# Patient Record
Sex: Female | Born: 1955 | Race: White | Hispanic: No | Marital: Married | Smoking: Never smoker
Health system: Southern US, Community
[De-identification: ages and names within clinical notes are randomized; demographics above are authoritative.]

## PROBLEM LIST (undated history)

## (undated) DIAGNOSIS — N390 Urinary tract infection, site not specified: Secondary | ICD-10-CM

## (undated) DIAGNOSIS — B2799 Infectious mononucleosis, unspecified with other complication: Secondary | ICD-10-CM

## (undated) DIAGNOSIS — M8580 Other specified disorders of bone density and structure, unspecified site: Secondary | ICD-10-CM

## (undated) DIAGNOSIS — B178 Other specified acute viral hepatitis: Secondary | ICD-10-CM

## (undated) DIAGNOSIS — Z780 Asymptomatic menopausal state: Secondary | ICD-10-CM

## (undated) DIAGNOSIS — Z86010 Personal history of colonic polyps: Secondary | ICD-10-CM

## (undated) DIAGNOSIS — K219 Gastro-esophageal reflux disease without esophagitis: Secondary | ICD-10-CM

## (undated) DIAGNOSIS — M816 Localized osteoporosis [Lequesne]: Secondary | ICD-10-CM

## (undated) DIAGNOSIS — E7800 Pure hypercholesterolemia, unspecified: Secondary | ICD-10-CM

## (undated) DIAGNOSIS — K5900 Constipation, unspecified: Secondary | ICD-10-CM

## (undated) DIAGNOSIS — I10 Essential (primary) hypertension: Secondary | ICD-10-CM

## (undated) DIAGNOSIS — E042 Nontoxic multinodular goiter: Secondary | ICD-10-CM

## (undated) DIAGNOSIS — J4520 Mild intermittent asthma, uncomplicated: Secondary | ICD-10-CM

## (undated) DIAGNOSIS — R911 Solitary pulmonary nodule: Secondary | ICD-10-CM

## (undated) DIAGNOSIS — M1990 Unspecified osteoarthritis, unspecified site: Secondary | ICD-10-CM

## (undated) DIAGNOSIS — K648 Other hemorrhoids: Secondary | ICD-10-CM

## (undated) DIAGNOSIS — C3490 Malignant neoplasm of unspecified part of unspecified bronchus or lung: Secondary | ICD-10-CM

## (undated) HISTORY — DX: Urinary tract infection, site not specified: N39.0

## (undated) HISTORY — DX: Pure hypercholesterolemia, unspecified: E78.00

## (undated) HISTORY — DX: Mild intermittent asthma, uncomplicated: J45.20

## (undated) HISTORY — PX: OTHER SURGICAL HISTORY: SHX169

## (undated) HISTORY — DX: Gastro-esophageal reflux disease without esophagitis: K21.9

## (undated) HISTORY — DX: Personal history of colonic polyps: Z86.010

## (undated) HISTORY — DX: Malignant neoplasm of unspecified part of unspecified bronchus or lung: C34.90

## (undated) HISTORY — DX: Essential (primary) hypertension: I10

## (undated) HISTORY — DX: Other specified acute viral hepatitis: B17.8

## (undated) HISTORY — PX: JOINT REPLACEMENT: SHX530

## (undated) HISTORY — DX: Infectious mononucleosis, unspecified with other complication: B27.99

## (undated) HISTORY — DX: Unspecified osteoarthritis, unspecified site: M19.90

## (undated) HISTORY — DX: Other hemorrhoids: K64.8

## (undated) HISTORY — PX: COLONOSCOPY W/ POLYPECTOMY: SHX1380

## (undated) HISTORY — DX: Solitary pulmonary nodule: R91.1

## (undated) HISTORY — DX: Other specified disorders of bone density and structure, unspecified site: M85.80

## (undated) HISTORY — PX: EYE SURGERY: SHX253

## (undated) HISTORY — DX: Asymptomatic menopausal state: Z78.0

## (undated) HISTORY — DX: Nontoxic multinodular goiter: E04.2

## (undated) HISTORY — DX: Constipation, unspecified: K59.00

## (undated) HISTORY — PX: HEMORRHOID BANDING: SHX5850

---

## 2000-02-14 HISTORY — PX: LASIK: SHX215

## 2004-02-14 HISTORY — PX: ENDOMETRIAL BIOPSY: SHX622

## 2010-04-14 DIAGNOSIS — Z780 Asymptomatic menopausal state: Secondary | ICD-10-CM

## 2010-04-14 HISTORY — DX: Asymptomatic menopausal state: Z78.0

## 2011-01-17 ENCOUNTER — Telehealth: Payer: Self-pay | Admitting: Family Medicine

## 2011-01-17 ENCOUNTER — Ambulatory Visit (INDEPENDENT_AMBULATORY_CARE_PROVIDER_SITE_OTHER): Payer: BC Managed Care – PPO | Admitting: Family Medicine

## 2011-01-17 ENCOUNTER — Encounter: Payer: Self-pay | Admitting: Family Medicine

## 2011-01-17 VITALS — BP 128/80 | HR 79 | Temp 98.0°F | Ht 65.75 in | Wt 170.0 lb

## 2011-01-17 DIAGNOSIS — J069 Acute upper respiratory infection, unspecified: Secondary | ICD-10-CM

## 2011-01-17 DIAGNOSIS — H6120 Impacted cerumen, unspecified ear: Secondary | ICD-10-CM

## 2011-01-17 MED ORDER — HYDROCODONE-HOMATROPINE 5-1.5 MG/5ML PO SYRP: ORAL_SOLUTION | ORAL | Status: DC

## 2011-01-17 NOTE — Assessment & Plan Note (Signed)
No sign of bacterial infection at this time. Trial of nonsedating antihistamine + decongestant like allegra D. May use OTC nasal saline spray or irrigation solution bid. May try off brand afrin prn for severe nasal congestion.  Therapeutic expectations and side effect profile of medication discussed today.  Patient's questions answered. Hycodan rx given for severe sx's, stressed importance of saving this for night time mostly, warned about possible sedation/impairment on this type of med. Call or return if not improving in 5-6 days.

## 2011-01-17 NOTE — Assessment & Plan Note (Signed)
Successfully removed with curette today.

## 2011-01-17 NOTE — Progress Notes (Signed)
Office Note 01/17/2011  CC:  Chief Complaint  Patient presents with  . Sinusitis    fever last night    HPI:  Andrea Newton is a 55 y.o. White female who is here to establish care and discuss respiratory symptoms. Patient's most recent primary MD: Dr. Christell Constant at [facility].  OB/GYN care at [facility] [place] Old records were not reviewed prior to or during today's visit.  Pt presents complaining of respiratory symptoms for 2  days.  Mostly nasal congestion/runny nose, sneezing, and "mild" PND cough.  Subjective fever last night.  Worst symptoms seems to be the headaches and excessive nasal congestion/mucous.  Had a bad day yesterday, felt like face/ears/head hurt a lot and had lots of pressure.   Lately the symptoms seem to be improving some (feels better this morning). No fevers, no wheezing, and no SOB.  No pain in face or teeth.  No significant HA.  ST mild at most.  Symptoms made worse by nothing.  Symptoms improved by sitting up, taking ibuprofen and tylenol. Smoker? no Recent sick contact? None known but she works in an Engineer, [employer]. Muscle or joint aches? no She did get flu vaccine this season (> 2 wks ago).  ROS: no n/v/d or abdominal pain.  No rash.  No neck stiffness.   +Mild fatigue.  +Mild appetite loss.   Past Medical History  Diagnosis Date  . Multiple thyroid nodules 2010 approx    Euthyroid.  Saw ENDO (Dr. Jimmye Norman).  . Infectious mononucleosis hepatitis college  . Osteoarthritis     knees>hips.  Takes no meds.    Past Surgical History  Procedure Date  . Lasik 2002    Family History  Problem Relation Age of Onset  . Arthritis Mother     Osteo (bilat hip replacements)  . Cancer Maternal Grandmother     ovarian  . Heart disease Maternal Grandmother   . Stroke Maternal Grandmother     History   Social History  . Marital Status: Married    Spouse Name: N/A    Number of Children: N/A  . Years of Education: N/A    Occupational History  . Not on file.   Social History Main Topics  . Smoking status: Never Smoker   . Smokeless tobacco: Never Used  . Alcohol Use: Yes  . Drug Use: No  . Sexually Active: Not on file   Other Topics Concern  . 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  No siblings.No exercise but "active".    Outpatient Encounter Prescriptions as of 01/17/2011  Medication Sig Dispense Refill  . cholecalciferol (VITAMIN D) 1000 UNITS tablet Take 1,000 Units by mouth daily.        Marland Kitchen HYDROcodone-homatropine (HYCODAN) 5-1.5 MG/5ML syrup 1-2 tsp po q6h prn for cough/cold/achiness  120 mL  0  . Multiple Vitamin (MULTIVITAMIN) tablet Take 1 tablet by mouth daily.        . phentermine 37.5 MG capsule Take 37.5 mg by mouth every morning.        . vitamin B-12 (CYANOCOBALAMIN) 1000 MCG tablet Take 1,000 mcg by mouth daily.          Allergies  Allergen Reactions  . Penicillins Rash    ROS Review of  Systems  Constitutional: Positive for fever, appetite change and fatigue.  HENT: Negative for sore throat.        See HPI  Eyes: Negative for discharge, itching and visual disturbance.  Respiratory: Positive for cough. Negative for chest tightness, shortness of breath and wheezing.   Cardiovascular: Negative for chest pain.  Gastrointestinal: Negative for nausea, vomiting, abdominal pain and diarrhea.  Genitourinary: Negative for dysuria.  Musculoskeletal: Negative for back pain and joint swelling.  Skin: Negative for rash.  Neurological: Positive for headaches. Negative for dizziness and weakness.  Hematological: Negative for adenopathy.  Psychiatric/Behavioral: Negative for confusion.    PE; Blood pressure 128/80, pulse 79, temperature 98 F  (36.7 C), temperature source Oral, height 5' 5.75" (1.67 m), weight 170 lb (77.111 kg), last menstrual period 02/13/2010, SpO2 97.00%. VS: noted--normal. Gen: alert, NAD, NONTOXIC APPEARING. HEENT: eyes without injection, drainage, or swelling.  Ears: EACs: right side with 95% cerumen impaction--cleared this successfully today with curette.  TM and canal normal.  Left EAC normal.   TMs with normal light reflex and landmarks.  Nose: No visible rhinorrhea or crusty exudate.  Mucosa is pink and not injected.  No focal lesion.  No purulent d/c.  Mild discomfort to palpation in maxillary sinus areas bilat.  No frontal sinus TTP.Marland Kitchen  No facial swelling.  Throat and mouth without focal lesion.  No pharyngial swelling, erythema, or exudate.   Neck: supple, no LAD.   LUNGS: CTA bilat, nonlabored resps.   CV: RRR, no m/r/g. EXT: no c/c/e SKIN: no rash  Pertinent labs:  none  ASSESSMENT AND PLAN:   New patient: obtain old records.  Viral URI No sign of bacterial infection at this time. Trial of nonsedating antihistamine + decongestant like allegra D. May use OTC nasal saline spray or irrigation solution bid. May try off brand afrin prn for severe nasal congestion.  Therapeutic expectations and side effect profile of medication discussed today.  Patient's questions answered. Hycodan rx given for severe sx's, stressed importance of saving this for night time mostly, warned about possible sedation/impairment on this type of med. Call or return if not improving in 5-6 days.   Cerumen impaction Successfully removed with curette today.      She gets annual screening labs, mammograms, and pap smears through her GYN (last pap 01/2010, last mammo 01/2010.   She is going through menopause: LMP 02/2010.   She'll get bone densitometry through her GYN (hasn't had one yet).  She is also planning on getting her first screening colonoscopy in 2013.  Return if symptoms worsen or fail to improve.

## 2011-01-17 NOTE — Patient Instructions (Signed)
Buy store brand/generic allegra D OR Zyrtec D OR claritin D and take as directed. Use OTC nasal saline spray several times per day to clear nose out. Use generic afrin (oxymetazalone) at night for nasal congestion. Call or return if not improving in 5-6 days.

## 2011-01-17 NOTE — Telephone Encounter (Signed)
Pls request records from [facility] (Dr. Christell Constant) and from [facility] in [place]  Thx.

## 2011-01-25 ENCOUNTER — Encounter: Payer: Self-pay | Admitting: Family Medicine

## 2011-01-26 ENCOUNTER — Telehealth: Payer: Self-pay | Admitting: Family Medicine

## 2011-01-26 ENCOUNTER — Encounter: Payer: Self-pay | Admitting: Family Medicine

## 2011-01-26 ENCOUNTER — Ambulatory Visit (INDEPENDENT_AMBULATORY_CARE_PROVIDER_SITE_OTHER): Payer: BC Managed Care – PPO | Admitting: Family Medicine

## 2011-01-26 VITALS — BP 135/83 | HR 82 | Temp 98.1°F | Resp 16

## 2011-01-26 DIAGNOSIS — J209 Acute bronchitis, unspecified: Secondary | ICD-10-CM

## 2011-01-26 MED ORDER — HYDROCODONE-HOMATROPINE 5-1.5 MG/5ML PO SYRP: ORAL_SOLUTION | ORAL | Status: AC

## 2011-01-26 MED ORDER — ALBUTEROL SULFATE (2.5 MG/3ML) 0.083% IN NEBU
2.5000 mg | INHALATION_SOLUTION | RESPIRATORY_TRACT | Status: AC
  Administered 2011-01-26: 2.5 mg via RESPIRATORY_TRACT

## 2011-01-26 MED ORDER — AZITHROMYCIN 250 MG PO TABS: ORAL_TABLET | ORAL | Status: DC

## 2011-01-26 MED ORDER — ALBUTEROL SULFATE (2.5 MG/3ML) 0.083% IN NEBU: 2.5000 mg | INHALATION_SOLUTION | Freq: Four times a day (QID) | RESPIRATORY_TRACT | Status: DC | PRN

## 2011-01-26 MED ORDER — PREDNISONE 20 MG PO TABS: ORAL_TABLET | ORAL | Status: DC

## 2011-01-26 NOTE — Telephone Encounter (Signed)
RC to pt.  She states she has been doing all the things recommended to her last week.  She began to feel better, but since Sunday she is feeling worse.  She has been hoarse or no voice since Sunday.  She has productive cough with yellow phlegm.  Chest tightness and hurts occasionally.  ? Fever on Sunday.  OK to call in antibiotics or does patient need office visit.  Please advise.

## 2011-01-26 NOTE — Telephone Encounter (Signed)
Patient is coughing a lot and hasn't had much of a voice for 3 days, she feels like this has moved into her chest, is there anything else she can take?

## 2011-01-26 NOTE — Progress Notes (Signed)
OFFICE NOTE  01/26/2011  CC:  Chief Complaint  Patient presents with  . URI    Productive Cough x 3-4 days. Sudafed and mucinex not helping.     HPI: Patient is a 55 y.o. Caucasian female who is here for ongoing respiratory illness. About 2 wks of symptoms now.  Initially was more upper resp, now this is much improved and she has felt worsening chest tightness, cough, lost her voice, some question of fevers vs postmenopausal hot flashes.  Mild upset stomach-?from meds?.  No vomiting, no diarrhea.   No rash.  No ST.  Mild HA from excessive coughing.   She doesn't smoke.     Pertinent PMH:  No hx of asthma or COPD She did get her flu vaccine this season.  Pertinent Meds: Hycodan susp prn, nonsedating antihist OTC +decong, nasal sprays  PE: Blood pressure 135/83, pulse 82, temperature 98.1 F (36.7 C), temperature source Oral, resp. rate 16, last menstrual period 02/13/2010, SpO2 97.00%. VS: noted--normal. Gen: alert, NAD, NONTOXIC APPEARING. HEENT: eyes without injection, drainage, or swelling.  Ears: EACs clear, TMs with normal light reflex and landmarks.  Nose: Clear rhinorrhea, with some dried, crusty exudate adherent to mildly injected mucosa.  No purulent d/c.  No paranasal sinus TTP.  No facial swelling.  Throat and mouth without focal lesion.  No pharyngial swelling, erythema, or exudate.   Neck: supple, no LAD.   LUNGS: CTA bilat, nonlabored resps.  Diminished aeration diffusely, with mild post-exhalation coughing.  No crackles and no wheezing heard. CV: RRR, no m/r/g. EXT: no c/c/e SKIN: no rash    IMPRESSION AND PLAN: Laryngotracheobronchitis. Albut neb 2.5mg  in office today: mildly helpful after 10-15 min. Plan is for albut 2.5mg  neb q6h prn (has neb machine at home for son), prednisone 40mg  qd x 5d, and z-pack. We discussed the uncertainty of viral vs atypical bacterial infection in illnesses like these, and ultimately decided to give the z-pack rx to hold and  fill if she wasn't feeling signif improvement in a few days on the steroids/nebs alone. Hycodan syrup RFx1. Therapeutic expectations and side effect profile of medication discussed today.  Patient's questions answered.   FOLLOW UP: prn

## 2011-01-26 NOTE — Telephone Encounter (Signed)
Please call pt and encourage office visit for recheck.  Thx

## 2011-01-26 NOTE — Telephone Encounter (Signed)
Pt notified and will come in today at 3:15.

## 2012-10-23 ENCOUNTER — Ambulatory Visit: Payer: BC Managed Care – PPO | Admitting: Family Medicine

## 2012-10-24 ENCOUNTER — Ambulatory Visit: Payer: BC Managed Care – PPO | Admitting: Family Medicine

## 2012-10-24 ENCOUNTER — Encounter: Payer: Self-pay | Admitting: Nurse Practitioner

## 2012-10-24 ENCOUNTER — Ambulatory Visit (INDEPENDENT_AMBULATORY_CARE_PROVIDER_SITE_OTHER): Payer: BC Managed Care – PPO | Admitting: Nurse Practitioner

## 2012-10-24 VITALS — BP 144/81 | HR 84 | Temp 99.5°F | Resp 16 | Ht 67.5 in | Wt 175.0 lb

## 2012-10-24 DIAGNOSIS — R5381 Other malaise: Secondary | ICD-10-CM

## 2012-10-24 DIAGNOSIS — E049 Nontoxic goiter, unspecified: Secondary | ICD-10-CM

## 2012-10-24 LAB — CBC
MCH: 30.1 pg (ref 26.0–34.0)
MCHC: 33.2 g/dL (ref 30.0–36.0)
Platelets: 275 10*3/uL (ref 150–400)

## 2012-10-24 NOTE — Progress Notes (Signed)
Subjective:     Andrea Newton is a 57 y.o. female who presents for evaluation of fatigue. Symptoms began several days ago. The patient feels the fatigue may be associated to not sleeping well, going back to work, mother's death-1 year anniversary coming up. Symptoms of her fatigue have been general malaise and difficulty getting through the day. Patient describes the following psychological symptoms: stress at work, stress in the family and stress related to recent death of loved one. Patient denies change in hair texture, cold intolerance, constipation, fever and GI blood loss. Possibly associated: loose stools this week. 15 pound weight gain in last year. Symptoms have been persistent this week.. Symptom severity: struggles to carry out day to day responsibilities.. Previous visits for this problem: none.   The following portions of the patient's history were reviewed and updated as appropriate: allergies, current medications, past family history, past medical history, past social history and problem list.  Review of Systems Constitutional: positive for malaise, negative for anorexia, fevers, night sweats and weight loss Eyes: negative for visual disturbance Ears, nose, mouth, throat, and face: negative for nasal congestion and sore throat Respiratory: negative for cough, dyspnea on exertion, pleurisy/chest pain, sputum and wheezing Cardiovascular: negative for chest pain, chest pressure/discomfort, fatigue, irregular heart beat, lower extremity edema and near-syncope Gastrointestinal: positive for loose stools this week, negative for abdominal pain and reflux symptoms Behavioral/Psych: positive for anxiety, depression, increased appetite and sleep disturbance, negative for bad mood, excessive alcohol consumption, irritability and tobacco use Endocrine: positive for history of goiter, negative for temperature intolerance    Objective:    BP 144/81  Pulse 84  Temp(Src) 99.5 F (37.5 C)  (Temporal)  Resp 16  Ht 5' 7.5" (1.715 m)  Wt 175 lb (79.379 kg)  BMI 26.99 kg/m2  SpO2 98%  LMP 02/13/2010 General appearance: alert, cooperative, appears stated age and no distress Head: Normocephalic, without obvious abnormality, atraumatic Eyes: negative findings: lids and lashes normal, conjunctivae and sclerae normal and corneas clear Throat: lips, mucosa, and tongue normal; teeth and gums normal Neck: no adenopathy, no carotid bruit, supple, symmetrical, trachea midline and thyroid: enlarged Lungs: clear to auscultation bilaterally Heart: regular rate and rhythm, S1, S2 normal, no murmur, click, rub or gallop Lymph nodes: Cervical, supraclavicular, and axillary nodes normal.    Assessment:   Fatigue, possible organic cause-has goiter. May be r/t stress of going back to work, not sleeping well, anxious about family events coming up including 1 year anniversary of mother's death. Insomnia, melatonin not effective   Plan:   Labs pending (check thyroid, blood count, renal & hepatic, screen for vit D deficiency, DM, lipids) Sleep hygiene, Tylenol PM PRN

## 2012-10-24 NOTE — Patient Instructions (Addendum)
Your labs will help [place] determine what is causing fatigue. If it is not physical, certainly consider stress and grief reactions. I will call you regarding your results. Results will determine follow up. Make sure you are hydrated throughout the day-sip every hour. Liquid vitamin B complex can help with energy. You can take 1 or 2 droppers full daily. Pleasure to meet you!

## 2012-10-25 LAB — HEPATIC FUNCTION PANEL
ALT: 26 U/L (ref 0–35)
Albumin: 4.4 g/dL (ref 3.5–5.2)
Indirect Bilirubin: 0.2 mg/dL (ref 0.0–0.9)
Total Protein: 7.1 g/dL (ref 6.0–8.3)

## 2012-10-25 LAB — LIPID PANEL
Cholesterol: 188 mg/dL (ref 0–200)
HDL: 53 mg/dL (ref >39)
Total CHOL/HDL Ratio: 3.5 Ratio
Triglycerides: 102 mg/dL (ref <150)
VLDL: 20 mg/dL (ref 0–40)

## 2012-10-25 LAB — TSH: TSH: 3.644 u[IU]/mL (ref 0.350–4.500)

## 2012-10-25 LAB — RENAL FUNCTION PANEL
BUN: 14 mg/dL (ref 6–23)
Calcium: 9.5 mg/dL (ref 8.4–10.5)
Creat: 0.6 mg/dL (ref 0.50–1.10)
Glucose, Bld: 92 mg/dL (ref 70–99)
Phosphorus: 3.4 mg/dL (ref 2.3–4.6)
Potassium: 4.4 mEq/L (ref 3.5–5.3)

## 2012-10-25 LAB — HEMOGLOBIN A1C: Mean Plasma Glucose: 111 mg/dL (ref <117)

## 2012-10-28 ENCOUNTER — Telehealth: Payer: Self-pay | Admitting: Nurse Practitioner

## 2012-10-28 DIAGNOSIS — G4700 Insomnia, unspecified: Secondary | ICD-10-CM

## 2012-10-28 MED ORDER — DOXEPIN HCL 3 MG PO TABS: 1.0000 | ORAL_TABLET | Freq: Every evening | ORAL | Status: DC | PRN

## 2012-10-28 NOTE — Telephone Encounter (Signed)
Will try doxepin for sleep maintenance. All labs normal. Vit D slightly low-will start D3 5000 iu daily. F/u in 4-6 weeks. Part of fatigue may be r/t no taking phentermine any longer.

## 2012-10-28 NOTE — Telephone Encounter (Signed)
All labs normal w/exception of Vit D. Will start D3 5000 iu daily. Will discuss sleep quality to improve fatigue.

## 2012-10-28 NOTE — Telephone Encounter (Signed)
patinet .stated that it would be fine to call her back around 4 or after. Call cell phone [phone].

## 2012-11-04 ENCOUNTER — Other Ambulatory Visit: Payer: Self-pay | Admitting: Nurse Practitioner

## 2012-11-04 DIAGNOSIS — G4700 Insomnia, unspecified: Secondary | ICD-10-CM

## 2012-11-04 MED ORDER — ZOLPIDEM TARTRATE 5 MG PO TABS: 5.0000 mg | ORAL_TABLET | Freq: Every evening | ORAL | Status: DC | PRN

## 2012-11-05 NOTE — Telephone Encounter (Signed)
LMOVM for patient to return call.

## 2012-11-11 NOTE — Telephone Encounter (Signed)
Called patient to schedule follow-up appointment. Patient stated that she will call and schedule follow-up later. Patient stated that she is feeling better since she started taking vitamin D. Patient has not started that the doxepin.

## 2013-03-11 HISTORY — PX: OTHER SURGICAL HISTORY: SHX169

## 2013-05-21 ENCOUNTER — Encounter: Payer: Self-pay | Admitting: Family Medicine

## 2013-05-21 ENCOUNTER — Ambulatory Visit (INDEPENDENT_AMBULATORY_CARE_PROVIDER_SITE_OTHER): Payer: BC Managed Care – PPO | Admitting: Family Medicine

## 2013-05-21 VITALS — BP 125/83 | HR 79 | Temp 99.2°F | Resp 18 | Ht 67.5 in | Wt 174.0 lb

## 2013-05-21 DIAGNOSIS — R300 Dysuria: Secondary | ICD-10-CM

## 2013-05-21 DIAGNOSIS — N390 Urinary tract infection, site not specified: Secondary | ICD-10-CM

## 2013-05-21 DIAGNOSIS — G4700 Insomnia, unspecified: Secondary | ICD-10-CM

## 2013-05-21 LAB — POCT URINALYSIS DIPSTICK
BILIRUBIN UA: NEGATIVE
GLUCOSE UA: NEGATIVE
Ketones, UA: NEGATIVE
NITRITE UA: POSITIVE
Spec Grav, UA: 1.02
Urobilinogen, UA: 0.2
pH, UA: 6.5

## 2013-05-21 MED ORDER — ZOLPIDEM TARTRATE 5 MG PO TABS: 5.0000 mg | ORAL_TABLET | Freq: Every evening | ORAL | Status: DC | PRN

## 2013-05-21 MED ORDER — CIPROFLOXACIN HCL 500 MG PO TABS: 500.0000 mg | ORAL_TABLET | Freq: Two times a day (BID) | ORAL | Status: DC

## 2013-05-21 NOTE — Progress Notes (Signed)
Pre visit review using our clinic review tool, if applicable. No additional management support is needed unless otherwise documented below in the visit note. 

## 2013-05-21 NOTE — Progress Notes (Signed)
OFFICE NOTE  05/21/2013  CC:  Chief Complaint  Patient presents with  . Dysuria    [facility]  . Medication Refill   HPI: Patient is a 58 y.o. Caucasian female who is here for f/u recent UTI dx'd at [facility]---she thinks may not be fully gone.. Onset about 9-10d urinary urgency, bladder pressure.   Was rx'd nitrofurant and sx's improved but never resolved, and now sx's slowly getting worse.  No nausea, no fevers. Took AZO last night.   Pertinent PMH:  Past medical, surgical, social, and family history reviewed and no changes are noted since last office visit.  MEDS:  Outpatient Prescriptions Prior to Visit  Medication Sig Dispense Refill  . cholecalciferol (VITAMIN D) 1000 UNITS tablet Take 1,000 Units by mouth daily.        . phentermine 37.5 MG capsule Take 37.5 mg by mouth every morning.        . vitamin B-12 (CYANOCOBALAMIN) 1000 MCG tablet Take 1,000 mcg by mouth daily.        Marland Kitchen. zolpidem (AMBIEN) 5 MG tablet Take 1 tablet (5 mg total) by mouth at bedtime as needed for sleep.  15 tablet  1  . Multiple Vitamin (MULTIVITAMIN) tablet Take 1 tablet by mouth daily.         No facility-administered medications prior to visit.    PE: Blood pressure 125/83, pulse 79, temperature 99.2 F (37.3 C), temperature source Temporal, resp. rate 18, height 5' 7.5" (1.715 m), weight 174 lb (78.926 kg), last menstrual period 02/13/2010, SpO2 100.00%. Gen: Alert, well appearing.  Patient is oriented to person, place, time, and situation. No further exam today.  UA: mod blood, nit pos, LEU large  IMPRESSION AND PLAN:  UTI, lingering sx's s/p nitrofurantoin course. UA today abnl but pt took AZO last night. Sent urine for c/s. Cipro 500mg  bid x 5d.  RF'd pt's ambien today, 5mg , #30, RF x 5.  FOLLOW UP: prn

## 2013-05-23 LAB — URINE CULTURE

## 2013-09-15 LAB — HM MAMMOGRAPHY

## 2013-12-29 ENCOUNTER — Telehealth: Payer: Self-pay | Admitting: Family Medicine

## 2013-12-29 MED ORDER — CIPROFLOXACIN HCL 500 MG PO TABS: 500.0000 mg | ORAL_TABLET | Freq: Two times a day (BID) | ORAL | Status: DC

## 2013-12-29 NOTE — Telephone Encounter (Signed)
OK to send in generic cipro 500 mg, 1 tab po bid x 3d, #6, no RF.  If not well with this treatment then needs to make o/v to be seen.-thx

## 2013-12-29 NOTE — Telephone Encounter (Signed)
Patient Information:  Caller Name: Johnny BridgeMartha  Phone: [phone]  Patient: Elveria RoyalsChristopher, Sheridan E  Gender: Female  DOB: 13-Jul-1955  Age: 58 Years  PCP: Earley FavorMcGowen, [facility](Family Practice)  Office Follow Up:  Does the office need to follow up with this patient?: No  Instructions For The Office: N/A  RN Note:  Called office and spoke to WestbrookKamiya who advised that she will get the message to Dr. Milinda CaveMcGowen to follow-up with pt. Pt aware that office will call her back soon.  Symptoms  Reason For Call & Symptoms: Calling about urinary urgency and frequency, sli achiness in side. Had a UTI back in April and sxs are the same. Pt called the office earlier and sent a MyChart message but no one has gotten back to her.  Reviewed Health History In EMR: Yes  Reviewed Medications In EMR: Yes  Reviewed Allergies In EMR: Yes  Reviewed Surgeries / Procedures: Yes  Date of Onset of Symptoms: 12/29/2013  Treatments Tried: Uristat  Treatments Tried Worked: No  Guideline(s) Used:  Urination Pain - Female  Disposition Per Guideline:   Go to Office Now  Reason For Disposition Reached:   Side (flank) or lower back pain present  Advice Given:  Fluids:   Drink extra fluids. Drink 8-10 glasses of liquids a day (Reason: to produce a dilute, non-irritating urine).  Call Back If:  You become worse.  Patient Will Follow Care Advice:  YES

## 2013-12-29 NOTE — Telephone Encounter (Signed)
Patient Information:  Caller Name: Johnny BridgeMartha  Phone: [phone]  Patient: Andrea Newton, Andrea Newton  Gender: Female  DOB: 1955/08/15  Age: 58 Years  PCP: Earley FavorMcGowen, [facility](Family Practice)  Office Follow Up:  Does the office need to follow up with this patient?: Yes  Instructions For The Office: Please f/u with pt concerning Rx for UTI symptoms, thank you.   Symptoms  Reason For Call & Symptoms: Pt reports UTI symptoms, urination frequency, urgency.  Reviewed Health History In EMR: Yes  Reviewed Medications In EMR: Yes  Reviewed Allergies In EMR: Yes  Reviewed Surgeries / Procedures: Yes  Date of Onset of Symptoms: 12/29/2013  Treatments Tried: Urostat  Treatments Tried Worked: Yes  Guideline(s) Used:  Urination Pain - Female  Disposition Per Guideline:   Go to Office Now  Reason For Disposition Reached:   Side (flank) or lower back pain present  Advice Given:  Call Back If:  You become worse.  Patient Refused Recommendation:  Patient Requests Prescription  Pt requesting Rc called in for UTI symptoms.

## 2013-12-29 NOTE — Telephone Encounter (Signed)
Please advise 

## 2013-12-29 NOTE — Telephone Encounter (Signed)
Patient aware Rx sent into pharmacy  

## 2013-12-31 ENCOUNTER — Other Ambulatory Visit (INDEPENDENT_AMBULATORY_CARE_PROVIDER_SITE_OTHER): Payer: BC Managed Care – PPO

## 2013-12-31 DIAGNOSIS — Z0000 Encounter for general adult medical examination without abnormal findings: Secondary | ICD-10-CM

## 2013-12-31 LAB — COMPREHENSIVE METABOLIC PANEL
ALBUMIN: 4.1 g/dL (ref 3.5–5.2)
ALT: 19 U/L (ref 0–35)
AST: 18 U/L (ref 0–37)
Alkaline Phosphatase: 74 U/L (ref 39–117)
BUN: 21 mg/dL (ref 6–23)
CHLORIDE: 104 meq/L (ref 96–112)
CO2: 31 meq/L (ref 19–32)
CREATININE: 0.6 mg/dL (ref 0.4–1.2)
Calcium: 9.1 mg/dL (ref 8.4–10.5)
GFR: 101.94 mL/min (ref >60.00)
Glucose, Bld: 109 mg/dL — ABNORMAL HIGH (ref 70–99)
POTASSIUM: 4.2 meq/L (ref 3.5–5.1)
SODIUM: 142 meq/L (ref 135–145)
TOTAL PROTEIN: 6.9 g/dL (ref 6.0–8.3)
Total Bilirubin: 0.7 mg/dL (ref 0.2–1.2)

## 2013-12-31 LAB — CBC WITH DIFFERENTIAL/PLATELET
BASOS ABS: 0.1 10*3/uL (ref 0.0–0.1)
Basophils Relative: 1.7 % (ref 0.0–3.0)
EOS ABS: 0.1 10*3/uL (ref 0.0–0.7)
Eosinophils Relative: 2.4 % (ref 0.0–5.0)
HCT: 39.4 % (ref 36.0–46.0)
Hemoglobin: 12.9 g/dL (ref 12.0–15.0)
LYMPHS PCT: 31.3 % (ref 12.0–46.0)
Lymphs Abs: 1.7 10*3/uL (ref 0.7–4.0)
MCHC: 32.8 g/dL (ref 30.0–36.0)
MCV: 92.4 fl (ref 78.0–100.0)
MONOS PCT: 4.9 % (ref 3.0–12.0)
Monocytes Absolute: 0.3 10*3/uL (ref 0.1–1.0)
NEUTROS ABS: 3.2 10*3/uL (ref 1.4–7.7)
NEUTROS PCT: 59.7 % (ref 43.0–77.0)
Platelets: 208 10*3/uL (ref 150.0–400.0)
RBC: 4.26 Mil/uL (ref 3.87–5.11)
RDW: 13.8 % (ref 11.5–15.5)
WBC: 5.4 10*3/uL (ref 4.0–10.5)

## 2013-12-31 LAB — LIPID PANEL
Cholesterol: 210 mg/dL — ABNORMAL HIGH (ref 0–200)
HDL: 53.4 mg/dL (ref >39.00)
LDL Cholesterol: 142 mg/dL — ABNORMAL HIGH (ref 0–99)
NonHDL: 156.6
Total CHOL/HDL Ratio: 4
Triglycerides: 73 mg/dL (ref 0.0–149.0)
VLDL: 14.6 mg/dL (ref 0.0–40.0)

## 2013-12-31 LAB — TSH: TSH: 3.41 u[IU]/mL (ref 0.35–4.50)

## 2014-01-02 ENCOUNTER — Encounter: Payer: Self-pay | Admitting: Family Medicine

## 2014-01-02 ENCOUNTER — Ambulatory Visit (INDEPENDENT_AMBULATORY_CARE_PROVIDER_SITE_OTHER): Payer: BC Managed Care – PPO | Admitting: Family Medicine

## 2014-01-02 VITALS — BP 131/85 | HR 73 | Temp 99.0°F | Resp 16 | Ht 67.5 in | Wt 168.0 lb

## 2014-01-02 DIAGNOSIS — G4700 Insomnia, unspecified: Secondary | ICD-10-CM

## 2014-01-02 DIAGNOSIS — R7301 Impaired fasting glucose: Secondary | ICD-10-CM

## 2014-01-02 DIAGNOSIS — N3000 Acute cystitis without hematuria: Secondary | ICD-10-CM

## 2014-01-02 DIAGNOSIS — Z23 Encounter for immunization: Secondary | ICD-10-CM

## 2014-01-02 DIAGNOSIS — Z0000 Encounter for general adult medical examination without abnormal findings: Secondary | ICD-10-CM | POA: Insufficient documentation

## 2014-01-02 DIAGNOSIS — E559 Vitamin D deficiency, unspecified: Secondary | ICD-10-CM

## 2014-01-02 DIAGNOSIS — E785 Hyperlipidemia, unspecified: Secondary | ICD-10-CM

## 2014-01-02 LAB — HEMOGLOBIN A1C: HEMOGLOBIN A1C: 5.6 % (ref 4.6–6.5)

## 2014-01-02 LAB — VITAMIN D 25 HYDROXY (VIT D DEFICIENCY, FRACTURES): VITD: 69.63 ng/mL (ref 30.00–100.00)

## 2014-01-02 MED ORDER — ZOLPIDEM TARTRATE 5 MG PO TABS: 5.0000 mg | ORAL_TABLET | Freq: Every evening | ORAL | Status: DC | PRN

## 2014-01-02 MED ORDER — CIPROFLOXACIN HCL 500 MG PO TABS: 500.0000 mg | ORAL_TABLET | Freq: Two times a day (BID) | ORAL | Status: DC

## 2014-01-02 NOTE — Assessment & Plan Note (Signed)
Reviewed age and gender appropriate health maintenance issues (prudent diet, regular exercise, health risks of tobacco and excessive alcohol, use of seatbelts, fire alarms in home, use of sunscreen).  Also reviewed age and gender appropriate health screening as well as vaccine recommendations. Tdap and flu IM today. Pap and mammogram UTD via GYN. Colon cancer screening UTd, next colonoscopy due in 2 yrs: digestive health specialists (hx of adenomatous polyps on TCS 3 yrs ago).

## 2014-01-02 NOTE — Progress Notes (Signed)
Pre visit review using our clinic review tool, if applicable. No additional management support is needed unless otherwise documented below in the visit note. 

## 2014-01-02 NOTE — Progress Notes (Addendum)
Office Note 01/02/2014  CC:  Chief Complaint  Patient presents with  . Annual Exam   HPI:  Andrea Newton is a 58 y.o. White female who is here for CPE. Reviewed recent fasting lab work in detail today. She's feeling pretty well.   Past Medical History  Diagnosis Date  . Multiple thyroid nodules 2010 approx    Euthyroid.  Saw ENDO (Dr. Jimmye NormanFarmer).  . Infectious mononucleosis hepatitis college  . Osteoarthritis     knees>hips.  Takes no meds.  . Postmenopausal status 04/2010    FSH and LH elevated, estrogen low.    Past Surgical History  Procedure Laterality Date  . Lasik  2002  . Endometrial biopsy  2006    Performed b/c of abnormal uterine bleeding: normal (fragmented INACTIVE endometrium).  [facility] [place].    Family History  Problem Relation Age of Onset  . Arthritis Mother     Osteo (bilat hip replacements)  . Cancer Maternal Grandmother     ovarian  . Heart disease Maternal Grandmother   . Stroke Maternal Grandmother     History   Social History  . Marital Status: Married    Spouse Name: N/A    Number of Children: N/A  . Years of Education: N/A   Occupational History  . Not on file.   Social History Main Topics  . Smoking status: Never Smoker   . Smokeless tobacco: Never Used  . Alcohol Use: Yes  . Drug Use: No  . Sexual Activity: Not on file   Other Topics Concern  . Not on file   Social History Narrative   Married, 2 children (son Cristal DeerChristopher in his 3920s, daughter age 56 at [employer]).   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   [place] [address], [place] farm [place], [place].   No tobacco, rare alcohol, no drugs.  No siblings.   No exercise but "active".    Outpatient Prescriptions Prior to Visit  Medication Sig Dispense Refill  . cholecalciferol (VITAMIN D) 1000 UNITS tablet Take 1,000 Units by mouth daily.      . phentermine 37.5 MG capsule Take 37.5 mg by mouth every  morning.      . vitamin B-12 (CYANOCOBALAMIN) 1000 MCG tablet Take 1,000 mcg by mouth daily.      . Multiple Vitamin (MULTIVITAMIN) tablet Take 1 tablet by mouth daily.      . ciprofloxacin (CIPRO) 500 MG tablet Take 1 tablet (500 mg total) by mouth 2 (two) times daily. 6 tablet 0  . zolpidem (AMBIEN) 5 MG tablet Take 1 tablet (5 mg total) by mouth at bedtime as needed for sleep. 30 tablet 5   No facility-administered medications prior to visit.    Allergies  Allergen Reactions  . Penicillins Rash    ROS Review of Systems  Constitutional: Negative for fever, chills, appetite change and fatigue.  HENT: Negative for congestion, dental problem, ear pain and sore throat.   Eyes: Negative for discharge, redness and visual disturbance.  Respiratory: Negative for cough, chest tightness, shortness of breath and wheezing.   Cardiovascular: Negative for chest pain, palpitations and leg swelling.  Gastrointestinal: Negative for nausea, vomiting, abdominal pain, diarrhea (has 1-4 stools per day, mostly formed) and blood in stool.  Genitourinary: Negative for dysuria, urgency, frequency, hematuria, flank pain and difficulty urinating.  Musculoskeletal: Negative for myalgias, back pain, joint swelling, arthralgias and neck stiffness.  Skin: Negative for pallor and rash.  Neurological:  Negative for dizziness, speech difficulty, weakness and headaches.  Hematological: Negative for adenopathy. Does not bruise/bleed easily.  Psychiatric/Behavioral: Negative for confusion and sleep disturbance. The patient is not nervous/anxious.     PE; Blood pressure 131/85, pulse 73, temperature 99 F (37.2 C), temperature source Temporal, resp. rate 16, height 5' 7.5" (1.715 m), weight 168 lb (76.204 kg), last menstrual period 02/13/2010, SpO2 100 %. Gen: Alert, well appearing.  Patient is oriented to person, place, time, and situation. AFFECT: pleasant, lucid thought and speech. ENT: Ears: EACs clear, normal  epithelium.  TMs with good light reflex and landmarks bilaterally.  Eyes: no injection, icteris, swelling, or exudate.  EOMI, PERRLA. Nose: no drainage or turbinate edema/swelling.  No injection or focal lesion.  Mouth: lips without lesion/swelling.  Oral mucosa pink and moist.  Dentition intact and without obvious caries or gingival swelling.  Oropharynx without erythema, exudate, or swelling.  Neck: supple/nontender.  No LAD or mass.  Thyroid feels diffusely enlarged without palpable nodularity or tenderness.  Carotid pulses 2+ bilaterally, without bruits. CV: RRR, no m/r/g.   LUNGS: CTA bilat, nonlabored resps, good aeration in all lung fields. ABD: soft, NT, ND, BS normal.  No hepatospenomegaly or mass.  No bruits. EXT: no clubbing, cyanosis, or edema.  Musculoskeletal: no joint swelling, erythema, warmth, or tenderness.  ROM of all joints intact. Skin - no sores or suspicious lesions or rashes or color changes  Pertinent labs:  Lab Results  Component Value Date   WBC 5.4 12/31/2013   HGB 12.9 12/31/2013   HCT 39.4 12/31/2013   MCV 92.4 12/31/2013   PLT 208.0 12/31/2013   Lab Results  Component Value Date   TSH 3.41 12/31/2013     Chemistry      Component Value Date/Time   NA 142 12/31/2013 0838   K 4.2 12/31/2013 0838   CL 104 12/31/2013 0838   CO2 31 12/31/2013 0838   BUN 21 12/31/2013 0838   CREATININE 0.6 12/31/2013 0838   CREATININE 0.60 10/24/2012 1613      Component Value Date/Time   CALCIUM 9.1 12/31/2013 0838   ALKPHOS 74 12/31/2013 0838   AST 18 12/31/2013 0838   ALT 19 12/31/2013 0838   BILITOT 0.7 12/31/2013 0838     Lab Results  Component Value Date   CHOL 210* 12/31/2013   HDL 53.40 12/31/2013   LDLCALC 142* 12/31/2013   TRIG 73.0 12/31/2013   CHOLHDL 4 12/31/2013   Glucose (fasting) 109 on 12/31/13   ASSESSMENT AND PLAN:   Impaired fasting glucose Check HbA1c today. Discussed diet/exercise/wt loss.  Borderline hyperlipidemia Discussed  diet/wt loss/exercise. Recheck FLP 6 mo.  Vitamin D deficiency I believe she was given a "boost" dosing of vit D based on symptom of low energy, but she seems to recall a low Vit D level at some point in the past. Will check vit D level today to see if she needs to keep taking 5000 U qd of vit D or see if she can back down off this dose at this time.  UTI (urinary tract infection) Sx's resolved. Will give cipro rx to hold and fill if sx's develop at a time when we are closed. Emphasized the need to come in, though, if she develops symptoms during out regular office hours.  Health maintenance examination Reviewed age and gender appropriate health maintenance issues (prudent diet, regular exercise, health risks of tobacco and excessive alcohol, use of seatbelts, fire alarms in home, use of sunscreen).  Also  reviewed age and gender appropriate health screening as well as vaccine recommendations. Tdap and flu IM today. Pap and mammogram UTD via GYN. Colon cancer screening UTd, next colonoscopy due in 2 yrs: digestive health specialists (hx of adenomatous polyps on TCS 3 yrs ago).   An After Visit Summary was printed and given to the patient.  FOLLOW UP:  Return in about 6 months (around 07/03/2014) for f/u impaired fasting glucose and borderline hyperlipidemia.

## 2014-01-02 NOTE — Patient Instructions (Signed)
Calcium 500mg , 1 tab three times a day. Vit D minimum of 800 U/day.

## 2014-01-02 NOTE — Assessment & Plan Note (Signed)
Check HbA1c today. Discussed diet/exercise/wt loss.

## 2014-01-02 NOTE — Assessment & Plan Note (Signed)
I believe she was given a "boost" dosing of vit D based on symptom of low energy, but she seems to recall a low Vit D level at some point in the past. Will check vit D level today to see if she needs to keep taking 5000 U qd of vit D or see if she can back down off this dose at this time.

## 2014-01-02 NOTE — Assessment & Plan Note (Signed)
Sx's resolved. Will give cipro rx to hold and fill if sx's develop at a time when we are closed. Emphasized the need to come in, though, if she develops symptoms during out regular office hours.

## 2014-01-02 NOTE — Assessment & Plan Note (Signed)
Discussed diet/wt loss/exercise. Recheck FLP 6 mo.

## 2014-01-04 ENCOUNTER — Telehealth: Payer: Self-pay | Admitting: Family Medicine

## 2014-01-04 NOTE — Telephone Encounter (Signed)
A user error has taken place: encounter opened in error, closed for administrative reasons.

## 2014-01-06 ENCOUNTER — Other Ambulatory Visit: Payer: Self-pay | Admitting: Family Medicine

## 2014-01-06 DIAGNOSIS — E042 Nontoxic multinodular goiter: Secondary | ICD-10-CM

## 2014-01-13 ENCOUNTER — Encounter: Payer: Self-pay | Admitting: Family Medicine

## 2014-01-13 ENCOUNTER — Ambulatory Visit (INDEPENDENT_AMBULATORY_CARE_PROVIDER_SITE_OTHER): Payer: BC Managed Care – PPO

## 2014-01-13 DIAGNOSIS — E010 Iodine-deficiency related diffuse (endemic) goiter: Secondary | ICD-10-CM

## 2014-01-13 DIAGNOSIS — E042 Nontoxic multinodular goiter: Secondary | ICD-10-CM

## 2014-02-13 DIAGNOSIS — K648 Other hemorrhoids: Secondary | ICD-10-CM

## 2014-02-13 HISTORY — DX: Other hemorrhoids: K64.8

## 2014-02-27 ENCOUNTER — Telehealth: Payer: Self-pay

## 2014-02-27 NOTE — Telephone Encounter (Signed)
Pt is requesting to [place] [facility] because her appt with [facility] is not until March 8 and she is wanting to get in somewhere sooner. Can I put in another referral for her. I tried to edit the previous referral but was unable. Please advise.

## 2014-02-28 ENCOUNTER — Other Ambulatory Visit: Payer: Self-pay | Admitting: Family Medicine

## 2014-02-28 DIAGNOSIS — E042 Nontoxic multinodular goiter: Secondary | ICD-10-CM

## 2014-02-28 NOTE — Telephone Encounter (Signed)
I assume she means endocrinology, not ENT. I have never referred her to ENT.  I'll enter new referral to [facility] (Dr. Elvera Newton) for her thyroid nodules and we'll see if this can be arranged sooner than the [facility] appt.

## 2014-03-11 ENCOUNTER — Telehealth: Payer: Self-pay | Admitting: Family Medicine

## 2014-03-11 ENCOUNTER — Other Ambulatory Visit: Payer: Self-pay | Admitting: Family Medicine

## 2014-03-11 DIAGNOSIS — N390 Urinary tract infection, site not specified: Secondary | ICD-10-CM

## 2014-03-11 NOTE — Telephone Encounter (Signed)
Patient notified

## 2014-03-11 NOTE — Telephone Encounter (Signed)
[facility] - Client TELEPHONE ADVICE RECORD [facility] Patient Name: Andrea Newton DOB: 16-Nov-1955 Initial Comment Caller has had UTIs and used a script for the antibiotic, but may need to come in to doc. The caller is not sure UTI has cleared up. Nurse Assessment Nurse: Ladona RidgelGaddy, RN, Felicia Date/Time (Eastern Time): 03/11/2014 4:00:57 PM Confirm and document reason for call. If symptomatic, describe symptoms. ---PT finished antx for UTI yesterday. She has had 3 UTI's in the last year. She went to this office in Nov and he gave her an Rx and she filled it this week and started it was on the 20th and MD said she may need to see him if she uses the Rx. She had urgency - was really uncomfortable but not in pain. Then when she tried to go she couldn't. So she filled the Rx and used it. Has the patient traveled out of the country within the last 30 days? ---No Does the patient require triage? ---Yes Related visit to physician within the last 2 weeks? ---No Does the PT have any chronic conditions? (i.e. diabetes, asthma, etc.) ---No Guidelines Guideline Title Affirmed Question Affirmed Notes Final Disposition User Clinical Call Gaddy, RN, Felicia Comments she took the last pill yesterday am Pt is saying that she has a little urgency. No fever. Pt is not having any symptoms. Triaged for urinary symptoms an all answers were no. Then antx cleared it up and she is afraid of this happening again and she was so glad she had antx on hand. Caller is insistent that she wants to leave a urine sample to make sure her UTI is gone Called the office to let them know caller wanted to bring UA to make sure infeciton is gone Programme researcher, broadcasting/film/videoDarcy in the office said conference pt thru

## 2014-03-11 NOTE — Telephone Encounter (Signed)
Pt called and scheduled a Lab appt to have her urine checked because she wants to be confident her UTI is gone. She stated she has been on 3 different treatments and wants to make sure it is gone. She is currently symptom free. She has a 2:30 appt with Dr. Elvera LennoxGherghe.  Please put in orders if this is appropriate./ [facility]

## 2014-03-11 NOTE — Telephone Encounter (Signed)
OK to come in and leave urine sample and I'll send it for culture: no UA needed since she is not having sx's.

## 2014-03-12 ENCOUNTER — Telehealth: Payer: Self-pay | Admitting: Family Medicine

## 2014-03-12 ENCOUNTER — Ambulatory Visit (INDEPENDENT_AMBULATORY_CARE_PROVIDER_SITE_OTHER): Payer: BC Managed Care – PPO | Admitting: Internal Medicine

## 2014-03-12 ENCOUNTER — Other Ambulatory Visit (INDEPENDENT_AMBULATORY_CARE_PROVIDER_SITE_OTHER): Payer: BC Managed Care – PPO

## 2014-03-12 ENCOUNTER — Encounter: Payer: Self-pay | Admitting: Internal Medicine

## 2014-03-12 VITALS — BP 146/84 | HR 79 | Temp 98.1°F | Resp 12 | Ht 66.0 in | Wt 169.8 lb

## 2014-03-12 DIAGNOSIS — N390 Urinary tract infection, site not specified: Secondary | ICD-10-CM

## 2014-03-12 DIAGNOSIS — E042 Nontoxic multinodular goiter: Secondary | ICD-10-CM

## 2014-03-12 NOTE — Progress Notes (Signed)
Patient ID: Andrea Newton, female   DOB: 1956-01-29, 59 y.o.   MRN: [id number]   HPI  Andrea Lamblaine Kirtz is a 59 y.o.-year-old female, referred by her PCP, Dr. Milinda CaveMcGowen, for evaluation for MNG.  She was dx with MNG in 2010 >> saw Dr Jimmye [facility]Farmer at [facility] >> followed for them but no FNA. Last U/S at [facility] was 5 years ago.  Thyroid U/S (01/13/2014) - 4 nodules: Right thyroid lobe  Measurements: 59 x 20 x 23 mm. Nodule inhomogeneous echotexture. Largest lesion 18 x 10 x 15 mm, superior pole solid. There is an 18 x 12 x 13 mm solid lesion towards the lower pole. Background parenchyma is type hyperemic.  Left thyroid lobe  Measurements: 63 x 21 x 19 mm. Inhomogeneous nodular background parenchyma. There is a dominant 18 x 11 x 9 mm nodule, inferior pole. There is a discrete 12 x 15 x 11 mm nodule in the superior pole.  Isthmus  Thickness: 4 mm. No nodules visualized.  Lymphadenopathy  None visualized. I reviewed pt's thyroid tests: Lab Results  Component Value Date   TSH 3.41 12/31/2013   TSH 3.644 10/24/2012   FREET4 1.08 10/24/2012    Pt denies feeling nodules in neck, hoarseness, + occasional dysphagia with pills and food in last month/no odynophagia, SOB with lying down.  Pt c/o: - heat intolerance/cold intolerance - tremors - palpitations - anxiety/depression - hyperdefecation/constipation - weight loss - weight gain - dry skin - hair falling - problems with concentration - fatigue  Pt does have a FH of thyroid ds.: hypothyroidism in mother. No FH of thyroid cancer. No h/o radiation tx to head or neck.  No seaweed or kelp, no recent contrast studies.  I reviewed her chart and she also has a history of vit D def.  ROS: Constitutional: + weight gain, + fatigue, no subjective hyperthermia/hypothermia, + poor sleep Eyes: no blurry vision, no xerophthalmia ENT: no sore throat, no nodules palpated in throat, + dysphagia/no odynophagia, no  hoarseness Cardiovascular: no CP/SOB/palpitations/leg swelling Respiratory: no cough/SOB Gastrointestinal: no N/V/D/C, + mild acid reflux Musculoskeletal: no muscle/+ joint aches (knees) Skin: no rashes Neurological: no tremors/numbness/tingling/dizziness Psychiatric: no depression/anxiety  Past Medical History  Diagnosis Date  . Multiple thyroid nodules 2010 approx; 2015    Euthyroid.  Saw ENDO (Dr. Jimmye NormanFarmer).  Repeat thyroid u/s 12/2013 showed thyromegaly with multiple nodules meeting biopsy criteria--sent pt to endo.  . Infectious mononucleosis hepatitis college  . Osteoarthritis     knees>hips.  Takes no meds.  . Postmenopausal status 04/2010    FSH and LH elevated, estrogen low.  . Adenomatous colon polyp 2012   Past Surgical History  Procedure Laterality Date  . Lasik  2002  . Endometrial biopsy  2006    Performed b/c of abnormal uterine bleeding: normal (fragmented INACTIVE endometrium).  [facility] [place].  [facility]. Colonoscopy w/ polypectomy  2012    Adenomatous polyps (digestive health)-recall 2017   History   Social History  . Marital Status: Married    Spouse Name: N/A    Number of Children: 2   Occupational History  . Retired Runner, broadcasting/film/videoteacher   Social History Main Topics  . Smoking status: Never Smoker   . Smokeless tobacco: Never Used  . Alcohol Use: Yes, wine, 2x weekly  . 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   [place] [address] [place], still lives [place] farm in  [place], [place].   No tobacco, rare alcohol, no drugs.  No siblings.   No exercise but "active".   Current Outpatient Prescriptions on File Prior to Visit  Medication Sig Dispense Refill  . cholecalciferol (VITAMIN D) 1000 UNITS tablet Take 1,000 Units by mouth daily.      . phentermine 37.5 MG capsule Take 37.5 mg by mouth every morning.      . vitamin B-12 (CYANOCOBALAMIN) 1000 MCG tablet Take  1,000 mcg by mouth daily.      . ciprofloxacin (CIPRO) 500 MG tablet Take 1 tablet (500 mg total) by mouth 2 (two) times daily. (Patient not taking: Reported on 03/12/2014) 10 tablet 0  . Multiple Vitamin (MULTIVITAMIN) tablet Take 1 tablet by mouth daily.      [facility] Kitchen zolpidem (AMBIEN) 5 MG tablet Take 1 tablet (5 mg total) by mouth at bedtime as needed for sleep. (Patient not taking: Reported on 03/12/2014) 30 tablet 5   No current facility-administered medications on file prior to visit.   Allergies  Allergen Reactions  . Penicillins Rash   Family History  Problem Relation Age of Onset  . Arthritis Mother     Osteo (bilat hip replacements)  . Cancer Maternal Grandmother     ovarian  . Heart disease Maternal Grandmother   . Stroke Maternal Grandmother   Father just dx with pancreatic cancer.  PE: BP 146/84 mmHg  Pulse 79  Temp(Src) 98.1 F (36.7 C) (Oral)  Resp 12  Ht  (1.676 m)  Wt 169 lb 12.8 oz (77.021 kg)  BMI 27.42 kg/m2  SpO2 97%  LMP 02/13/2010 Wt Readings from Last 3 Encounters:  03/12/14 169 lb 12.8 oz (77.021 kg)  01/02/14 168 lb (76.204 kg)  05/21/13 174 lb (78.926 kg)   Constitutional: overweight, in NAD Eyes: PERRLA, EOMI, no exophthalmos ENT: moist mucous membranes, + thyromegaly - lumpy-bumpy, no cervical lymphadenopathy Cardiovascular: RRR, No MRG Respiratory: CTA B Gastrointestinal: abdomen soft, NT, ND, BS+ Musculoskeletal: no deformities, strength intact in all 4;  Skin: moist, warm, no rashes Neurological: no tremor with outstretched hands, DTR normal in all 4  ASSESSMENT: 1. MNG - 4 nodules: R - 2x 1.8 cm; L - 1x 1.8 cm; 1x 1.5 cm  PLAN: 1. MNG  - I reviewed the images of her thyroid ultrasound along with the patient. I pointed out that the dominant nodules are not very large, they are isoechoic or slightly hypoechoic, without calcifications and well delimited from surrounding tissue. Pt does not have a thyroid cancer family history or a  personal history of RxTx to head/neck. The only concerning factor in the increased vascularity of the entire gland, including the nodules. - the only way that we can tell exactly if it is cancer or not is by doing a thyroid biopsy (FNA). I explained what the test entails. - We discussed about other options, to wait for another 6 months to a year and see if the nodule grows, and only intervene at that time >> she would like to do this. I suggested that we Bx all of them (4 nodules) at that time. - if is not cancer, we can continue to follow her on a yearly basis, and check another ultrasound in another year or 2. - we will also check TFTs and thyroid antibody levels as Hashimoto's thyroiditis or mild Graves ds can have the sam appearance on U/S. - I'll see her back in 7 months, but she will call me to order the 4 Bx's  in 6 mo.  Component     Latest Ref Rng 03/12/2014  TSH     0.35 - 4.50 uIU/mL 2.47  Free T4     0.60 - 1.60 ng/dL 1.61  T3, Free     2.3 - 4.2 pg/mL 3.0  Thyroid Peroxidase Antibody     <9 IU/mL 61 (H)  TSI     <140 % baseline 25  Thyroid function tests normal. TPO elevated >> the increased blood flow in thyroid most likely from Hashimoto thyroiditis. This is more reassuring that if she did not have elevated TPO antibodies.

## 2014-03-12 NOTE — Patient Instructions (Signed)
Please return in August. In July, please give me a call to order the 4 thyroid biopsies. Please stop at the lab.  Thyroid Biopsy The thyroid gland is a butterfly-shaped gland situated in the front of the neck. It produces hormones which affect metabolism, growth and development, and body temperature. A thyroid biopsy is a procedure in which small samples of tissue or fluid are removed from the thyroid gland or mass and examined under a microscope. This test is done to determine the cause of thyroid problems, such as infection, cancer, or other thyroid problems. There are 2 ways to obtain samples: 1. Fine needle biopsy. Samples are removed using a thin needle inserted through the skin and into the thyroid gland or mass. 2. Open biopsy. Samples are removed after a cut (incision) is made through the skin. LET YOUR CAREGIVER KNOW ABOUT:   Allergies.  Medications taken including herbs, eye drops, over-the-counter medications, and creams.  Use of steroids (by mouth or creams).  Previous problems with anesthetics or numbing medicine.  Possibility of pregnancy, if this applies.  History of blood clots (thrombophlebitis).  History of bleeding or blood problems.  Previous surgery.  Other health problems. RISKS AND COMPLICATIONS  Bleeding from the site. The risk of bleeding is higher if you have a bleeding disorder or are taking any blood thinning medications (anticoagulants).  Infection.  Injury to structures near the thyroid gland. BEFORE THE PROCEDURE  This is a procedure that can be done as an outpatient. Confirm the time that you need to arrive for your procedure. Confirm whether there is a need to fast or withhold any medications. A blood sample may be done to determine your blood clotting time. Medicine may be given to help you relax (sedative). PROCEDURE Fine needle biopsy. You will be awake during the procedure. You may be asked to lie on your back with your head tipped backward  to extend your neck. Let your caregiver know if you cannot tolerate the positioning. An area on your neck will be cleansed. A needle is inserted through the skin of your neck. You may feel a mild discomfort during this procedure. You may be asked to avoid coughing, talking, swallowing, or making sounds during some portions of the procedure. The needle is withdrawn once tissue or fluid samples have been removed. Pressure may be applied to the neck to reduce swelling and ensure that bleeding has stopped. The samples will be sent for examination.  Open biopsy. You will be given general anesthesia. You will be asleep during the procedure. An incision is made in your neck. A sample of thyroid tissue or the mass is removed. The tissue sample or mass will be sent for examination. The sample or mass may be examined during the biopsy. If the sample or mass contains cancer cells, some or all of the thyroid gland may be removed. The incision is closed with stitches. AFTER THE PROCEDURE  Your recovery will be assessed and monitored. If there are no problems, as an outpatient, you should be able to go home shortly after the procedure. If you had a fine needle biopsy:  You may have soreness at the biopsy site for 1 to 2 days. If you had an open biopsy:   You may have soreness at the biopsy site for 3 to 4 days.  You may have a hoarse voice or sore throat for 1 to 2 days. Obtaining the Test Results It is your responsibility to obtain your test results. Do not assume  everything is normal if you have not heard from your caregiver or the medical facility. It is important for you to follow up on all of your test results. HOME CARE INSTRUCTIONS   Keeping your head raised on a pillow when you are lying down may ease biopsy site discomfort.  Supporting the back of your head and neck with both hands as you sit up from a lying position may ease biopsy site discomfort.  Only take over-the-counter or prescription  medicines for pain, discomfort, or fever as directed by your caregiver.  Throat lozenges or gargling with warm salt water may help to soothe a sore throat. SEEK IMMEDIATE MEDICAL CARE IF:   You have severe bleeding from the biopsy site.  You have difficulty swallowing.  You have a fever.  You have increased pain, swelling, redness, or warmth at the biopsy site.  You notice pus coming from the biopsy site.  You have swollen glands (lymph nodes) in your neck. Document Released: 11/27/2006 Document Revised: 05/27/2012 Document Reviewed: 04/24/2013 [facility] Patient Information 2015 [employer] [place]. This information is not intended to replace advice given to you by your health care provider. Make sure you discuss any questions you have with your health care provider.

## 2014-03-12 NOTE — Telephone Encounter (Signed)
Pt came in today to give Urine for culture.  She would like to know if she could have an RX on hand for UTI since she says they seem to be recurrent.  Please advise.

## 2014-03-13 ENCOUNTER — Encounter: Payer: Self-pay | Admitting: Family Medicine

## 2014-03-13 ENCOUNTER — Other Ambulatory Visit: Payer: Self-pay | Admitting: Family Medicine

## 2014-03-13 LAB — URINE CULTURE
Colony Count: NO GROWTH
ORGANISM ID, BACTERIA: NO GROWTH

## 2014-03-13 LAB — THYROID PEROXIDASE ANTIBODY: Thyroperoxidase Ab SerPl-aCnc: 61 IU/mL — ABNORMAL HIGH (ref <9)

## 2014-03-13 MED ORDER — CIPROFLOXACIN HCL 500 MG PO TABS: 500.0000 mg | ORAL_TABLET | Freq: Two times a day (BID) | ORAL | Status: DC

## 2014-03-13 NOTE — Telephone Encounter (Signed)
LMOM for pt to CB.  

## 2014-03-13 NOTE — Telephone Encounter (Signed)
I eRx'd cipro to her pharmacy (with 1 additional RF).  Pls have her call and notify us when she has to take these rx's. Also tell her that she is also welcome to make appt for o/v for these illnesses if she feel like it is necessary (severe sx's, fever, n/v, vague symptoms).  If she goes through these next antibiotic rx's pretty quick due to recurrent UTI's then I encourage her to come in to discuss options for using antibiotic for PREVENTION of UTIs OR getting urologist referral.--thx

## 2014-03-16 LAB — TSH: TSH: 2.47 u[IU]/mL (ref 0.35–4.50)

## 2014-03-16 LAB — THYROID STIMULATING IMMUNOGLOBULIN: TSI: 25 %{baseline} (ref <140)

## 2014-03-16 LAB — T3, FREE: T3, Free: 3 pg/mL (ref 2.3–4.2)

## 2014-03-16 LAB — T4, FREE: FREE T4: 0.86 ng/dL (ref 0.60–1.60)

## 2014-03-16 NOTE — Telephone Encounter (Signed)
Left detailed message on pt's cell.  Okay per DPR. 

## 2014-03-18 ENCOUNTER — Encounter: Payer: Self-pay | Admitting: Family Medicine

## 2014-03-31 ENCOUNTER — Encounter: Payer: Self-pay | Admitting: Family Medicine

## 2014-03-31 ENCOUNTER — Ambulatory Visit (INDEPENDENT_AMBULATORY_CARE_PROVIDER_SITE_OTHER): Payer: BC Managed Care – PPO | Admitting: Family Medicine

## 2014-03-31 VITALS — BP 148/83 | HR 68 | Temp 98.7°F | Ht 67.5 in | Wt 171.0 lb

## 2014-03-31 DIAGNOSIS — R3915 Urgency of urination: Secondary | ICD-10-CM

## 2014-03-31 DIAGNOSIS — K625 Hemorrhage of anus and rectum: Secondary | ICD-10-CM

## 2014-03-31 LAB — CBC WITH DIFFERENTIAL/PLATELET
BASOS PCT: 1.5 % (ref 0.0–3.0)
Basophils Absolute: 0.1 10*3/uL (ref 0.0–0.1)
Eosinophils Absolute: 0 10*3/uL (ref 0.0–0.7)
Eosinophils Relative: 0 % (ref 0.0–5.0)
HCT: 39.2 % (ref 36.0–46.0)
Hemoglobin: 13.2 g/dL (ref 12.0–15.0)
LYMPHS PCT: 34.3 % (ref 12.0–46.0)
Lymphs Abs: 1.7 10*3/uL (ref 0.7–4.0)
MCHC: 33.6 g/dL (ref 30.0–36.0)
MCV: 90.3 fl (ref 78.0–100.0)
MONO ABS: 0.3 10*3/uL (ref 0.1–1.0)
Monocytes Relative: 5.1 % (ref 3.0–12.0)
NEUTROS ABS: 3 10*3/uL (ref 1.4–7.7)
NEUTROS PCT: 59.1 % (ref 43.0–77.0)
Platelets: 251 10*3/uL (ref 150.0–400.0)
RBC: 4.35 Mil/uL (ref 3.87–5.11)
RDW: 13.3 % (ref 11.5–15.5)
WBC: 5 10*3/uL (ref 4.0–10.5)

## 2014-03-31 LAB — POCT URINALYSIS DIPSTICK
Bilirubin, UA: NEGATIVE
Glucose, UA: NEGATIVE
Ketones, UA: NEGATIVE
LEUKOCYTES UA: NEGATIVE
Nitrite, UA: NEGATIVE
PH UA: 7
PROTEIN UA: NEGATIVE
Spec Grav, UA: 1.02
UROBILINOGEN UA: 0.2

## 2014-03-31 NOTE — Progress Notes (Signed)
OFFICE NOTE  03/31/2014  CC:  Chief Complaint  Patient presents with  . Blood In Stools   HPI: Patient is a 59 y.o. Caucasian female who is here for seeing blood in stool on two days in the last 4 days.  Describes formed bm with BRB that came in stool and was noted in the toilet water.  No pain in rectal area.  Some blood on toilet tissue but this was not persistent as may be seen with external hemorrhoids.  No lower abd pain.  A bit of urinary urgency lately, without dysyria.  "Like I need to go but sometimes I get there and I don't.  Some mild pain in low back yesterday. She is anxious lately, esp since her dad was recently dx'd with pancreatic cancer.  Pertinent PMH:  Past Medical History  Diagnosis Date  . Multiple thyroid nodules 2010 approx; 2015    Euthyroid.  Saw ENDO (Dr. Jimmye NormanFarmer).  Repeat thyroid u/s 12/2013 showed thyromegaly with multiple nodules meeting biopsy criteria--sent pt to Dr. Elvera LennoxGherghe and plan for repeat u/s in 6 mo was made, possible bx at that time.  Labs 03/2014 showed elevted TPO ab, supportive of Hashimoto's thyroiditis--a reassuring finding.  Pt euthyroid.  . Infectious mononucleosis hepatitis college  . Osteoarthritis     knees>hips.  Takes no meds.  . Postmenopausal status 04/2010    FSH and LH elevated, estrogen low.  . Adenomatous colon polyp 2012  . Recurrent UTI    Past Surgical History  Procedure Laterality Date  . Lasik  2002  . Endometrial biopsy  2006    Performed b/c of abnormal uterine bleeding: normal (fragmented INACTIVE endometrium).  [facility], [place].  Marland Kitchen. Colonoscopy w/ polypectomy  2012    Adenomatous polyps ([facility])-recall 2017    MEDS: Not taking cipro or phentermine listed below Outpatient Prescriptions Prior to Visit  Medication Sig Dispense Refill  . calcium & magnesium carbonates (MYLANTA) 311-232 MG per tablet Take 1 tablet by mouth daily.    . cholecalciferol (VITAMIN D) 1000 UNITS tablet Take 1,000  Units by mouth daily.      . Multiple Vitamin (MULTIVITAMIN) tablet Take 1 tablet by mouth daily.      . Omeprazole 20 MG TBEC Take 1 tablet by mouth every other day.    . Probiotic Product (PROBIOTIC DAILY PO) Take 1 capsule by mouth daily.    Marland Kitchen. selenium sulfide (SELSUN) 2.5 % shampoo   5  . vitamin B-12 (CYANOCOBALAMIN) 1000 MCG tablet Take 1,000 mcg by mouth daily.      Marland Kitchen. zolpidem (AMBIEN) 5 MG tablet Take 1 tablet (5 mg total) by mouth at bedtime as needed for sleep. 30 tablet 5  . ciprofloxacin (CIPRO) 500 MG tablet Take 1 tablet (500 mg total) by mouth 2 (two) times daily. (Patient not taking: Reported on 03/31/2014) 10 tablet 1  . phentermine 37.5 MG capsule Take 37.5 mg by mouth every morning.       No facility-administered medications prior to visit.    PE: Blood pressure 148/83, pulse 68, temperature 98.7 F (37.1 C), temperature source Temporal, height 5' 7.5" (1.715 m), weight 171 lb (77.565 kg), last menstrual period 02/13/2010, SpO2 100 %.  Pt examined with Caren MacadamMichelle Miller, CMA, as chaperone. Gen: Alert, well appearing.  Patient is oriented to person, place, time, and situation. Skin - no sores or suspicious lesions or rashes or color changes ZOX:WRUEENT:Eyes: no injection, icteris, swelling, or exudate.  EOMI, PERRLA. Mouth: lips without  lesion/swelling.  Oral mucosa pink and moist. Oropharynx without erythema, exudate, or swelling.  CV: RRR, no m/r/g.   LUNGS: CTA bilat, nonlabored resps, good aeration in all lung fields. ABD: soft, NT, ND, BS normal.  No hepatospenomegaly or mass.  No bruits. EXT: no clubbing, cyanosis, or edema.  Rectal: many old/noninflamed ext hemorrhoid tags, no tenderness, no fissure. Normal rectal tone, no mass or stool palpable in rectal vault. Stool wipings are hemoccult negative today.    Chemistry      Component Value Date/Time   NA 142 12/31/2013 0838   K 4.2 12/31/2013 0838   CL 104 12/31/2013 0838   CO2 31 12/31/2013 0838   BUN 21 12/31/2013  0838   CREATININE 0.6 12/31/2013 0838   CREATININE 0.60 10/24/2012 1613      Component Value Date/Time   CALCIUM 9.1 12/31/2013 0838   ALKPHOS 74 12/31/2013 0838   AST 18 12/31/2013 0838   ALT 19 12/31/2013 0838   BILITOT 0.7 12/31/2013 0838      IMPRESSION AND PLAN:  1) Painless rectal bleeding: first time this has happened to pt and she is very anxious. CBC today. Advised pt to f/[facility] for [facility] of [facility]oscopy.  2) Recurrent UTI's: question of symptom currently but also question of "nerves". Trace intact blood on UA today, will send for c/s and give no abx at this time.  An After Visit Summary was printed and given to the patient.   FOLLOW UP: prn

## 2014-03-31 NOTE — Patient Instructions (Signed)
Call you GI office ([facility]) and make appt to be seen for painless rectal bleeding.

## 2014-03-31 NOTE — Progress Notes (Signed)
Pre visit review using our clinic review tool, if applicable. No additional management support is needed unless otherwise documented below in the visit note. 

## 2014-04-02 LAB — URINE CULTURE
Colony Count: NO GROWTH
Organism ID, Bacteria: NO GROWTH

## 2014-04-14 DIAGNOSIS — Z86010 Personal history of colonic polyps: Secondary | ICD-10-CM

## 2014-04-14 DIAGNOSIS — Z860101 Personal history of adenomatous and serrated colon polyps: Secondary | ICD-10-CM

## 2014-04-14 HISTORY — DX: Personal history of colonic polyps: Z86.010

## 2014-04-14 HISTORY — DX: Personal history of adenomatous and serrated colon polyps: Z86.0101

## 2014-05-09 ENCOUNTER — Encounter: Payer: Self-pay | Admitting: Family Medicine

## 2014-05-19 ENCOUNTER — Encounter: Payer: Self-pay | Admitting: Family Medicine

## 2014-05-21 ENCOUNTER — Encounter: Payer: Self-pay | Admitting: Family Medicine

## 2014-06-01 ENCOUNTER — Encounter: Payer: Self-pay | Admitting: Family Medicine

## 2014-06-08 ENCOUNTER — Encounter: Payer: Self-pay | Admitting: Family Medicine

## 2014-06-09 ENCOUNTER — Encounter: Payer: Self-pay | Admitting: Family Medicine

## 2014-06-29 ENCOUNTER — Telehealth: Payer: Self-pay | Admitting: Family Medicine

## 2014-07-02 NOTE — Telephone Encounter (Signed)
Opened in error

## 2014-07-08 ENCOUNTER — Encounter: Payer: Self-pay | Admitting: Family Medicine

## 2014-09-17 ENCOUNTER — Encounter: Payer: Self-pay | Admitting: *Deleted

## 2014-09-21 ENCOUNTER — Other Ambulatory Visit: Payer: Self-pay | Admitting: *Deleted

## 2014-09-21 DIAGNOSIS — G4700 Insomnia, unspecified: Secondary | ICD-10-CM

## 2014-09-21 MED ORDER — ZOLPIDEM TARTRATE 5 MG PO TABS: 5.0000 mg | ORAL_TABLET | Freq: Every evening | ORAL | Status: DC | PRN

## 2014-09-21 NOTE — Telephone Encounter (Signed)
Rx faxed

## 2014-09-21 NOTE — Telephone Encounter (Signed)
RF request for zolpidem LOV: 03/31/14 acute visit, 01/02/14 CPE, over due for f/u Next ov: None Last written: 01/02/14 #30 w/ 5RF Please advise. Thanks.

## 2014-09-29 ENCOUNTER — Other Ambulatory Visit: Payer: Self-pay | Admitting: Internal Medicine

## 2014-09-29 ENCOUNTER — Encounter: Payer: Self-pay | Admitting: Internal Medicine

## 2014-09-29 DIAGNOSIS — E042 Nontoxic multinodular goiter: Secondary | ICD-10-CM

## 2014-10-06 LAB — HM MAMMOGRAPHY

## 2014-10-09 ENCOUNTER — Other Ambulatory Visit: Payer: Self-pay | Admitting: Internal Medicine

## 2014-10-09 DIAGNOSIS — E042 Nontoxic multinodular goiter: Secondary | ICD-10-CM

## 2014-10-12 ENCOUNTER — Encounter: Payer: Self-pay | Admitting: Family Medicine

## 2014-10-13 ENCOUNTER — Ambulatory Visit
Admission: RE | Admit: 2014-10-13 | Discharge: 2014-10-13 | Disposition: A | Discharge status: Home / Self Care | Payer: BC Managed Care – PPO | Source: Ambulatory Visit | Attending: Internal Medicine | Admitting: Internal Medicine

## 2014-10-22 ENCOUNTER — Encounter: Payer: Self-pay | Admitting: Family Medicine

## 2014-10-22 ENCOUNTER — Ambulatory Visit: Payer: BC Managed Care – PPO | Admitting: Family Medicine

## 2014-10-22 ENCOUNTER — Other Ambulatory Visit: Payer: BC Managed Care – PPO

## 2014-11-05 ENCOUNTER — Ambulatory Visit
Admission: RE | Admit: 2014-11-05 | Discharge: 2014-11-05 | Disposition: A | Discharge status: Home / Self Care | Payer: BC Managed Care – PPO | Source: Ambulatory Visit | Attending: Internal Medicine | Admitting: Internal Medicine

## 2014-11-05 ENCOUNTER — Other Ambulatory Visit: Payer: BC Managed Care – PPO

## 2014-11-05 ENCOUNTER — Other Ambulatory Visit (HOSPITAL_COMMUNITY)
Admission: RE | Admit: 2014-11-05 | Discharge: 2014-11-05 | Disposition: A | Discharge status: Home / Self Care | Payer: BC Managed Care – PPO | Source: Ambulatory Visit | Attending: Physician Assistant | Admitting: Physician Assistant

## 2014-11-05 DIAGNOSIS — E041 Nontoxic single thyroid nodule: Secondary | ICD-10-CM | POA: Insufficient documentation

## 2014-11-05 HISTORY — PX: BIOPSY THYROID: PRO38

## 2014-11-05 NOTE — Procedures (Signed)
Ultrasound guided needle aspirate biopsy performed of the dominant left thyroid nodule.  Local anesthesia was provided with 1% lidocaine.  Using direct ultrasound guidance, 4 passes were made using needles into the nodule within the right lobe of the thyroid.   Ultrasound was used to confirm needle placements on all occasions.   Specimens were sent to Pathology for analysis.   Gwynneth Macleod PA-C 11/05/2014 4:34 PM

## 2014-11-30 ENCOUNTER — Telehealth: Payer: Self-pay | Admitting: Family Medicine

## 2014-11-30 ENCOUNTER — Other Ambulatory Visit: Payer: Self-pay | Admitting: Family Medicine

## 2014-11-30 DIAGNOSIS — L309 Dermatitis, unspecified: Secondary | ICD-10-CM | POA: Insufficient documentation

## 2014-11-30 MED ORDER — SELENIUM SULFIDE 2.5 % EX LOTN: 1.0000 "application " | TOPICAL_LOTION | CUTANEOUS | Status: DC

## 2014-11-30 NOTE — Telephone Encounter (Signed)
Please call patient, received refill request for her selenium sulfide shampoo. I do not see indication as to what she is using this for, or how she is using the shampoo. Please call patient get more information on usage and indication, and I can refill appropriate.

## 2014-11-30 NOTE — Telephone Encounter (Signed)
Pt states that she uses it for eczema on her scalp.  She uses it every other day.  Please advise.

## 2014-11-30 NOTE — Telephone Encounter (Signed)
Refilled medicated shampoo x1, future refills from PCP.

## 2014-11-30 NOTE — Telephone Encounter (Signed)
Selenium Sulfide [facility] [place]

## 2014-11-30 NOTE — Telephone Encounter (Signed)
Patient aware that Dr. Milinda CaveMcGowen is out of the office this week.

## 2014-11-30 NOTE — Telephone Encounter (Signed)
Okay to refill?  Last RX printed 01/21/15 x 5 rfs.  Please advise.

## 2015-03-13 ENCOUNTER — Encounter: Payer: Self-pay | Admitting: Family Medicine

## 2015-03-24 ENCOUNTER — Other Ambulatory Visit: Payer: Self-pay | Admitting: *Deleted

## 2015-03-24 DIAGNOSIS — G4700 Insomnia, unspecified: Secondary | ICD-10-CM

## 2015-03-24 MED ORDER — ZOLPIDEM TARTRATE 5 MG PO TABS: 5.0000 mg | ORAL_TABLET | Freq: Every evening | ORAL | Status: DC | PRN

## 2015-03-24 NOTE — Telephone Encounter (Signed)
RF request for Zolpidem LOV: 01/02/14 Next ov: None Last written: 09/21/14 #30 w/ 3RF  Please advise. Thanks.

## 2015-03-24 NOTE — Telephone Encounter (Signed)
Rx faxed. Per Dr. Milinda Cave have pt schedule CPR in the next few months. Left detailed message on home vm, okay per DPR.

## 2015-03-29 ENCOUNTER — Encounter: Payer: Self-pay | Admitting: Family Medicine

## 2015-03-29 ENCOUNTER — Ambulatory Visit (INDEPENDENT_AMBULATORY_CARE_PROVIDER_SITE_OTHER): Payer: BC Managed Care – PPO | Admitting: Family Medicine

## 2015-03-29 VITALS — BP 137/86 | HR 83 | Temp 97.9°F | Resp 20 | Wt 182.0 lb

## 2015-03-29 DIAGNOSIS — J0100 Acute maxillary sinusitis, unspecified: Secondary | ICD-10-CM | POA: Diagnosis not present

## 2015-03-29 MED ORDER — DOXYCYCLINE HYCLATE 100 MG PO TABS: 100.0000 mg | ORAL_TABLET | Freq: Two times a day (BID) | ORAL | Status: DC

## 2015-03-29 NOTE — Patient Instructions (Signed)

## 2015-03-29 NOTE — Progress Notes (Signed)
Patient ID: Talecia Sherlin, female   DOB: [birth year], 60 y.o.   MRN: [id number]    Nell Gales , [birth year], 60 y.o., female MRN: [id number]  CC: sinus pressure Subjective: Pt presents for an acute OV with complaints of rhinorrhea, nasal congestion, facial pressure, sinus headache, ear pressure of 12 days duration. Thought she was getting better and then started to worsen again.  No fever, chills, myalgias, nausea, vomit or diarrhea. Husband is also sick.,  Mucinex DM,  Mucinex sinus, aleve sinus, tylenol.  Allergies  Allergen Reactions  . Penicillins Rash   Social History  Substance Use Topics  . Smoking status: Never Smoker   . Smokeless tobacco: Never Used  . Alcohol Use: Yes   Past Medical History  Diagnosis Date  . Multiple thyroid nodules 2010 approx; 2015    Euthyroid.  Saw ENDO (Dr. Jimmye Norman).  Repeat thyroid u/s 12/2013 showed thyromegaly with multiple nodules meeting biopsy criteria--sent pt to Dr. Elvera Lennox and plan for repeat u/s in 6 mo was made, possible bx at that time.  Labs 03/2014 showed elevted TPO ab, supportive of Hashimoto's thyroiditis--a reassuring finding.  Pt euthyroid.  . Infectious mononucleosis hepatitis college  . Osteoarthritis     knees>hips.  Takes no meds.  . Postmenopausal status 04/2010    FSH and LH elevated, estrogen low.  . Recurrent UTI   . Bleeding internal hemorrhoids 2016  . History of adenomatous polyp of colon 04/2014    x 1: recall 5 yrs ([facility])  . GERD (gastroesophageal reflux disease)   . Constipation    Past Surgical History  Procedure Laterality Date  . Lasik  2002  . Endometrial biopsy  2006    Performed b/c of abnormal uterine bleeding: normal (fragmented INACTIVE endometrium).  [facility], [place].  Marland Kitchen Colonoscopy w/ polypectomy  05/2011; 04/2014    IH and 'tics (Dr. Rhetta Mura w/ [facility]).  Polyp 04/2014= tubular adenoma (recall 5 yrs)    . Hemorrhoid banding  April and May 2016      [facility]   Family History  Problem Relation Age of Onset  . Arthritis Mother     Osteo (bilat hip replacements)  . Cancer Maternal Grandmother     ovarian  . Heart disease Maternal Grandmother   . Stroke Maternal Grandmother      Medication List       This list is accurate as of: 03/29/15  1:45 PM.  Always use your most recent med list.               calcium & magnesium carbonates 311-232 MG tablet  Commonly known as:  MYLANTA  Take 1 tablet by mouth daily.     cetirizine 10 MG tablet  Commonly known as:  ZYRTEC  Take 10 mg by mouth daily.     cholecalciferol 1000 units tablet  Commonly known as:  VITAMIN D  Take 1,000 Units by mouth daily.     fluticasone 50 MCG/ACT nasal spray  Commonly known as:  FLONASE     multivitamin tablet  Take 1 tablet by mouth daily.     Omeprazole 20 MG Tbec  Take 1 tablet by mouth every other day.     PROBIOTIC DAILY PO  Take 1 capsule by mouth daily.     selenium sulfide 2.5 % shampoo  Commonly known as:  SELSUN  Apply 1 application topically every other day.     vitamin B-12 1000 MCG tablet  Commonly known  as:  CYANOCOBALAMIN  Take 1,000 mcg by mouth daily.     zolpidem 5 MG tablet  Commonly known as:  AMBIEN  Take 1 tablet (5 mg total) by mouth at bedtime as needed for sleep.       ROS: Negative, with the exception of above mentioned in HPI  Objective:  BP 137/86 mmHg  Pulse 83  Temp(Src) 97.9 F (36.6 C)  Resp 20  Wt 182 lb (82.555 kg)  SpO2 96%  LMP 02/13/2010 Body mass index is 28.07 kg/(m^2). Gen: Afebrile. No acute distress. Nontoxic in appearance. Well developed, well nourished.  HENT: AT. [place]. Bilateral TM visualized and normal in appearance. MMM, no oral lesions. Bilateral nares with erythema, no swelling. Throat without erythema or exudates. No cough. TTP Max sinus.  Eyes:Pupils Equal Round Reactive to light, Extraocular movements intact,  Conjunctiva without redness, discharge or  icterus. Neck/lymp/endocrine: Supple, shotty cervical lymphadenopathy,  CV: RRR  Chest: CTAB, no wheeze or crackles. Good air movement, normal resp effort.  Abd: Soft.  NTND. B present  Skin: No rashes, purpura or petechiae.  Neuro: Normal gait. PERLA. EOMi. Alert. Oriented x3  Assessment/Plan: Adryanna Friedt is a 60 y.o. female present for acute OV for  1. Acute non-recurrent maxillary sinusitis - Continue flonase, plain Mucinex, rest and hydrate - doxycycline (VIBRA-TABS) 100 MG tablet; Take 1 tablet (100 mg total) by mouth 2 (two) times daily.  Dispense: 20 tablet; Refill: 0 - f/u as needed.  Aleyza Salmi Claiborne Billings, DO  [place]- OR

## 2015-05-14 ENCOUNTER — Encounter: Payer: Self-pay | Admitting: Family Medicine

## 2015-05-14 ENCOUNTER — Ambulatory Visit (INDEPENDENT_AMBULATORY_CARE_PROVIDER_SITE_OTHER): Payer: BC Managed Care – PPO | Admitting: Family Medicine

## 2015-05-14 VITALS — BP 126/71 | HR 73 | Temp 98.1°F | Resp 16 | Ht 65.5 in | Wt 180.0 lb

## 2015-05-14 DIAGNOSIS — F329 Major depressive disorder, single episode, unspecified: Secondary | ICD-10-CM

## 2015-05-14 DIAGNOSIS — Z0000 Encounter for general adult medical examination without abnormal findings: Secondary | ICD-10-CM

## 2015-05-14 DIAGNOSIS — Z780 Asymptomatic menopausal state: Secondary | ICD-10-CM

## 2015-05-14 DIAGNOSIS — F32A Depression, unspecified: Secondary | ICD-10-CM

## 2015-05-14 DIAGNOSIS — E559 Vitamin D deficiency, unspecified: Secondary | ICD-10-CM

## 2015-05-14 DIAGNOSIS — Z1159 Encounter for screening for other viral diseases: Secondary | ICD-10-CM

## 2015-05-14 LAB — COMPREHENSIVE METABOLIC PANEL
ALBUMIN: 4.2 g/dL (ref 3.5–5.2)
ALK PHOS: 81 U/L (ref 39–117)
ALT: 16 U/L (ref 0–35)
AST: 14 U/L (ref 0–37)
BUN: 18 mg/dL (ref 6–23)
CALCIUM: 9.3 mg/dL (ref 8.4–10.5)
CHLORIDE: 105 meq/L (ref 96–112)
CO2: 30 mEq/L (ref 19–32)
CREATININE: 0.66 mg/dL (ref 0.40–1.20)
GFR: 97.9 mL/min (ref >60.00)
Glucose, Bld: 96 mg/dL (ref 70–99)
POTASSIUM: 4.1 meq/L (ref 3.5–5.1)
Sodium: 141 mEq/L (ref 135–145)
TOTAL PROTEIN: 6.9 g/dL (ref 6.0–8.3)
Total Bilirubin: 0.4 mg/dL (ref 0.2–1.2)

## 2015-05-14 LAB — VITAMIN D 25 HYDROXY (VIT D DEFICIENCY, FRACTURES): VITD: 42.31 ng/mL (ref 30.00–100.00)

## 2015-05-14 LAB — LIPID PANEL
CHOLESTEROL: 214 mg/dL — AB (ref 0–200)
HDL: 52 mg/dL (ref >39.00)
LDL CALC: 148 mg/dL — AB (ref 0–99)
NonHDL: 161.85
TRIGLYCERIDES: 68 mg/dL (ref 0.0–149.0)
Total CHOL/HDL Ratio: 4
VLDL: 13.6 mg/dL (ref 0.0–40.0)

## 2015-05-14 LAB — CBC WITH DIFFERENTIAL/PLATELET
BASOS PCT: 0.9 % (ref 0.0–3.0)
Basophils Absolute: 0 10*3/uL (ref 0.0–0.1)
EOS PCT: 0 % (ref 0.0–5.0)
Eosinophils Absolute: 0 10*3/uL (ref 0.0–0.7)
HEMATOCRIT: 37.8 % (ref 36.0–46.0)
HEMOGLOBIN: 12.6 g/dL (ref 12.0–15.0)
LYMPHS PCT: 30.4 % (ref 12.0–46.0)
Lymphs Abs: 1.3 10*3/uL (ref 0.7–4.0)
MCHC: 33.3 g/dL (ref 30.0–36.0)
MCV: 89.7 fl (ref 78.0–100.0)
MONOS PCT: 5.4 % (ref 3.0–12.0)
Monocytes Absolute: 0.2 10*3/uL (ref 0.1–1.0)
NEUTROS ABS: 2.8 10*3/uL (ref 1.4–7.7)
Neutrophils Relative %: 63.3 % (ref 43.0–77.0)
PLATELETS: 253 10*3/uL (ref 150.0–400.0)
RBC: 4.21 Mil/uL (ref 3.87–5.11)
RDW: 13.9 % (ref 11.5–15.5)
WBC: 4.4 10*3/uL (ref 4.0–10.5)

## 2015-05-14 LAB — TSH: TSH: 3.16 u[IU]/mL (ref 0.35–4.50)

## 2015-05-14 MED ORDER — DULOXETINE HCL 30 MG PO CPEP: 30.0000 mg | ORAL_CAPSULE | Freq: Every day | ORAL | Status: DC

## 2015-05-14 NOTE — Progress Notes (Signed)
Office Note 05/14/2015  CC:  Chief Complaint  Patient presents with  . Annual Exam    Pt is not fasting.     HPI:  Andrea Newton is a 60 y.o. White female who is here for CPE. Last Pap was 09/2013: she has a GYN and we need to get the records.  Having painless rectal bleeding since having some hemorrhoid banding recently.  Has made a f/u appt with her GI MD.  Both parents died in the last year and she has done grieving/going through some depression.  She is fighting through it pretty well but has considered antidepressant.  Also lost an aunt and a best friend in last 6 mo. Describes some persistent depressed mood, anhedonia, low energy level, poor concentration.  NO SI or HI.   Past Medical History  Diagnosis Date  . Multiple thyroid nodules 2010 approx; 2015    Euthyroid.  Saw ENDO (Dr. Jimmye Norman).  Repeat thyroid u/s 12/2013 showed thyromegaly with multiple nodules meeting biopsy criteria--sent pt to Dr. Elvera Lennox and plan for repeat u/s in 6 mo was made, possible bx at that time.  Labs 03/2014 showed elevted TPO ab, supportive of Hashimoto's thyroiditis--a reassuring finding.  Pt euthyroid.  . Infectious mononucleosis hepatitis college  . Osteoarthritis     knees>hips.  Takes no meds.  . Postmenopausal status 04/2010    FSH and LH elevated, estrogen low.  . Recurrent UTI   . Bleeding internal hemorrhoids 2016  . History of adenomatous polyp of colon 04/2014    x 1: recall 5 yrs ([facility])  . GERD (gastroesophageal reflux disease)   . Constipation     Past Surgical History  Procedure Laterality Date  . Lasik  2002  . Endometrial biopsy  2006    Performed b/c of abnormal uterine bleeding: normal (fragmented INACTIVE endometrium).  [facility], [place].  Marland Kitchen Colonoscopy w/ polypectomy  05/2011; 04/2014    IH and 'tics (Dr[facility] Rhetta Mura w/ [facility]).  Polyp 04/2014= tubular adenoma (recall 5 yrs)    . Hemorrhoid banding  April and May 2016   [facility]  . Biopsy thyroid  11/05/14    Scant follicular epithelium: benign    Family History  Problem Relation Age of Onset  . Arthritis Mother     Osteo (bilat hip replacements)  . Cancer Maternal Grandmother     ovarian  . Heart disease Maternal Grandmother   . Stroke Maternal Grandmother     Social History   Social History  . Marital Status: Married    Spouse Name: N/A  . Number of Children: N/A  . Years of Education: N/A   Occupational History  . Not on file.   Social History Main Topics  . Smoking status: Never Smoker   . Smokeless tobacco: Never Used  . Alcohol Use: Yes  . Drug Use: No  . Sexual Activity: Not on file   Other Topics Concern  . Not on file   Social History Narrative   Married, 2 children (son Hertenstein in his 41s, daughter age 23 at [employer]).   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   Native [address], still lives on her family's farm in [place], [name].   No tobacco, rare alcohol, no drugs.  No siblings.   No exercise but "active".    Outpatient Prescriptions Prior to Visit  Medication Sig Dispense Refill  . calcium & magnesium carbonates (MYLANTA) 161-096 MG per tablet Take 1 tablet  by mouth daily.    . cetirizine (ZYRTEC) 10 MG tablet Take 10 mg by mouth daily.    . cholecalciferol (VITAMIN D) 1000 UNITS tablet Take 1,000 Units by mouth daily.      . fluticasone (FLONASE) 50 MCG/ACT nasal spray     . Omeprazole 20 MG TBEC Take 1 tablet by mouth every other day.    . Probiotic Product (PROBIOTIC DAILY PO) Take 1 capsule by mouth daily.    Marland Kitchen. selenium sulfide (SELSUN) 2.5 % shampoo Apply 1 application topically every other day. 118 mL 0  . zolpidem (AMBIEN) 5 MG tablet Take 1 tablet (5 mg total) by mouth at bedtime as needed for sleep. 30 tablet 5  . doxycycline (VIBRA-TABS) 100 MG tablet Take 1 tablet (100 mg total) by mouth 2 (two) times daily. (Patient not taking: Reported on 05/14/2015) 20 tablet 0  .  Multiple Vitamin (MULTIVITAMIN) tablet Take 1 tablet by mouth daily. Reported on 05/14/2015    . vitamin B-12 (CYANOCOBALAMIN) 1000 MCG tablet Take 1,000 mcg by mouth daily. Reported on 05/14/2015     No facility-administered medications prior to visit.    Allergies  Allergen Reactions  . Penicillins Rash    ROS Review of Systems  Constitutional: Positive for fatigue. Negative for fever, chills and appetite change.  HENT: Negative for congestion, dental problem, ear pain and sore throat.   Eyes: Negative for discharge, redness and visual disturbance.  Respiratory: Negative for cough, chest tightness, shortness of breath and wheezing.   Cardiovascular: Negative for chest pain, palpitations and leg swelling.  Gastrointestinal: Positive for anal bleeding (see HPI). Negative for nausea, vomiting, abdominal pain, diarrhea and blood in stool.       Nondescript "upset stomach" feeling much of the time the last few weeks.  Genitourinary: Negative for dysuria, urgency, frequency, hematuria, flank pain and difficulty urinating.  Musculoskeletal: Negative for myalgias, back pain, joint swelling, arthralgias and neck stiffness.  Skin: Negative for pallor and rash.  Neurological: Negative for dizziness, speech difficulty, weakness and headaches.  Hematological: Negative for adenopathy. Does not bruise/bleed easily.  Psychiatric/Behavioral: Positive for sleep disturbance and dysphoric mood. Negative for confusion. The patient is not nervous/anxious.     PE; Blood pressure 126/71, pulse 73, temperature 98.1 F (36.7 C), temperature source Oral, resp. rate 16, height 5' 5.5" (1.664 m), weight 180 lb (81.647 kg), last menstrual period 02/13/2010, SpO2 95 %. Gen: Alert, well appearing.  Patient is oriented to person, place, time, and situation. AFFECT: pleasant, lucid thought and speech. ENT: Ears: EACs clear, normal epithelium.  TMs with good light reflex and landmarks bilaterally.  Eyes: no injection,  icteris, swelling, or exudate.  EOMI, PERRLA. Nose: no drainage or turbinate edema/swelling.  No injection or focal lesion.  Mouth: lips without lesion/swelling.  Oral mucosa pink and moist.  Dentition intact and without obvious caries or gingival swelling.  Oropharynx without erythema, exudate, or swelling.  Neck: supple/nontender.  No LAD, mass, or TM.  Carotid pulses 2+ bilaterally, without bruits. CV: RRR, no m/r/g.   LUNGS: CTA bilat, nonlabored resps, good aeration in all lung fields. ABD: soft, NT, ND, BS normal.  No hepatospenomegaly or mass.  No bruits. EXT: no clubbing, cyanosis, or edema.  Musculoskeletal: no joint swelling, erythema, warmth, or tenderness.  ROM of all joints intact. Skin - no sores or suspicious lesions or rashes or color changes  Pertinent labs:  Lab Results  Component Value Date   TSH 2.47 03/12/2014   Lab Results  Component Value Date   WBC 5.0 03/31/2014   HGB 13.2 03/31/2014   HCT 39.2 03/31/2014   MCV 90.3 03/31/2014   PLT 251.0 03/31/2014   Lab Results  Component Value Date   CREATININE 0.6 12/31/2013   BUN 21 12/31/2013   NA 142 12/31/2013   K 4.2 12/31/2013   CL 104 12/31/2013   CO2 31 12/31/2013   Lab Results  Component Value Date   ALT 19 12/31/2013   AST 18 12/31/2013   ALKPHOS 74 12/31/2013   BILITOT 0.7 12/31/2013   Lab Results  Component Value Date   CHOL 210* 12/31/2013   Lab Results  Component Value Date   HDL 53.40 12/31/2013   Lab Results  Component Value Date   LDLCALC 142* 12/31/2013   Lab Results  Component Value Date   TRIG 73.0 12/31/2013   Lab Results  Component Value Date   CHOLHDL 4 12/31/2013   Lab Results  Component Value Date   HGBA1C 5.6 01/02/2014     ASSESSMENT AND PLAN:   1) Depression: start duloxetine  qd.  Therapeutic expectations and side effect profile of medication discussed today.  Patient's questions answered.  2) Health maintenance exam: Reviewed age and gender appropriate  health maintenance issues (prudent diet, regular exercise, health risks of tobacco and excessive alcohol, use of seatbelts, fire alarms in home, use of sunscreen).  Also reviewed age and gender appropriate health screening as well as vaccine recommendations. Fasting HP labs drawn today (she had 1/2 tsp of sugar in her coffee). DEXA ordered. Mammogram is UTD. Last Pap was 09/2013, done by her GYN MD.  Will ask for GYN records so we can try to determine when to recommend her next PAP.   Next colonoscopy due after 04/2019.  An After Visit Summary was printed and given to the patient.  FOLLOW UP:  Return in about 1 month (around 06/13/2015) for f/u depression.  Signed:  Santiago Bumpers, MD           05/14/2015

## 2015-05-14 NOTE — Progress Notes (Signed)
Pre visit review using our clinic review tool, if applicable. No additional management support is needed unless otherwise documented below in the visit note. 

## 2015-05-15 LAB — HEPATITIS C ANTIBODY: HCV AB: NEGATIVE

## 2015-05-18 ENCOUNTER — Telehealth: Payer: Self-pay | Admitting: Family Medicine

## 2015-05-18 ENCOUNTER — Encounter: Payer: Self-pay | Admitting: Family Medicine

## 2015-05-18 NOTE — Telephone Encounter (Signed)
Reviewed GYN records. Pls notify pt that her next Pap smear should be done after January 2020.-thx

## 2015-05-18 NOTE — Telephone Encounter (Signed)
Pt advised and voiced understanding.   

## 2015-05-20 ENCOUNTER — Other Ambulatory Visit: Payer: Self-pay | Admitting: *Deleted

## 2015-05-26 ENCOUNTER — Encounter: Payer: BC Managed Care – PPO | Admitting: Family Medicine

## 2015-06-02 ENCOUNTER — Ambulatory Visit
Admission: RE | Admit: 2015-06-02 | Discharge: 2015-06-02 | Disposition: A | Discharge status: Home / Self Care | Payer: BC Managed Care – PPO | Source: Ambulatory Visit | Attending: Family Medicine | Admitting: Family Medicine

## 2015-06-02 DIAGNOSIS — Z780 Asymptomatic menopausal state: Secondary | ICD-10-CM

## 2015-08-06 IMAGING — US US SOFT TISSUE HEAD/NECK
1 series · 13 of 25 positions shown · non-contrast
Comparison: None available

CLINICAL DATA: Hx of thyroid nodules, denies pain, swelling or
difficulty swallowing. Pt denies being on thyroid medication

EXAM:
THYROID ULTRASOUND
TECHNIQUE: Ultrasound examination of the thyroid gland and adjacent soft
tissues was performed.

[Series 1: us soft tissue head/neck · 0.07mm/px · 13 of 55 slices shown]
[im 1/55]
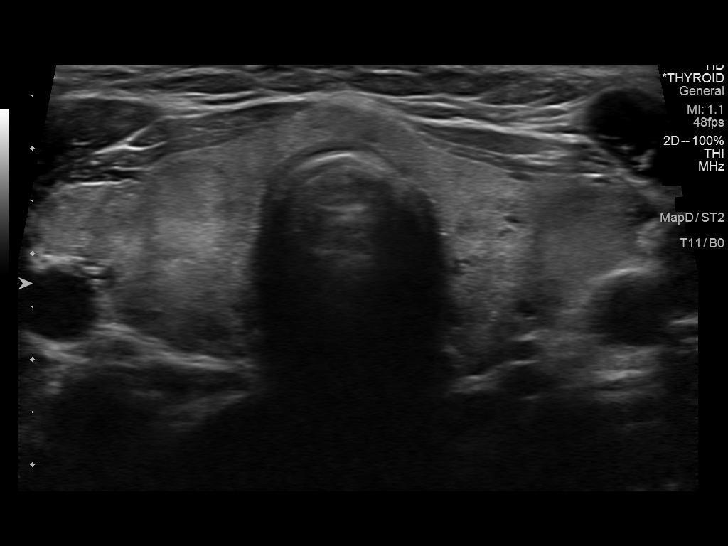
[im 5/55]
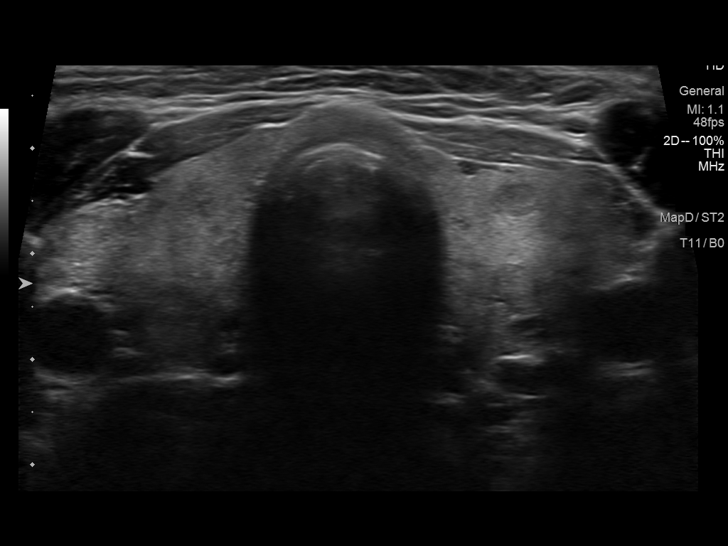
[im 10/55]
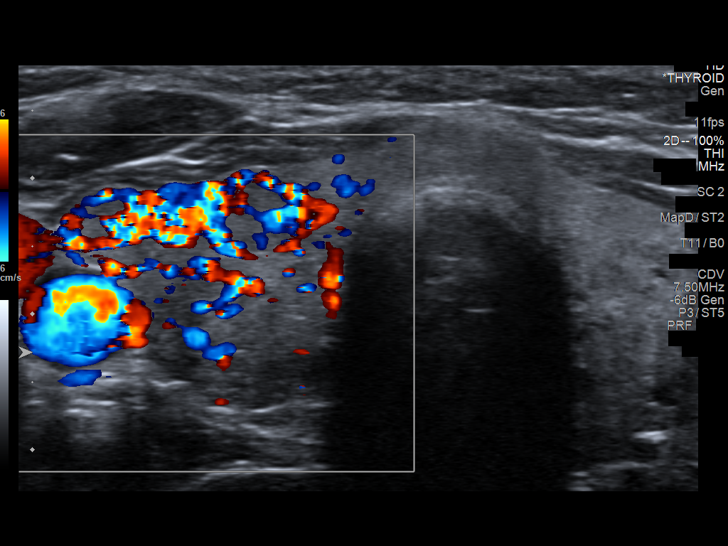
[im 14/55]
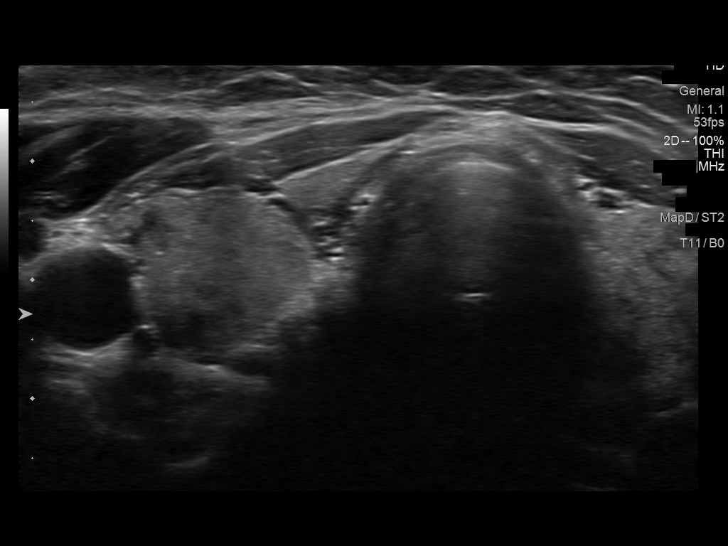
[im 19/55]
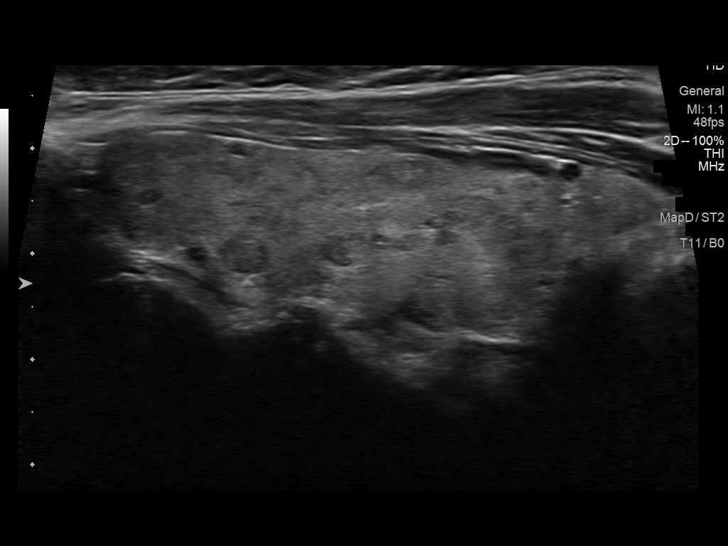
[im 23/55]
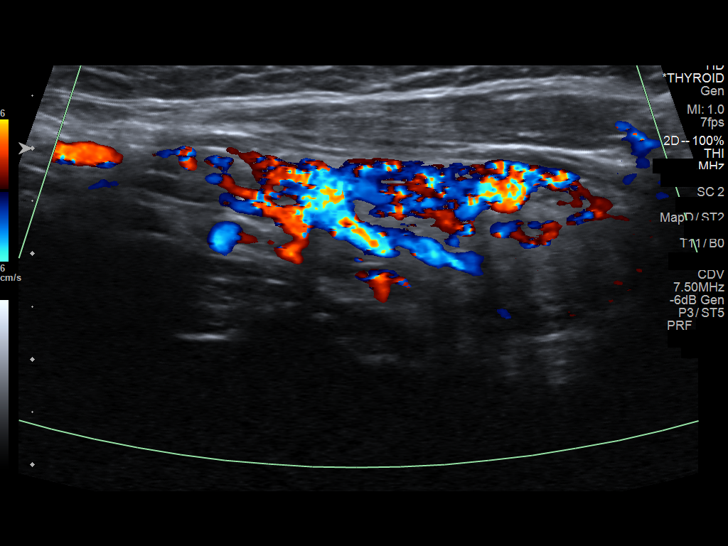
[im 28/55]
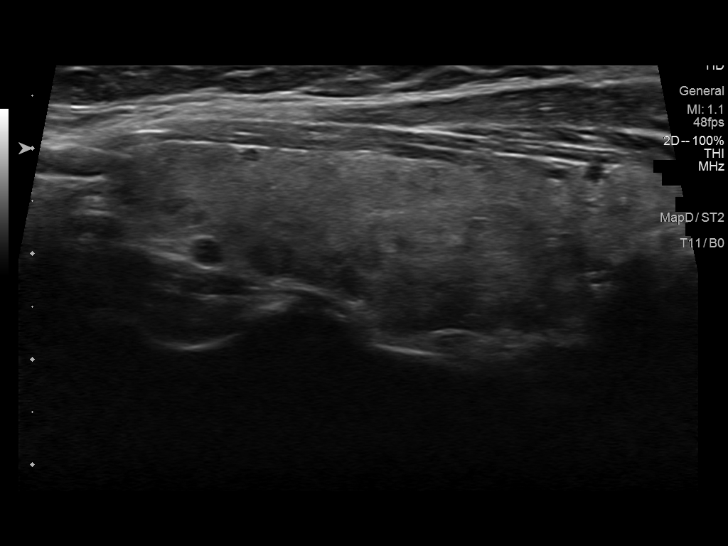
[im 32/55]
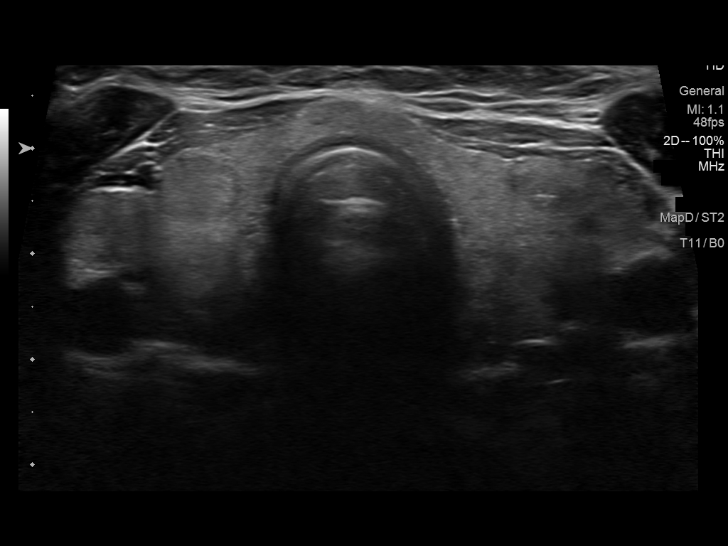
[im 37/55]
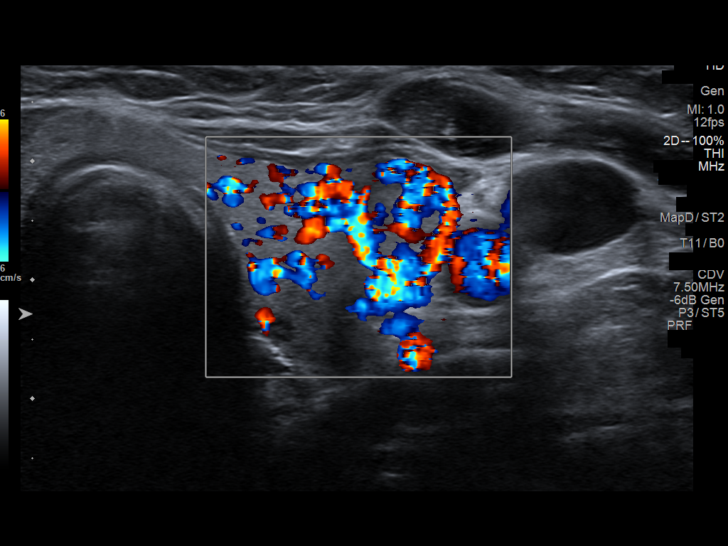
[im 41/55]
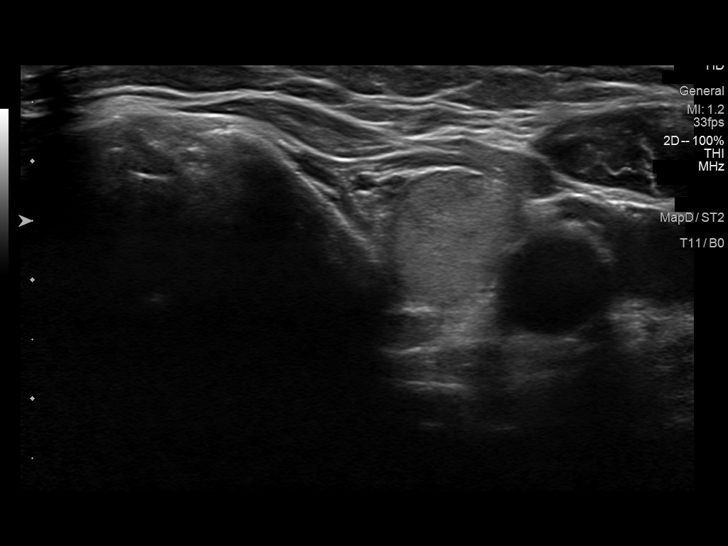
[im 46/55]
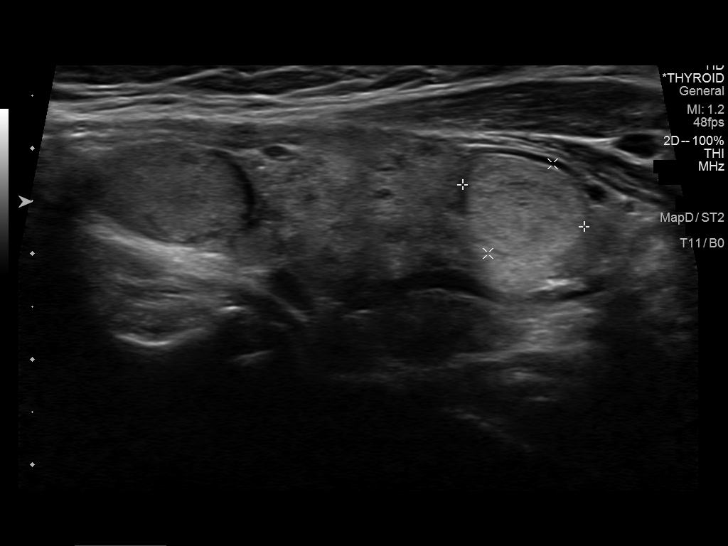
[im 50/55]
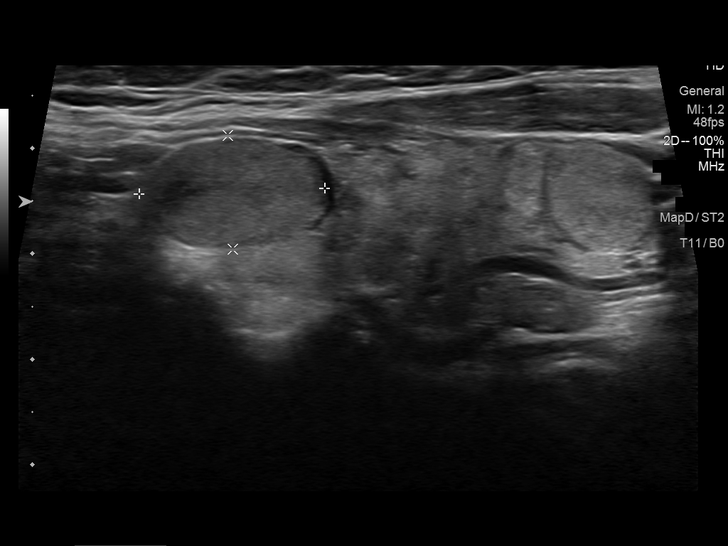
[im 55/55]
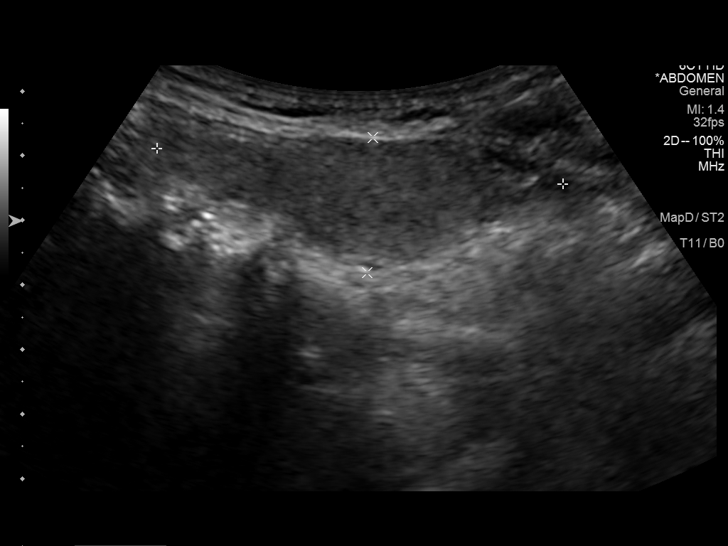

[13 of 25 positions shown; findings below may reference images not displayed]

FINDINGS: Right thyroid lobe

Measurements: 59 x 20 x 23 mm. Nodule inhomogeneous echotexture.
Largest lesion 18 x 10 x 15 mm, superior pole solid. There is an 18
x 12 x 13 mm solid lesion towards the lower pole. Background
parenchyma is type hyperemic.

Left thyroid lobe

Measurements: 63 x 21 x 19 mm. Inhomogeneous nodular background
parenchyma. There is a dominant 18 x 11 x 9 mm nodule, inferior
pole. There is a discrete 12 x 15 x 11 mm nodule in the superior
pole.

Isthmus

Thickness: 4 mm.  No nodules visualized.

Lymphadenopathy

None visualized.
IMPRESSION: 1. Thyromegaly with bilateral nodules. Dominant bilateral lesions
meet consensus criteria for biopsy. Ultrasound-guided fine needle
aspiration should be considered, as per the consensus statement:
Management of Thyroid Nodules Detected at US: Society of
Radiologists in Ultrasound Consensus Conference Statement. Radiology

## 2015-09-16 ENCOUNTER — Encounter: Payer: Self-pay | Admitting: Family Medicine

## 2015-09-16 LAB — HM MAMMOGRAPHY

## 2015-12-02 ENCOUNTER — Other Ambulatory Visit: Payer: Self-pay | Admitting: Family Medicine

## 2015-12-02 DIAGNOSIS — G4700 Insomnia, unspecified: Secondary | ICD-10-CM

## 2015-12-02 NOTE — Telephone Encounter (Signed)
RF request for ambien.  Last RX printed 03/24/15 X 5 rfs.  Last OV was 05/14/15 for CPE.   Please advise RF.

## 2015-12-02 NOTE — Telephone Encounter (Signed)
Faxed Rx to pharmacy listed.

## 2016-05-10 ENCOUNTER — Ambulatory Visit (INDEPENDENT_AMBULATORY_CARE_PROVIDER_SITE_OTHER): Payer: BC Managed Care – PPO | Admitting: Family Medicine

## 2016-05-10 ENCOUNTER — Encounter: Payer: Self-pay | Admitting: Family Medicine

## 2016-05-10 VITALS — BP 128/82 | HR 70 | Temp 98.8°F | Resp 18 | Ht 65.5 in | Wt 177.0 lb

## 2016-05-10 DIAGNOSIS — R319 Hematuria, unspecified: Secondary | ICD-10-CM | POA: Diagnosis not present

## 2016-05-10 DIAGNOSIS — R3915 Urgency of urination: Secondary | ICD-10-CM | POA: Diagnosis not present

## 2016-05-10 DIAGNOSIS — N390 Urinary tract infection, site not specified: Secondary | ICD-10-CM

## 2016-05-10 LAB — POC URINALSYSI DIPSTICK (AUTOMATED)
BILIRUBIN UA: NEGATIVE
Glucose, UA: NEGATIVE
Ketones, UA: NEGATIVE
Nitrite, UA: NEGATIVE
PH UA: 6.5 (ref 5.0–8.0)
Protein, UA: NEGATIVE
SPEC GRAV UA: 1.015 (ref 1.030–1.035)
Urobilinogen, UA: NEGATIVE (ref ?–2.0)

## 2016-05-10 MED ORDER — CIPROFLOXACIN HCL 500 MG PO TABS: 500.0000 mg | ORAL_TABLET | Freq: Two times a day (BID) | ORAL | 0 refills | Status: DC

## 2016-05-10 NOTE — Assessment & Plan Note (Signed)
Pt's sxs and UA consistent w/ infxn.  Start Cipro- pt has taken this in the past.  Send urine for culture and adjust abx prn.  Pt expressed understanding and is in agreement w/ plan.

## 2016-05-10 NOTE — Progress Notes (Signed)
   Subjective:    Patient ID: Andrea Newton, female    DOB: [birth year], 61 y.o.   MRN: [id number]  HPI UTI- sxs started Sunday night 'with that familiar feeling of a urinary tract infxn'.  Increased urgency, frequency.  No dysuria.  No fevers.  No fevers.  No suprapubic pressure.  No back pain.   Review of Systems For ROS see HPI     Objective:   Physical Exam  Constitutional: She is oriented to person, place, and time. She appears well-developed and well-nourished. No distress.  Abdominal: Soft. She exhibits no distension. There is no tenderness (no suprapubic or CVA tenderness).  Neurological: She is alert and oriented to person, place, and time.  Skin: Skin is warm and dry.  Psychiatric: She has a normal mood and affect. Her behavior is normal. Thought content normal.  Vitals reviewed.         Assessment & Plan:

## 2016-05-10 NOTE — Patient Instructions (Signed)
Follow up as needed Start the Cipro twice daily- take w/ food Drink plenty of fluids Call with any questions or concerns Happy Odis [facility]usteraster!!!

## 2016-05-13 LAB — URINE CULTURE

## 2016-05-15 ENCOUNTER — Other Ambulatory Visit: Payer: Self-pay | Admitting: *Deleted

## 2016-05-15 DIAGNOSIS — N390 Urinary tract infection, site not specified: Secondary | ICD-10-CM

## 2016-05-15 MED ORDER — CIPROFLOXACIN HCL 500 MG PO TABS: 500.0000 mg | ORAL_TABLET | Freq: Two times a day (BID) | ORAL | 0 refills | Status: DC

## 2016-06-07 ENCOUNTER — Other Ambulatory Visit: Payer: Self-pay | Admitting: Family Medicine

## 2016-06-07 DIAGNOSIS — G4700 Insomnia, unspecified: Secondary | ICD-10-CM

## 2016-06-07 NOTE — Telephone Encounter (Signed)
[facility] [place].  RF request for zolpidem LOV: 05/14/15 Next ov: None Last written: 12/02/15 #30 w/ 1BJ  Please advise. Thanks.

## 2016-06-08 NOTE — Telephone Encounter (Signed)
Rx faxed

## 2016-12-17 ENCOUNTER — Other Ambulatory Visit: Payer: Self-pay | Admitting: Family Medicine

## 2016-12-17 DIAGNOSIS — G4700 Insomnia, unspecified: Secondary | ICD-10-CM

## 2016-12-18 NOTE — Telephone Encounter (Signed)
Tried calling NA, unable to leave a message due to mail box being full.

## 2016-12-18 NOTE — Telephone Encounter (Signed)
[facility] [place]  RF request for zolpidem LOV: 05/14/15 Next ov: None Last written: 06/07/16 #30 w/ 5RF  Please advise. Thanks.

## 2016-12-18 NOTE — Telephone Encounter (Signed)
Rx faxed

## 2016-12-18 NOTE — Telephone Encounter (Signed)
Pt overdue for routine f/u insomnia. I'll do 1 mo supply of ambien, but needs o/v either for f/u insomnia OR for annual CPE within the next 1 month.-thx

## 2017-02-13 DIAGNOSIS — E7800 Pure hypercholesterolemia, unspecified: Secondary | ICD-10-CM

## 2017-02-13 HISTORY — DX: Pure hypercholesterolemia, unspecified: E78.00

## 2017-03-20 LAB — HM MAMMOGRAPHY

## 2017-07-23 ENCOUNTER — Encounter: Payer: Self-pay | Admitting: Family Medicine

## 2017-07-23 ENCOUNTER — Ambulatory Visit (INDEPENDENT_AMBULATORY_CARE_PROVIDER_SITE_OTHER): Payer: BC Managed Care – PPO | Admitting: Family Medicine

## 2017-07-23 ENCOUNTER — Telehealth: Payer: Self-pay | Admitting: *Deleted

## 2017-07-23 VITALS — BP 147/77 | HR 65 | Temp 98.3°F | Resp 16 | Ht 66.0 in | Wt 183.4 lb

## 2017-07-23 DIAGNOSIS — E663 Overweight: Secondary | ICD-10-CM | POA: Diagnosis not present

## 2017-07-23 DIAGNOSIS — E2839 Other primary ovarian failure: Secondary | ICD-10-CM | POA: Diagnosis not present

## 2017-07-23 DIAGNOSIS — G4700 Insomnia, unspecified: Secondary | ICD-10-CM | POA: Diagnosis not present

## 2017-07-23 DIAGNOSIS — Z23 Encounter for immunization: Secondary | ICD-10-CM | POA: Diagnosis not present

## 2017-07-23 DIAGNOSIS — Z0000 Encounter for general adult medical examination without abnormal findings: Secondary | ICD-10-CM | POA: Diagnosis not present

## 2017-07-23 DIAGNOSIS — Z780 Asymptomatic menopausal state: Secondary | ICD-10-CM

## 2017-07-23 DIAGNOSIS — Z114 Encounter for screening for human immunodeficiency virus [HIV]: Secondary | ICD-10-CM

## 2017-07-23 DIAGNOSIS — M810 Age-related osteoporosis without current pathological fracture: Secondary | ICD-10-CM

## 2017-07-23 LAB — COMPREHENSIVE METABOLIC PANEL
ALBUMIN: 4.3 g/dL (ref 3.5–5.2)
ALT: 19 U/L (ref 0–35)
AST: 15 U/L (ref 0–37)
Alkaline Phosphatase: 78 U/L (ref 39–117)
BUN: 14 mg/dL (ref 6–23)
CHLORIDE: 104 meq/L (ref 96–112)
CO2: 30 mEq/L (ref 19–32)
Calcium: 9.3 mg/dL (ref 8.4–10.5)
Creatinine, Ser: 0.59 mg/dL (ref 0.40–1.20)
GFR: 110.58 mL/min (ref >60.00)
GLUCOSE: 101 mg/dL — AB (ref 70–99)
POTASSIUM: 4 meq/L (ref 3.5–5.1)
SODIUM: 141 meq/L (ref 135–145)
Total Bilirubin: 0.4 mg/dL (ref 0.2–1.2)
Total Protein: 6.8 g/dL (ref 6.0–8.3)

## 2017-07-23 LAB — LIPID PANEL
Cholesterol: 222 mg/dL — ABNORMAL HIGH (ref 0–200)
HDL: 51 mg/dL (ref >39.00)
LDL CALC: 155 mg/dL — AB (ref 0–99)
NONHDL: 170.93
Total CHOL/HDL Ratio: 4
Triglycerides: 80 mg/dL (ref 0.0–149.0)
VLDL: 16 mg/dL (ref 0.0–40.0)

## 2017-07-23 LAB — CBC WITH DIFFERENTIAL/PLATELET
BASOS PCT: 1.3 % (ref 0.0–3.0)
Basophils Absolute: 0.1 10*3/uL (ref 0.0–0.1)
EOS PCT: 1.6 % (ref 0.0–5.0)
Eosinophils Absolute: 0.1 10*3/uL (ref 0.0–0.7)
HEMATOCRIT: 38.1 % (ref 36.0–46.0)
HEMOGLOBIN: 12.8 g/dL (ref 12.0–15.0)
LYMPHS PCT: 30.1 % (ref 12.0–46.0)
Lymphs Abs: 1.6 10*3/uL (ref 0.7–4.0)
MCHC: 33.7 g/dL (ref 30.0–36.0)
MCV: 91.5 fl (ref 78.0–100.0)
Monocytes Absolute: 0.3 10*3/uL (ref 0.1–1.0)
Monocytes Relative: 6.1 % (ref 3.0–12.0)
Neutro Abs: 3.2 10*3/uL (ref 1.4–7.7)
Neutrophils Relative %: 60.9 % (ref 43.0–77.0)
Platelets: 230 10*3/uL (ref 150.0–400.0)
RBC: 4.16 Mil/uL (ref 3.87–5.11)
RDW: 14 % (ref 11.5–15.5)
WBC: 5.2 10*3/uL (ref 4.0–10.5)

## 2017-07-23 LAB — TSH: TSH: 4.34 u[IU]/mL (ref 0.35–4.50)

## 2017-07-23 MED ORDER — PANTOPRAZOLE SODIUM 40 MG PO TBEC: 40.0000 mg | DELAYED_RELEASE_TABLET | Freq: Every day | ORAL | 3 refills | Status: DC

## 2017-07-23 MED ORDER — ZOLPIDEM TARTRATE 5 MG PO TABS: 5.0000 mg | ORAL_TABLET | Freq: Every evening | ORAL | 5 refills | Status: DC | PRN

## 2017-07-23 NOTE — Patient Instructions (Signed)
Buy a blood pressure cuff for your upper arm at any pharmacy. Check blood pressure a couple of times per week. Goal blood pressure is AVERAGE of 130/80 or less.    Health Maintenance, Female Adopting a healthy lifestyle and getting preventive care can go a long way to promote health and wellness. Talk with your health care provider about what schedule of regular examinations is right for you. This is a good chance for you to check in with your provider about disease prevention and staying healthy. In between checkups, there are plenty of things you can do on your own. Experts have done a lot of research about which lifestyle changes and preventive measures are most likely to keep you healthy. Ask your health care provider for more information. Weight and diet Eat a healthy diet  Be sure to include plenty of vegetables, fruits, low-fat dairy products, and lean protein.  Do not eat a lot of foods high in solid fats, added sugars, or salt.  Get regular exercise. This is one of the most important things you can do for your health. ? Most adults should exercise for at least 150 minutes each week. The exercise should increase your heart rate and make you sweat (moderate-intensity exercise). ? Most adults should also do strengthening exercises at least twice a week. This is in addition to the moderate-intensity exercise.  Maintain a healthy weight  Body mass index (BMI) is a measurement that can be used to identify possible weight problems. It estimates body fat based on height and weight. Your health care provider can help determine your BMI and help you achieve or maintain a healthy weight.  For females 82 years of age and older: ? A BMI below 18.5 is considered underweight. ? A BMI of 18.5 to 24.9 is normal. ? A BMI of 25 to 29.9 is considered overweight. ? A BMI of 30 and above is considered obese.  Watch levels of cholesterol and blood lipids  You should start having your blood tested  for lipids and cholesterol at 60 years of age, then have this test every 5 years.  You may need to have your cholesterol levels checked more often if: ? Your lipid or cholesterol levels are high. ? You are older than 60 years of age. ? You are at high risk for heart disease.  Cancer screening Lung Cancer  Lung cancer screening is recommended for adults 17-63 years old who are at high risk for lung cancer because of a history of smoking.  A yearly low-dose CT scan of the lungs is recommended for people who: ? Currently smoke. ? Have quit within the past 15 years. ? Have at least a 30-pack-year history of smoking. A pack year is smoking an average of one pack of cigarettes a day for 1 year.  Yearly screening should continue until it has been 15 years since you quit.  Yearly screening should stop if you develop a health problem that would prevent you from having lung cancer treatment.  Breast Cancer  Practice breast self-awareness. This means understanding how your breasts normally appear and feel.  It also means doing regular breast self-exams. Let your health care provider know about any changes, no matter how small.  If you are in your 20s or 30s, you should have a clinical breast exam (CBE) by a health care provider every 1-3 years as part of a regular health exam.  If you are 80 or older, have a CBE every year. Also consider  having a breast X-ray (mammogram) every year.  If you have a family history of breast cancer, talk to your health care provider about genetic screening.  If you are at high risk for breast cancer, talk to your health care provider about having an MRI and a mammogram every year.  Breast cancer gene (BRCA) assessment is recommended for women who have family members with BRCA-related cancers. BRCA-related cancers include: ? Breast. ? Ovarian. ? Tubal. ? Peritoneal cancers.  Results of the assessment will determine the need for genetic counseling and BRCA1  and BRCA2 testing.  Cervical Cancer Your health care provider may recommend that you be screened regularly for cancer of the pelvic organs (ovaries, uterus, and vagina). This screening involves a pelvic examination, including checking for microscopic changes to the surface of your cervix (Pap test). You may be encouraged to have this screening done every 3 years, beginning at age 62.  For women ages 39-65, health care providers may recommend pelvic exams and Pap testing every 3 years, or they may recommend the Pap and pelvic exam, combined with testing for human papilloma virus (HPV), every 5 years. Some types of HPV increase your risk of cervical cancer. Testing for HPV may also be done on women of any age with unclear Pap test results.  Other health care providers may not recommend any screening for nonpregnant women who are considered low risk for pelvic cancer and who do not have symptoms. Ask your health care provider if a screening pelvic exam is right for you.  If you have had past treatment for cervical cancer or a condition that could lead to cancer, you need Pap tests and screening for cancer for at least 20 years after your treatment. If Pap tests have been discontinued, your risk factors (such as having a new sexual partner) need to be reassessed to determine if screening should resume. Some women have medical problems that increase the chance of getting cervical cancer. In these cases, your health care provider may recommend more frequent screening and Pap tests.  Colorectal Cancer  This type of cancer can be detected and often prevented.  Routine colorectal cancer screening usually begins at 60 years of age and continues through 60 years of age.  Your health care provider may recommend screening at an earlier age if you have risk factors for colon cancer.  Your health care provider may also recommend using home test kits to check for hidden blood in the stool.  A small camera at  the end of a tube can be used to examine your colon directly (sigmoidoscopy or colonoscopy). This is done to check for the earliest forms of colorectal cancer.  Routine screening usually begins at age 2.  Direct examination of the colon should be repeated every 5-10 years through 60 years of age. However, you may need to be screened more often if early forms of precancerous polyps or small growths are found.  Skin Cancer  Check your skin from head to toe regularly.  Tell your health care provider about any new moles or changes in moles, especially if there is a change in a mole's shape or color.  Also tell your health care provider if you have a mole that is larger than the size of a pencil eraser.  Always use sunscreen. Apply sunscreen liberally and repeatedly throughout the day.  Protect yourself by wearing long sleeves, pants, a wide-brimmed hat, and sunglasses whenever you are outside.  Heart disease, diabetes, and high blood pressure  High blood pressure causes heart disease and increases the risk of stroke. High blood pressure is more likely to develop in: ? People who have blood pressure in the high end of the normal range (130-139/85-89 mm Hg). ? People who are overweight or obese. ? People who are African American.  If you are 32-81 years of age, have your blood pressure checked every 3-5 years. If you are 37 years of age or older, have your blood pressure checked every year. You should have your blood pressure measured twice-once when you are at a hospital or clinic, and once when you are not at a hospital or clinic. Record the average of the two measurements. To check your blood pressure when you are not at a hospital or clinic, you can use: ? An automated blood pressure machine at a pharmacy. ? A home blood pressure monitor.  If you are between 27 years and 26 years old, ask your health care provider if you should take aspirin to prevent strokes.  Have regular diabetes  screenings. This involves taking a blood sample to check your fasting blood sugar level. ? If you are at a normal weight and have a low risk for diabetes, have this test once every three years after 60 years of age. ? If you are overweight and have a high risk for diabetes, consider being tested at a younger age or more often. Preventing infection Hepatitis B  If you have a higher risk for hepatitis B, you should be screened for this virus. You are considered at high risk for hepatitis B if: ? You were born in a country where hepatitis B is common. Ask your health care provider which countries are considered high risk. ? Your parents were born in a high-risk country, and you have not been immunized against hepatitis B (hepatitis B vaccine). ? You have HIV or AIDS. ? You use needles to inject street drugs. ? You live with someone who has hepatitis B. ? You have had sex with someone who has hepatitis B. ? You get hemodialysis treatment. ? You take certain medicines for conditions, including cancer, organ transplantation, and autoimmune conditions.  Hepatitis C  Blood testing is recommended for: ? Everyone born from [birth year] through 1963. ? Anyone with known risk factors for hepatitis C.  Sexually transmitted infections (STIs)  You should be screened for sexually transmitted infections (STIs) including gonorrhea and chlamydia if: ? You are sexually active and are younger than 60 years of age. ? You are older than 60 years of age and your health care provider tells you that you are at risk for this type of infection. ? Your sexual activity has changed since you were last screened and you are at an increased risk for chlamydia or gonorrhea. Ask your health care provider if you are at risk.  If you do not have HIV, but are at risk, it may be recommended that you take a prescription medicine daily to prevent HIV infection. This is called pre-exposure prophylaxis (PrEP). You are considered at risk  if: ? You are sexually active and do not regularly use condoms or know the HIV status of your partner(s). ? You take drugs by injection. ? You are sexually active with a partner who has HIV.  Talk with your health care provider about whether you are at high risk of being infected with HIV. If you choose to begin PrEP, you should first be tested for HIV. You should then be tested every 3 months  for as long as you are taking PrEP. Pregnancy  If you are premenopausal and you may become pregnant, ask your health care provider about preconception counseling.  If you may become pregnant, take 400 to 800 micrograms (mcg) of folic acid every day.  If you want to prevent pregnancy, talk to your health care provider about birth control (contraception). Osteoporosis and menopause  Osteoporosis is a disease in which the bones lose minerals and strength with aging. This can result in serious bone fractures. Your risk for osteoporosis can be identified using a bone density scan.  If you are 65 years of age or older, or if you are at risk for osteoporosis and fractures, ask your health care provider if you should be screened.  Ask your health care provider whether you should take a calcium or vitamin D supplement to lower your risk for osteoporosis.  Menopause may have certain physical symptoms and risks.  Hormone replacement therapy may reduce some of these symptoms and risks. Talk to your health care provider about whether hormone replacement therapy is right for you. Follow these instructions at home:  Schedule regular health, dental, and eye exams.  Stay current with your immunizations.  Do not use any tobacco products including cigarettes, chewing tobacco, or electronic cigarettes.  If you are pregnant, do not drink alcohol.  If you are breastfeeding, limit how much and how often you drink alcohol.  Limit alcohol intake to no more than 1 drink per day for nonpregnant women. One drink  equals 12 ounces of beer, 5 ounces of wine, or 1 ounces of hard liquor.  Do not use street drugs.  Do not share needles.  Ask your health care provider for help if you need support or information about quitting drugs.  Tell your health care provider if you often feel depressed.  Tell your health care provider if you have ever been abused or do not feel safe at home. This information is not intended to replace advice given to you by your health care provider. Make sure you discuss any questions you have with your health care provider. Document Released: 08/15/2010 Document Revised: 07/08/2015 Document Reviewed: 11/03/2014 Elsevier Interactive Patient Education  2018 Elsevier Inc.  

## 2017-07-23 NOTE — Telephone Encounter (Signed)
Pt requesting Rx for pantoprazole. Please advise. Thanks.

## 2017-07-23 NOTE — Addendum Note (Signed)
Addended by: Smitty KnudsenSUTHERLAND, Cali Hope K on: 07/23/2017 10:00 AM   Modules accepted: Orders

## 2017-07-23 NOTE — Telephone Encounter (Signed)
OK, pantoprazole eRx'd.

## 2017-07-23 NOTE — Progress Notes (Signed)
Office Note 07/23/2017  CC:  Chief Complaint  Patient presents with  . Annual Exam    Pt is fasting.     HPI:  Andrea HomerMartha Arai is a 62 y.o. White female who is here for annual health maintenance exam. 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  Exercise: 6000 steps per day.  Otherwise no formal regimen. Diet: always working on it; wt watchers on/off.  Some yo-yo'ing.      Past Medical History:  Diagnosis Date  . Bleeding internal hemorrhoids 2016  . Constipation   . GERD (gastroesophageal reflux disease)   . History of adenomatous polyp of colon 04/2014   x 1: recall 5 yrs ([facility])  . Infectious mononucleosis hepatitis college  . Multiple thyroid nodules 2010 approx; 2015   Euthyroid.  Saw ENDO (Dr. Jimmye NormanFarmer).  Repeat thyroid u/s 12/2013 showed thyromegaly with multiple nodules meeting biopsy criteria--sent pt to Dr. Elvera LennoxGherghe and plan for repeat u/s in 6 mo was made, possible bx at that time.  Labs 03/2014 showed elevted TPO ab, supportive of Hashimoto's thyroiditis--a reassuring finding.  Pt euthyroid.  . Osteoarthritis    knees>hips.  Takes no meds.  . Postmenopausal status 04/2010   FSH and LH elevated, estrogen low.  . Recurrent UTI     Past Surgical History:  Procedure Laterality Date  . BIOPSY THYROID  11/05/14   Scant follicular epithelium: benign  . Cervical cancer screening  03/11/13   No hx of abnormals.  Neg pap and Neg HR HPV testing: next pap should be after 03/11/2018 and should include HR HPV testing as well.  . COLONOSCOPY W/ POLYPECTOMY  05/2011; 04/2014   IH and 'tics (Dr. Rhetta MuraSears w/ [facility]).  Polyp 04/2014= tubular adenoma (recall 5 yrs)    . DEXA  05/2015   T score -2.1: osteopenia, fracture risk not high enough to recommend anything other than calcium and vit D  . ENDOMETRIAL BIOPSY  2006   Performed b/c of abnormal uterine bleeding: normal (fragmented INACTIVE endometrium).  [facility] [place].   Marland Kitchen. HEMORRHOID BANDING  April and May 2016   [facility]  . LASIK  2002    Family History  Problem Relation Age of Onset  . Arthritis Mother        Osteo (bilat hip replacements)  . Cancer Maternal Grandmother        ovarian  . Heart disease Maternal Grandmother   . Stroke Maternal Grandmother     Social History   Socioeconomic History  . Marital status: Married    Spouse name: Not on file  . Number of children: Not on file  . Years of education: Not on file  . Highest education level: Not on file  Occupational History  . Not on file  Social Needs  . Financial resource strain: Not on file  . Food insecurity:    Worry: Not on file    Inability: Not on file  . Transportation needs:    Medical: Not on file    Non-medical: Not on file  Tobacco Use  . Smoking status: Never Smoker  . Smokeless tobacco: Never Used  Substance and Sexual Activity  . Alcohol use: Yes  . Drug use: No  . Sexual activity: Not on file  Lifestyle  . Physical activity:    Days per week: Not on file    Minutes per session: Not on file  . Stress: Not on file  Relationships  .  Social connections:    Talks on phone: Not on file    Gets together: Not on file    Attends religious service: Not on file    Active member of club or organization: Not on file    Attends meetings of clubs or organizations: Not on file    Relationship status: Not on file  . Intimate partner violence:    Fear of current or ex partner: Not on file    Emotionally abused: Not on file    Physically abused: Not on file    Forced sexual activity: Not on file  Other Topics Concern  . Not on file  Social History Narrative   Married, 2 children (son Jetter in his 66s, daughter age 16 at [employer]).   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   Native [address], still lives on [place]'s farm in [place], [name].   No tobacco, rare alcohol, no drugs.  No siblings.   No exercise but "active".     Outpatient Medications Prior to Visit  Medication Sig Dispense Refill  . cholecalciferol (VITAMIN D) 1000 UNITS tablet Take 5,000 Units by mouth daily.     . Ciclopirox 1 % shampoo     . loratadine (CLARITIN) 10 MG tablet Take 10 mg by mouth daily.    . Omeprazole 20 MG TBEC Take 1 tablet by mouth every other day.    . Probiotic Product (PROBIOTIC DAILY PO) Take 1 capsule by mouth daily.    . vitamin B-12 (CYANOCOBALAMIN) 250 MCG tablet Take 250 mcg by mouth daily.    Marland Kitchen zolpidem (AMBIEN) 5 MG tablet TAKE 1 TABLET BY MOUTH AT BEDTIME AS NEEDED FOR SLEEP 30 tablet 0  . calcium & magnesium carbonates (MYLANTA) 161-096 MG per tablet Take 1 tablet by mouth daily.    . cetirizine (ZYRTEC) 10 MG tablet Take 10 mg by mouth daily.    . ciprofloxacin (CIPRO) 500 MG tablet Take 1 tablet (500 mg total) by mouth 2 (two) times daily. (Patient not taking: Reported on 07/23/2017) 4 tablet 0  . DULoxetine (CYMBALTA) 30 MG capsule Take 1 capsule (30 mg total) by mouth daily. (Patient not taking: Reported on 07/23/2017) 30 capsule 1  . fluticasone (FLONASE) 50 MCG/ACT nasal spray     . selenium sulfide (SELSUN) 2.5 % shampoo Apply 1 application topically every other day. (Patient not taking: Reported on 07/23/2017) 118 mL 0   No facility-administered medications prior to visit.     Allergies  Allergen Reactions  . Penicillins Rash    ROS Review of Systems  Constitutional: Negative for appetite change, chills, fatigue and fever.  HENT: Negative for congestion, dental problem, ear pain and sore throat.   Eyes: Negative for discharge, redness and visual disturbance.  Respiratory: Negative for cough, chest tightness, shortness of breath and wheezing.   Cardiovascular: Negative for chest pain, palpitations and leg swelling.  Gastrointestinal: Negative for abdominal pain, blood in stool, diarrhea, nausea and vomiting.  Genitourinary: Negative for difficulty urinating, dysuria, flank pain, frequency,  hematuria and urgency.  Musculoskeletal: Negative for arthralgias, back pain, joint swelling, myalgias and neck stiffness.  Skin: Negative for pallor and rash.  Neurological: Negative for dizziness, speech difficulty, weakness and headaches.  Hematological: Negative for adenopathy. Does not bruise/bleed easily.  Psychiatric/Behavioral: Negative for confusion and sleep disturbance. The patient is not nervous/anxious.     PE; Blood pressure (!) 147/77, pulse 65, temperature 98.3 F (36.8 C), temperature source Oral, resp. rate 16,  height 5\' 6"  (1.676 m), weight 183 lb 6 oz (83.2 kg), last menstrual period 02/13/2010, SpO2 97 %. Body mass index is 29.6 kg/m. Exam chaperoned by Vanetta Mulders, CMA.  Gen: Alert, well appearing.  Patient is oriented to person, place, time, and situation. AFFECT: pleasant, lucid thought and speech. ENT: Ears: EACs clear, normal epithelium.  TMs with good light reflex and landmarks bilaterally.  Eyes: no injection, icteris, swelling, or exudate.  EOMI, PERRLA. Nose: no drainage or turbinate edema/swelling.  No injection or focal lesion.  Mouth: lips without lesion/swelling.  Oral mucosa pink and moist.  Dentition intact and without obvious caries or gingival swelling.  Oropharynx without erythema, exudate, or swelling.  Neck: supple/nontender.  No LAD, mass, or TM.  Carotid pulses 2+ bilaterally, without bruits. CV: RRR, no m/r/g.   LUNGS: CTA bilat, nonlabored resps, good aeration in all lung fields. ABD: soft, NT, ND, BS normal.  No hepatospenomegaly or mass.  No bruits. EXT: no clubbing, cyanosis, or edema.  Musculoskeletal: no joint swelling, erythema, warmth, or tenderness.  ROM of all joints intact. Skin - no sores or suspicious lesions or rashes or color changes   Pertinent labs:  Lab Results  Component Value Date   TSH 3.16 05/14/2015   Lab Results  Component Value Date   WBC 4.4 05/14/2015   HGB 12.6 05/14/2015   HCT 37.8 05/14/2015   MCV 89.7  05/14/2015   PLT 253.0 05/14/2015   Lab Results  Component Value Date   CREATININE 0.66 05/14/2015   BUN 18 05/14/2015   NA 141 05/14/2015   K 4.1 05/14/2015   CL 105 05/14/2015   CO2 30 05/14/2015   Lab Results  Component Value Date   ALT 16 05/14/2015   AST 14 05/14/2015   ALKPHOS 81 05/14/2015   BILITOT 0.4 05/14/2015   Lab Results  Component Value Date   CHOL 214 (H) 05/14/2015   Lab Results  Component Value Date   HDL 52.00 05/14/2015   Lab Results  Component Value Date   LDLCALC 148 (H) 05/14/2015   Lab Results  Component Value Date   TRIG 68.0 05/14/2015   Lab Results  Component Value Date   CHOLHDL 4 05/14/2015   Lab Results  Component Value Date   HGBA1C 5.6 01/02/2014    ASSESSMENT AND PLAN:   Health maintenance exam: Reviewed age and gender appropriate health maintenance issues (prudent diet, regular exercise, health risks of tobacco and excessive alcohol, use of seatbelts, fire alarms in home, use of sunscreen).  Also reviewed age and gender appropriate health screening as well as vaccine recommendations. Vaccines: UTD.  Shingrix #1 today. Labs: fasting HP + HIV screening today. Cervical ca screening: next pap due after 03/11/2018.  She'll get that here at next annual CPE. Breast ca screening: last mammogram about 6 mo ago per pt ([facility])-->will get record of this. DEXA: due for 2 yr repeat of osteopenia now-->ordered. Colon ca screening: next colonoscopy due 04/2019.  An After Visit Summary was printed and given to the patient.  FOLLOW UP:  Return in about 1 year (around 07/24/2018) for annual CPE (fasting)-with pap/pelvic/breast exam.  Signed:  Santiago Bumpers, MD           07/23/2017

## 2017-07-23 NOTE — Telephone Encounter (Signed)
Pt advised and voiced understanding.   

## 2017-07-24 ENCOUNTER — Encounter: Payer: Self-pay | Admitting: Family Medicine

## 2017-07-24 LAB — HIV ANTIBODY (ROUTINE TESTING W REFLEX): HIV 1&2 Ab, 4th Generation: NONREACTIVE

## 2017-07-31 ENCOUNTER — Telehealth: Payer: Self-pay | Admitting: Family Medicine

## 2017-07-31 ENCOUNTER — Ambulatory Visit
Admission: RE | Admit: 2017-07-31 | Discharge: 2017-07-31 | Disposition: A | Discharge status: Home / Self Care | Payer: BC Managed Care – PPO | Source: Ambulatory Visit | Attending: Family Medicine | Admitting: Family Medicine

## 2017-07-31 ENCOUNTER — Encounter: Payer: Self-pay | Admitting: Family Medicine

## 2017-07-31 DIAGNOSIS — M810 Age-related osteoporosis without current pathological fracture: Principal | ICD-10-CM

## 2017-07-31 DIAGNOSIS — Z780 Asymptomatic menopausal state: Secondary | ICD-10-CM

## 2017-07-31 DIAGNOSIS — E2839 Other primary ovarian failure: Secondary | ICD-10-CM

## 2017-07-31 NOTE — Telephone Encounter (Signed)
Bone density test showed some bone thinning but not osteoporosis.  Her fracture risk is not high enough that medication is needed.  Continue vit D and calcium supplements and plan repeat bone density testing in 2 yrs.

## 2017-07-31 NOTE — Telephone Encounter (Signed)
Patient notified and verbalized understanding. 

## 2017-10-24 ENCOUNTER — Ambulatory Visit: Payer: BC Managed Care – PPO

## 2017-10-29 ENCOUNTER — Ambulatory Visit (INDEPENDENT_AMBULATORY_CARE_PROVIDER_SITE_OTHER): Payer: BC Managed Care – PPO | Admitting: *Deleted

## 2017-10-29 DIAGNOSIS — Z23 Encounter for immunization: Secondary | ICD-10-CM | POA: Diagnosis not present

## 2017-10-29 NOTE — Progress Notes (Signed)
Patient presents today for Shingrix number 2. Patient tolerated injection well.

## 2017-11-22 ENCOUNTER — Ambulatory Visit (INDEPENDENT_AMBULATORY_CARE_PROVIDER_SITE_OTHER): Payer: BC Managed Care – PPO | Admitting: *Deleted

## 2017-11-22 DIAGNOSIS — Z23 Encounter for immunization: Secondary | ICD-10-CM

## 2018-01-21 ENCOUNTER — Other Ambulatory Visit: Payer: Self-pay | Admitting: Family Medicine

## 2018-01-21 DIAGNOSIS — G4700 Insomnia, unspecified: Secondary | ICD-10-CM

## 2018-04-30 LAB — HM MAMMOGRAPHY

## 2018-05-01 ENCOUNTER — Encounter: Payer: Self-pay | Admitting: Family Medicine

## 2018-07-01 ENCOUNTER — Telehealth: Payer: Self-pay

## 2018-07-01 NOTE — Telephone Encounter (Signed)
SW pt to confirm results received from [facility] were for her. Name showed up slightly different than in chart. Copied from CRM [id number]. Topic: General - Call Back - No Documentation >> Jul 01, 2018  9:44 AM Lorrine Kin, NT wrote: Reason for CRM: Patient calling and states that she received a call from the office, no voicemail left. Please advise. Did not wish to hold for someone in the office.

## 2018-07-23 ENCOUNTER — Other Ambulatory Visit: Payer: Self-pay | Admitting: Family Medicine

## 2018-07-23 DIAGNOSIS — G4700 Insomnia, unspecified: Secondary | ICD-10-CM

## 2018-07-23 NOTE — Telephone Encounter (Signed)
Pt has enough until appt w/ PCP on 6/17. Will hold off until then

## 2018-07-29 ENCOUNTER — Encounter: Payer: BC Managed Care – PPO | Admitting: Family Medicine

## 2018-07-31 ENCOUNTER — Ambulatory Visit: Payer: BC Managed Care – PPO | Admitting: Family Medicine

## 2018-07-31 ENCOUNTER — Other Ambulatory Visit: Payer: Self-pay | Admitting: Family Medicine

## 2018-07-31 ENCOUNTER — Other Ambulatory Visit (HOSPITAL_COMMUNITY)
Admission: RE | Admit: 2018-07-31 | Discharge: 2018-07-31 | Disposition: A | Discharge status: Home / Self Care | Payer: BC Managed Care – PPO | Source: Ambulatory Visit | Attending: Family Medicine | Admitting: Family Medicine

## 2018-07-31 ENCOUNTER — Other Ambulatory Visit: Payer: Self-pay

## 2018-07-31 ENCOUNTER — Encounter: Payer: Self-pay | Admitting: Family Medicine

## 2018-07-31 VITALS — BP 138/86 | HR 75 | Temp 97.9°F | Resp 16 | Ht 66.0 in | Wt 186.8 lb

## 2018-07-31 DIAGNOSIS — Z0000 Encounter for general adult medical examination without abnormal findings: Secondary | ICD-10-CM | POA: Diagnosis not present

## 2018-07-31 DIAGNOSIS — I10 Essential (primary) hypertension: Secondary | ICD-10-CM | POA: Diagnosis not present

## 2018-07-31 DIAGNOSIS — Z124 Encounter for screening for malignant neoplasm of cervix: Secondary | ICD-10-CM | POA: Insufficient documentation

## 2018-07-31 DIAGNOSIS — E049 Nontoxic goiter, unspecified: Secondary | ICD-10-CM | POA: Diagnosis not present

## 2018-07-31 LAB — CBC WITH DIFFERENTIAL/PLATELET
Basophils Absolute: 0 10*3/uL (ref 0.0–0.1)
Basophils Relative: 0.9 % (ref 0.0–3.0)
Eosinophils Absolute: 0.1 10*3/uL (ref 0.0–0.7)
Eosinophils Relative: 1.6 % (ref 0.0–5.0)
HCT: 39.5 % (ref 36.0–46.0)
Hemoglobin: 13.2 g/dL (ref 12.0–15.0)
Lymphocytes Relative: 28.5 % (ref 12.0–46.0)
Lymphs Abs: 1.5 10*3/uL (ref 0.7–4.0)
MCHC: 33.5 g/dL (ref 30.0–36.0)
MCV: 91.6 fl (ref 78.0–100.0)
Monocytes Absolute: 0.2 10*3/uL (ref 0.1–1.0)
Monocytes Relative: 4.8 % (ref 3.0–12.0)
Neutro Abs: 3.3 10*3/uL (ref 1.4–7.7)
Neutrophils Relative %: 64.2 % (ref 43.0–77.0)
Platelets: 235 10*3/uL (ref 150.0–400.0)
RBC: 4.31 Mil/uL (ref 3.87–5.11)
RDW: 13.7 % (ref 11.5–15.5)
WBC: 5.2 10*3/uL (ref 4.0–10.5)

## 2018-07-31 LAB — COMPREHENSIVE METABOLIC PANEL
ALT: 17 U/L (ref 0–35)
AST: 12 U/L (ref 0–37)
Albumin: 4.4 g/dL (ref 3.5–5.2)
Alkaline Phosphatase: 87 U/L (ref 39–117)
BUN: 14 mg/dL (ref 6–23)
CO2: 31 mEq/L (ref 19–32)
Calcium: 9.6 mg/dL (ref 8.4–10.5)
Chloride: 103 mEq/L (ref 96–112)
Creatinine, Ser: 0.6 mg/dL (ref 0.40–1.20)
GFR: 101.69 mL/min (ref >60.00)
Glucose, Bld: 89 mg/dL (ref 70–99)
Potassium: 4.1 mEq/L (ref 3.5–5.1)
Sodium: 141 mEq/L (ref 135–145)
Total Bilirubin: 0.4 mg/dL (ref 0.2–1.2)
Total Protein: 6.8 g/dL (ref 6.0–8.3)

## 2018-07-31 LAB — LIPID PANEL
Cholesterol: 222 mg/dL — ABNORMAL HIGH (ref 0–200)
HDL: 48.7 mg/dL (ref >39.00)
LDL Cholesterol: 148 mg/dL — ABNORMAL HIGH (ref 0–99)
NonHDL: 172.96
Total CHOL/HDL Ratio: 5
Triglycerides: 123 mg/dL (ref 0.0–149.0)
VLDL: 24.6 mg/dL (ref 0.0–40.0)

## 2018-07-31 LAB — TSH: TSH: 3.47 u[IU]/mL (ref 0.35–4.50)

## 2018-07-31 LAB — T4, FREE: Free T4: 0.77 ng/dL (ref 0.60–1.60)

## 2018-07-31 MED ORDER — HYDROCHLOROTHIAZIDE 25 MG PO TABS: 25.0000 mg | ORAL_TABLET | Freq: Every day | ORAL | 0 refills | Status: DC

## 2018-07-31 NOTE — Telephone Encounter (Signed)
Patient seen today.  Last RX 01/21/2018 # 30 x 5 RF.  Please advise.

## 2018-07-31 NOTE — Progress Notes (Addendum)
Office Note 07/31/2018  CC:  Chief Complaint  Patient presents with  . Annual Exam    pt is fasting    HPI:  Andrea Newton is a 63 y.o. White female who is here for annual health maintenance exam, includin pap/pelvic exam.  Breasts: had normal mammo 04/2018 and declines clinical breast exam today.  GYN: no hx of abnormal paps, most recent pap approx 4 yrs ago.  No FH cerv ca.  After last CPE she started working on TLC but has gained 3 more pounds.  Home bp check 140 or more, diast not recalled.   Her bp cuff was compared to our manual cuff here today and correlated well.    Past Medical History:  Diagnosis Date  . Bleeding internal hemorrhoids 2016  . Borderline hypercholesterolemia 2019   10 yr Framingham cv risk = 4.5%.  TLC recommended.  . Constipation   . GERD (gastroesophageal reflux disease)   . History of adenomatous polyp of colon 04/2014   x 1: recall 5 yrs ([facility])  . Infectious mononucleosis hepatitis college  . Multiple thyroid nodules 2010 approx; 2015   Euthyroid.  Saw ENDO (Dr. Mare Ferrari).  Repeat thyroid u/s 12/2013 showed thyromegaly with multiple nodules meeting biopsy criteria--sent pt to Dr. Cruzita Lederer and plan for repeat u/s in 6 mo was made, possible bx at that time.  Labs 03/2014 showed elevted TPO ab, supportive of Hashimoto's thyroiditis--a reassuring finding.  Pt euthyroid.  . Osteoarthritis    knees>hips.  Takes no meds.  . Osteopenia 2017; 2019   Stable DEXA results 2017-2019.  Marland Kitchen Postmenopausal status 04/2010   FSH and LH elevated, estrogen low.  . Recurrent UTI     Past Surgical History:  Procedure Laterality Date  . BIOPSY THYROID  3/41/93   Scant follicular epithelium: benign  . Cervical cancer screening  03/11/13   No hx of abnormals.  Neg pap and Neg HR HPV testing: next pap should be after 03/11/2018 and should include HR HPV testing as well.  . COLONOSCOPY W/ POLYPECTOMY  05/2011; 04/2014   IH and 'tics ([facility]an [facility]).  Polyp 04/2014= tubular adenoma (recall 5 yrs)    . DEXA  05/2015; 07/2017   T score -2.1: osteopenia, fracture risk not high enough to recommend anything other than calcium and vit D.  June 2019: T-score -2.1.  . ENDOMETRIAL BIOPSY  2006   Performed b/c of abnormal uterine bleeding: normal (fragmented INACTIVE endometrium).  [place], [place].  Marland Kitchen HEMORRHOID BANDING  April and May 2016   [facility]  . LASIK  2002    Family History  Problem Relation Age of Onset  . Arthritis Mother        Osteo (bilat hip replacements)  . Cancer Maternal Grandmother        ovarian  . Heart disease Maternal Grandmother   . Stroke Maternal Grandmother     Social History   Socioeconomic History  . Marital status: Married    Spouse name: Not on file  . Number of children: Not on file  . Years of education: Not on file  . Highest education level: Not on file  Occupational History  . Not on file  Social Needs  . Financial resource strain: Not on file  . Food insecurity    Worry: Not on file    Inability: Not on file  . Transportation needs    Medical: Not on file    Non-medical:  Not on file  Tobacco Use  . Smoking status: Never Smoker  . Smokeless tobacco: Never Used  Substance and Sexual Activity  . Alcohol use: Yes    Alcohol/week: 1.0 standard drinks    Types: 1 Glasses of wine per week    Comment: socially  . Drug use: No  . Sexual activity: Not on file  Lifestyle  . Physical activity    Days per week: Not on file    Minutes per session: Not on file  . Stress: Not on file  Relationships  . Social Musicianconnections    Talks on phone: Not on file    Gets together: Not on file    Attends religious service: Not on file    Active member of club or organization: Not on file    Attends meetings of clubs or organizations: Not on file    Relationship status: Not on file  . Intimate partner violence    Fear of current or ex partner: Not on file     Emotionally abused: Not on file    Physically abused: Not on file    Forced sexual activity: Not on file  Other Topics Concern  . Not on file  Social History Narrative   Married, 2 children (son Cristal DeerChristopher in his 7520s, daughter age 61 at [employer]).   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   [place], [place], [place]   No tobacco, rare alcohol, no drugs.  No siblings.   No exercise but "active".    Outpatient Medications Prior to Visit  Medication Sig Dispense Refill  . Calcium Carbonate (CALCIUM 600 PO) Take 600 mg by mouth daily. Take 1-2  tablets once daily.    . cholecalciferol (VITAMIN D) 1000 UNITS tablet Take 5,000 Units by mouth daily.     . Ciclopirox 1 % shampoo     . loratadine (CLARITIN) 10 MG tablet Take 10 mg by mouth daily.    . Multiple Vitamins-Minerals (PRESERVISION AREDS 2+MULTI VIT PO)     . pantoprazole (PROTONIX) 40 MG tablet Take 1 tablet (40 mg total) by mouth daily. 90 tablet 3  . Probiotic Product (PROBIOTIC DAILY PO) Take 1 capsule by mouth daily.    . vitamin B-12 (CYANOCOBALAMIN) 250 MCG tablet Take 250 mcg by mouth daily.    Marland Kitchen. zolpidem (AMBIEN) 5 MG tablet TAKE 1 TABLET BY MOUTH EVERY NIGHT AT BEDTIME AS NEEDED FOR SLEEP 30 tablet 5   No facility-administered medications prior to visit.     Allergies  Allergen Reactions  . Penicillins Rash    ROS Review of Systems  Constitutional: Negative for appetite change, chills, fatigue and fever.  HENT: Negative for congestion, dental problem, ear pain and sore throat.   Eyes: Negative for discharge, redness and visual disturbance.  Respiratory: Negative for cough, chest tightness, shortness of breath and wheezing.   Cardiovascular: Negative for chest pain, palpitations and leg swelling.  Gastrointestinal: Negative for abdominal pain, blood in stool, diarrhea, nausea and vomiting.  Genitourinary: Negative for difficulty urinating, dysuria,  flank pain, frequency, hematuria and urgency.  Musculoskeletal: Negative for arthralgias, back pain, joint swelling, myalgias and neck stiffness.  Skin: Negative for pallor and rash.  Neurological: Negative for dizziness, speech difficulty, weakness and headaches.  Hematological: Negative for adenopathy. Does not bruise/bleed easily.  Psychiatric/Behavioral: Negative for confusion and sleep disturbance. The patient is not nervous/anxious.     PE; Blood pressure 138/86, pulse 75,  temperature 97.9 F (36.6 C), temperature source Temporal, resp. rate 16, height 5\' 6"  (1.676 m), weight 186 lb 12.8 oz (84.7 kg), last menstrual period 02/13/2010, SpO2 98 %. Exam chaperoned by Emi HolesBrittanae Staton, CMA. Gen: Alert, well appearing.  Patient is oriented to person, place, time, and situation. AFFECT: pleasant, lucid thought and speech. ENT: Ears: EACs clear, normal epithelium.  TMs with good light reflex and landmarks bilaterally.  Eyes: no injection, icteris, swelling, or exudate.  EOMI, PERRLA. Nose: no drainage or turbinate edema/swelling.  No injection or focal lesion.  Mouth: lips without lesion/swelling.  Oral mucosa pink and moist.  Dentition intact and without obvious caries or gingival swelling.  Oropharynx without erythema, exudate, or swelling.  Neck: supple/nontender.  No LAD, mass.  She has moderate sized, palpable thyroid that appears symmetric and a little irregular with palpation--c/w her known multinodular goiter.  Carotid pulses 2+ bilaterally, without bruits. CV: RRR, no m/r/g.   LUNGS: CTA bilat, nonlabored resps, good aeration in all lung fields. ABD: soft, NT, ND, BS normal.  No hepatospenomegaly or mass.  No bruits. EXT: no clubbing, cyanosis, or edema.  Musculoskeletal: no joint swelling, erythema, warmth, or tenderness.  ROM of all joints intact. Skin - no sores or suspicious lesions or rashes or color changes Vagina and vulva are normal;  no discharge is noted.  Cervix normal  without lesions. Uterus anteverted and mobile, normal in size and shape without tenderness.  Adnexa normal in size without masses or tenderness. Pap Smear - is completed today. Exam chaperoned by female assistant.   Pertinent labs:  Lab Results  Component Value Date   TSH 4.34 07/23/2017   Lab Results  Component Value Date   WBC 5.2 07/23/2017   HGB 12.8 07/23/2017   HCT 38.1 07/23/2017   MCV 91.5 07/23/2017   PLT 230.0 07/23/2017   Lab Results  Component Value Date   CREATININE 0.59 07/23/2017   BUN 14 07/23/2017   NA 141 07/23/2017   K 4.0 07/23/2017   CL 104 07/23/2017   CO2 30 07/23/2017   Lab Results  Component Value Date   ALT 19 07/23/2017   AST 15 07/23/2017   ALKPHOS 78 07/23/2017   BILITOT 0.4 07/23/2017   Lab Results  Component Value Date   CHOL 222 (H) 07/23/2017   Lab Results  Component Value Date   HDL 51.00 07/23/2017   Lab Results  Component Value Date   LDLCALC 155 (H) 07/23/2017   Lab Results  Component Value Date   TRIG 80.0 07/23/2017   Lab Results  Component Value Date   CHOLHDL 4 07/23/2017   Lab Results  Component Value Date   HGBA1C 5.6 01/02/2014     ASSESSMENT AND PLAN:   1) Ess hypertenstion: failed 1 yr of TLC. Start hctz 25mg  qd.  Therapeutic expectations and side effect profile of medication discussed today.  Patient's questions answered. Instructions: Check your blood pressure and heart rate once a day and write these numbers down and bring to follow up appointment in 2 wks to review with me.   2) Health maintenance exam: Reviewed age and gender appropriate health maintenance issues (prudent diet, regular exercise, health risks of tobacco and excessive alcohol, use of seatbelts, fire alarms in home, use of sunscreen).  Also reviewed age and gender appropriate health screening as well as vaccine recommendations. Vaccines: all UTD. Labs: fasting HP labs ordered. Cervical ca screening: Dr. Colette RibasLyndhurst was her GYN in the  past.  Pt's due for  Pap-->sent pap with HR HPV testing for any pap result today. Breast ca screening: UTD.  Next one due 04/2019. Colon ca screening: next colonoscopy due 04/2019. DEXA: repeat 07/2019.  3) Hx of multinodular goiter, benign bx of one of the nodules 2016. Needs routine thyroid lab testing (today) and I encouraged her to arrange f/u appt with her endocrinologist, Dr. Elvera LennoxGherghe.  An After Visit Summary was printed and given to the patient.  FOLLOW UP:  Return in about 2 weeks (around 08/14/2018) for f/u HTN.  Signed:  Santiago BumpersPhil Chaney Maclaren, MD           07/31/2018

## 2018-07-31 NOTE — Patient Instructions (Addendum)
Check your blood pressure and heart rate once a day and write these numbers down and bring to follow up appointment in 2 wks to review with me.  Health Maintenance, Female Adopting a healthy lifestyle and getting preventive care can go a long way to promote health and wellness. Talk with your health care provider about what schedule of regular examinations is right for you. This is a good chance for you to check in with your provider about disease prevention and staying healthy. In between checkups, there are plenty of things you can do on your own. Experts have done a lot of research about which lifestyle changes and preventive measures are most likely to keep you healthy. Ask your health care provider for more information. Weight and diet Eat a healthy diet  Be sure to include plenty of vegetables, fruits, low-fat dairy products, and lean protein.  Do not eat a lot of foods high in solid fats, added sugars, or salt.  Get regular exercise. This is one of the most important things you can do for your health. ? Most adults should exercise for at least 150 minutes each week. The exercise should increase your heart rate and make you sweat (moderate-intensity exercise). ? Most adults should also do strengthening exercises at least twice a week. This is in addition to the moderate-intensity exercise. Maintain a healthy weight  Body mass index (BMI) is a measurement that can be used to identify possible weight problems. It estimates body fat based on height and weight. Your health care provider can help determine your BMI and help you achieve or maintain a healthy weight.  For females 17 years of age and older: ? A BMI below 18.5 is considered underweight. ? A BMI of 18.5 to 24.9 is normal. ? A BMI of 25 to 29.9 is considered overweight. ? A BMI of 30 and above is considered obese. Watch levels of cholesterol and blood lipids  You should start having your blood tested for lipids and cholesterol  at 61 years of age, then have this test every 5 years.  You may need to have your cholesterol levels checked more often if: ? Your lipid or cholesterol levels are high. ? You are older than 61 years of age. ? You are at high risk for heart disease. Cancer screening Lung Cancer  Lung cancer screening is recommended for adults 56-70 years old who are at high risk for lung cancer because of a history of smoking.  A yearly low-dose CT scan of the lungs is recommended for people who: ? Currently smoke. ? Have quit within the past 15 years. ? Have at least a 30-pack-year history of smoking. A pack year is smoking an average of one pack of cigarettes a day for 1 year.  Yearly screening should continue until it has been 15 years since you quit.  Yearly screening should stop if you develop a health problem that would prevent you from having lung cancer treatment. Breast Cancer  Practice breast self-awareness. This means understanding how your breasts normally appear and feel.  It also means doing regular breast self-exams. Let your health care provider know about any changes, no matter how small.  If you are in your 20s or 30s, you should have a clinical breast exam (CBE) by a health care provider every 1-3 years as part of a regular health exam.  If you are 10 or older, have a CBE every year. Also consider having a breast X-ray (mammogram) every year.  If you have a family history of breast cancer, talk to your health care provider about genetic screening.  If you are at high risk for breast cancer, talk to your health care provider about having an MRI and a mammogram every year.  Breast cancer gene (BRCA) assessment is recommended for women who have family members with BRCA-related cancers. BRCA-related cancers include: ? Breast. ? Ovarian. ? Tubal. ? Peritoneal cancers.  Results of the assessment will determine the need for genetic counseling and BRCA1 and BRCA2 testing. Cervical  Cancer Your health care provider may recommend that you be screened regularly for cancer of the pelvic organs (ovaries, uterus, and vagina). This screening involves a pelvic examination, including checking for microscopic changes to the surface of your cervix (Pap test). You may be encouraged to have this screening done every 3 years, beginning at age 39.  For women ages 61-65, health care providers may recommend pelvic exams and Pap testing every 3 years, or they may recommend the Pap and pelvic exam, combined with testing for human papilloma virus (HPV), every 5 years. Some types of HPV increase your risk of cervical cancer. Testing for HPV may also be done on women of any age with unclear Pap test results.  Other health care providers may not recommend any screening for nonpregnant women who are considered low risk for pelvic cancer and who do not have symptoms. Ask your health care provider if a screening pelvic exam is right for you.  If you have had past treatment for cervical cancer or a condition that could lead to cancer, you need Pap tests and screening for cancer for at least 20 years after your treatment. If Pap tests have been discontinued, your risk factors (such as having a new sexual partner) need to be reassessed to determine if screening should resume. Some women have medical problems that increase the chance of getting cervical cancer. In these cases, your health care provider may recommend more frequent screening and Pap tests. Colorectal Cancer  This type of cancer can be detected and often prevented.  Routine colorectal cancer screening usually begins at 61 years of age and continues through 61 years of age.  Your health care provider may recommend screening at an earlier age if you have risk factors for colon cancer.  Your health care provider may also recommend using home test kits to check for hidden blood in the stool.  A small camera at the end of a tube can be used to  examine your colon directly (sigmoidoscopy or colonoscopy). This is done to check for the earliest forms of colorectal cancer.  Routine screening usually begins at age 65.  Direct examination of the colon should be repeated every 5-10 years through 61 years of age. However, you may need to be screened more often if early forms of precancerous polyps or small growths are found. Skin Cancer  Check your skin from head to toe regularly.  Tell your health care provider about any new moles or changes in moles, especially if there is a change in a mole's shape or color.  Also tell your health care provider if you have a mole that is larger than the size of a pencil eraser.  Always use sunscreen. Apply sunscreen liberally and repeatedly throughout the day.  Protect yourself by wearing long sleeves, pants, a wide-brimmed hat, and sunglasses whenever you are outside. Heart disease, diabetes, and high blood pressure  High blood pressure causes heart disease and increases the risk of  stroke. High blood pressure is more likely to develop in: ? People who have blood pressure in the high end of the normal range (130-139/85-89 mm Hg). ? People who are overweight or obese. ? People who are African American.  If you are 29-61 years of age, have your blood pressure checked every 3-5 years. If you are 66 years of age or older, have your blood pressure checked every year. You should have your blood pressure measured twice-once when you are at a hospital or clinic, and once when you are not at a hospital or clinic. Record the average of the two measurements. To check your blood pressure when you are not at a hospital or clinic, you can use: ? An automated blood pressure machine at a pharmacy. ? A home blood pressure monitor.  If you are between 59 years and 67 years old, ask your health care provider if you should take aspirin to prevent strokes.  Have regular diabetes screenings. This involves taking a blood  sample to check your fasting blood sugar level. ? If you are at a normal weight and have a low risk for diabetes, have this test once every three years after 61 years of age. ? If you are overweight and have a high risk for diabetes, consider being tested at a younger age or more often. Preventing infection Hepatitis B  If you have a higher risk for hepatitis B, you should be screened for this virus. You are considered at high risk for hepatitis B if: ? You were born in a country where hepatitis B is common. Ask your health care provider which countries are considered high risk. ? Your parents were born in a high-risk country, and you have not been immunized against hepatitis B (hepatitis B vaccine). ? You have HIV or AIDS. ? You use needles to inject street drugs. ? You live with someone who has hepatitis B. ? You have had sex with someone who has hepatitis B. ? You get hemodialysis treatment. ? You take certain medicines for conditions, including cancer, organ transplantation, and autoimmune conditions. Hepatitis C  Blood testing is recommended for: ? Everyone born from [birth year] through 1963. ? Anyone with known risk factors for hepatitis C. Sexually transmitted infections (STIs)  You should be screened for sexually transmitted infections (STIs) including gonorrhea and chlamydia if: ? You are sexually active and are younger than 61 years of age. ? You are older than 61 years of age and your health care provider tells you that you are at risk for this type of infection. ? Your sexual activity has changed since you were last screened and you are at an increased risk for chlamydia or gonorrhea. Ask your health care provider if you are at risk.  If you do not have HIV, but are at risk, it may be recommended that you take a prescription medicine daily to prevent HIV infection. This is called pre-exposure prophylaxis (PrEP). You are considered at risk if: ? You are sexually active and do not  regularly use condoms or know the HIV status of your partner(s). ? You take drugs by injection. ? You are sexually active with a partner who has HIV. Talk with your health care provider about whether you are at high risk of being infected with HIV. If you choose to begin PrEP, you should first be tested for HIV. You should then be tested every 3 months for as long as you are taking PrEP. Pregnancy  If you are premenopausal  and you may become pregnant, ask your health care provider about preconception counseling.  If you may become pregnant, take 400 to 800 micrograms (mcg) of folic acid every day.  If you want to prevent pregnancy, talk to your health care provider about birth control (contraception). Osteoporosis and menopause  Osteoporosis is a disease in which the bones lose minerals and strength with aging. This can result in serious bone fractures. Your risk for osteoporosis can be identified using a bone density scan.  If you are 68 years of age or older, or if you are at risk for osteoporosis and fractures, ask your health care provider if you should be screened.  Ask your health care provider whether you should take a calcium or vitamin D supplement to lower your risk for osteoporosis.  Menopause may have certain physical symptoms and risks.  Hormone replacement therapy may reduce some of these symptoms and risks. Talk to your health care provider about whether hormone replacement therapy is right for you. Follow these instructions at home:  Schedule regular health, dental, and eye exams.  Stay current with your immunizations.  Do not use any tobacco products including cigarettes, chewing tobacco, or electronic cigarettes.  If you are pregnant, do not drink alcohol.  If you are breastfeeding, limit how much and how often you drink alcohol.  Limit alcohol intake to no more than 1 drink per day for nonpregnant women. One drink equals 12 ounces of beer, 5 ounces of wine, or 1  ounces of hard liquor.  Do not use street drugs.  Do not share needles.  Ask your health care provider for help if you need support or information about quitting drugs.  Tell your health care provider if you often feel depressed.  Tell your health care provider if you have ever been abused or do not feel safe at home. This information is not intended to replace advice given to you by your health care provider. Make sure you discuss any questions you have with your health care provider. Document Released: 08/15/2010 Document Revised: 07/08/2015 Document Reviewed: 11/03/2014 Elsevier Interactive Patient Education  2019 [employer].

## 2018-08-01 LAB — T3: T3, Total: 127 ng/dL (ref 76–181)

## 2018-08-02 ENCOUNTER — Telehealth: Payer: Self-pay

## 2018-08-02 NOTE — Telephone Encounter (Signed)
My Chart message sent

## 2018-08-06 LAB — CYTOLOGY - PAP
Diagnosis: NEGATIVE
HPV: NOT DETECTED

## 2018-08-07 ENCOUNTER — Telehealth: Payer: Self-pay

## 2018-08-07 NOTE — Telephone Encounter (Signed)
MyChart message sent with results.  

## 2018-08-14 ENCOUNTER — Encounter: Payer: Self-pay | Admitting: Family Medicine

## 2018-08-14 ENCOUNTER — Other Ambulatory Visit: Payer: Self-pay

## 2018-08-14 ENCOUNTER — Ambulatory Visit: Payer: BC Managed Care – PPO | Admitting: Family Medicine

## 2018-08-14 VITALS — BP 135/83 | HR 73 | Temp 97.9°F | Resp 16 | Ht 66.0 in | Wt 187.0 lb

## 2018-08-14 DIAGNOSIS — I10 Essential (primary) hypertension: Secondary | ICD-10-CM

## 2018-08-14 LAB — BASIC METABOLIC PANEL
BUN: 18 mg/dL (ref 6–23)
CO2: 32 mEq/L (ref 19–32)
Calcium: 9.2 mg/dL (ref 8.4–10.5)
Chloride: 102 mEq/L (ref 96–112)
Creatinine, Ser: 0.65 mg/dL (ref 0.40–1.20)
GFR: 92.71 mL/min (ref >60.00)
Glucose, Bld: 101 mg/dL — ABNORMAL HIGH (ref 70–99)
Potassium: 4.2 mEq/L (ref 3.5–5.1)
Sodium: 141 mEq/L (ref 135–145)

## 2018-08-14 MED ORDER — HYDROCHLOROTHIAZIDE 25 MG PO TABS: 25.0000 mg | ORAL_TABLET | Freq: Every day | ORAL | 1 refills | Status: DC

## 2018-08-14 NOTE — Progress Notes (Signed)
OFFICE VISIT  08/14/2018   CC:  Chief Complaint  Patient presents with  . Follow-up    hypertension   HPI:    Patient is a 63 y.o. Caucasian female who presents for 2 week f/u HTN. Started hctz 25mg  qd 2 wks ago. Asked her to monitor bp and hr at home and we planned to review them today. Of note, all HP labs last visit NORMAL.  Interim hx: Tolerating med fine. Avg bp 130/80.  Highest systolic 643. For the most part bp's ins 120s/70s.  HR avg 70s.  Feeling well.  Trying to work harder on dieting and exercising but motivation is an issue.  Past Medical History:  Diagnosis Date  . Bleeding internal hemorrhoids 2016  . Borderline hypercholesterolemia 2019   10 yr Framingham cv risk = 4.5%.  TLC recommended.  . Constipation   . GERD (gastroesophageal reflux disease)   . History of adenomatous polyp of colon 04/2014   x 1: recall 5 yrs ([facility])  . Infectious mononucleosis hepatitis college  . Multiple thyroid nodules 2010 approx; 2015   Euthyroid.  Saw ENDO (Dr. Mare Ferrari).  Repeat thyroid u/s 12/2013 showed thyromegaly with multiple nodules meeting biopsy criteria--sent pt to Dr. Cruzita Lederer and plan for repeat u/s (09/2014) and one nodule had grown so bx -->benign.  Labs 03/2014 showed elevted TPO ab, supportive of Hashimoto's thyroiditis--a reassuring finding.  Pt euthyroid.  Pt needs routine f/u with Dr. Cruzita Lederer.  . Osteoarthritis    knees>hips.  Takes no meds.  . Osteopenia 2017; 2019   Stable DEXA results 2017-2019.  Marland Kitchen Postmenopausal status 04/2010   FSH and LH elevated, estrogen low.  . Recurrent UTI     Past Surgical History:  Procedure Laterality Date  . BIOPSY THYROID  05/11/49   Scant follicular epithelium: benign  . Cervical cancer screening  03/11/13   No hx of abnormals.  Neg pap and Neg HR HPV testing: next pap should be after 03/11/2018 and should include HR HPV testing as well.  . COLONOSCOPY W/ POLYPECTOMY  05/2011; 04/2014   IH and 'tics ([facility] Quan [facility]).  Polyp 04/2014= tubular adenoma (recall 5 yrs)    . DEXA  05/2015; 07/2017   T score -2.1: osteopenia, fracture risk not high enough to recommend anything other than calcium and vit D.  June 2019: T-score -2.1.  . ENDOMETRIAL BIOPSY  2006   Performed b/c of abnormal uterine bleeding: normal (fragmented INACTIVE endometrium).  [place] [place].  Marland Kitchen HEMORRHOID BANDING  April and May 2016   [facility]  . LASIK  2002    Outpatient Medications Prior to Visit  Medication Sig Dispense Refill  . Calcium Carbonate (CALCIUM 600 PO) Take 600 mg by mouth daily. Take 1-2  tablets once daily.    . cholecalciferol (VITAMIN D) 1000 UNITS tablet Take 5,000 Units by mouth daily.     . Ciclopirox 1 % shampoo     . loratadine (CLARITIN) 10 MG tablet Take 10 mg by mouth daily.    . Multiple Vitamins-Minerals (PRESERVISION AREDS 2+MULTI VIT PO)     . pantoprazole (PROTONIX) 40 MG tablet TAKE 1 TABLET BY MOUTH DAILY 90 tablet 3  . vitamin B-12 (CYANOCOBALAMIN) 250 MCG tablet Take 250 mcg by mouth daily.    Marland Kitchen zolpidem (AMBIEN) 5 MG tablet TAKE 1 TABLET BY MOUTH EVERY NIGHT AT BEDTIME AS NEEDED FOR SLEEP 90 tablet 1  . hydrochlorothiazide (HYDRODIURIL) 25 MG tablet Take  1 tablet (25 mg total) by mouth daily. 30 tablet 0  . Probiotic Product (PROBIOTIC DAILY PO) Take 1 capsule by mouth daily.     No facility-administered medications prior to visit.     Allergies  Allergen Reactions  . Penicillins Rash    ROS As per HPI  PE: Blood pressure 135/83, pulse 73, temperature 97.9 F (36.6 C), temperature source Temporal, resp. rate 16, height 5\' 6"  (1.676 m), weight 187 lb (84.8 kg), last menstrual period 02/13/2010, SpO2 95 %. Gen: Alert, well appearing.  Patient is oriented to person, place, time, and situation. AFFECT: pleasant, lucid thought and speech. CV: RRR, no m/r/g.   LUNGS: CTA bilat, nonlabored resps, good aeration in all lung fields. EXT: no  clubbing or cyanosis.  no edema.    LABS:    Chemistry      Component Value Date/Time   NA 141 07/31/2018 1043   K 4.1 07/31/2018 1043   CL 103 07/31/2018 1043   CO2 31 07/31/2018 1043   BUN 14 07/31/2018 1043   CREATININE 0.60 07/31/2018 1043   CREATININE 0.60 10/24/2012 1613      Component Value Date/Time   CALCIUM 9.6 07/31/2018 1043   ALKPHOS 87 07/31/2018 1043   AST 12 07/31/2018 1043   ALT 17 07/31/2018 1043   BILITOT 0.4 07/31/2018 1043      IMPRESSION AND PLAN:  1) Hypertension: under good control now on 25mg  hctz qd. BMET today. Discussed some general dietary/exercise/lifestyle modification specifics that she should try.  An After Visit Summary was printed and given to the patient.  FOLLOW UP: Return in about 6 months (around 02/14/2019) for routine chronic illness f/u.  Signed:  Santiago BumpersPhil Vernessa Likes, MD           08/14/2018

## 2018-08-16 ENCOUNTER — Encounter: Payer: Self-pay | Admitting: Family Medicine

## 2018-09-30 ENCOUNTER — Telehealth: Payer: Self-pay

## 2018-09-30 NOTE — Telephone Encounter (Signed)
Pt was contacted and appt cancelled for tomorrow. Next follow up appt scheduled.

## 2018-09-30 NOTE — Telephone Encounter (Signed)
Pt is on the schedule for tomorrow but just had appt 08/14/18, hypertension. Med was added to help decrease and advised to f/u 6 months.  Okay to cancel appt for tomorrow?

## 2018-09-30 NOTE — Telephone Encounter (Signed)
Yes, ok to cancel appt for tomorrow.

## 2018-10-01 ENCOUNTER — Ambulatory Visit: Payer: BC Managed Care – PPO | Admitting: Family Medicine

## 2018-10-02 ENCOUNTER — Other Ambulatory Visit: Payer: Self-pay

## 2018-10-02 ENCOUNTER — Telehealth: Payer: Self-pay

## 2018-10-02 DIAGNOSIS — E042 Nontoxic multinodular goiter: Secondary | ICD-10-CM

## 2018-10-02 DIAGNOSIS — E063 Autoimmune thyroiditis: Secondary | ICD-10-CM

## 2018-10-02 NOTE — Telephone Encounter (Signed)
Yes  thx

## 2018-10-02 NOTE — Telephone Encounter (Signed)
New referral entered 

## 2018-10-02 NOTE — Telephone Encounter (Signed)
Pt called and stated since it had been awhile she has seen Dr. Cruzita Lederer, a new referral was needed to schedule appt.  Okay for new referral?

## 2018-10-28 ENCOUNTER — Other Ambulatory Visit: Payer: Self-pay

## 2018-10-30 ENCOUNTER — Other Ambulatory Visit: Payer: Self-pay

## 2018-10-30 ENCOUNTER — Encounter: Payer: Self-pay | Admitting: Internal Medicine

## 2018-10-30 ENCOUNTER — Ambulatory Visit: Payer: BC Managed Care – PPO | Admitting: Internal Medicine

## 2018-10-30 VITALS — BP 140/82 | HR 82 | Ht 66.0 in | Wt 187.6 lb

## 2018-10-30 DIAGNOSIS — E063 Autoimmune thyroiditis: Secondary | ICD-10-CM

## 2018-10-30 DIAGNOSIS — E042 Nontoxic multinodular goiter: Secondary | ICD-10-CM | POA: Diagnosis not present

## 2018-10-30 NOTE — Progress Notes (Addendum)
Patient ID: Andrea Newton, female   DOB: 03-01-1955, 63 y.o.   MRN: [id number]   HPI  Andrea Newton is a 63 y.o.-year-old female, initially referred by her PCP, Dr. Milinda Cave, for evaluation for MNG.  I saw the patient last more than 4 years ago for this problem but now she is referred back for Hashimoto's thyroiditis, which were actually diagnosed at last visit.  Reviewed and addended history: She was dx with MNG in 2010 >> saw Dr Jimmye Norman at [facility] >> followed for them but no FNA.   Thyroid U/S (01/13/2014) - 4 nodules: Right thyroid lobe  Measurements: 59 x 20 x 23 mm. Nodule inhomogeneous echotexture. Background parenchyma is type hyperemic. Largest lesion 18 x 10 x 15 mm, superior pole solid.  There is an 18 x 12 x 13 mm solid lesion towards the lower pole.   Left thyroid lobe  Measurements: 63 x 21 x 19 mm. Inhomogeneous nodular background parenchyma.  There is a dominant 18 x 11 x 9 mm nodule, inferiorpole.  There is a discrete 12 x 15 x 11 mm nodule in the superiorpole.  Isthmus Thickness: 4 mm. No nodules visualized.  Lymphadenopathy None visualized.  Thyroid U/S (10/13/2014):  Right thyroid lobe  Measurements: 52 x 19 x 21 mm. Mildly hyperemic and inhomogeneous echotexture with multiple small nodules.  Largest 12 x 6 x 9 mm hypoechoic solid, mid lobe (previously 18 x 12 x 13).  A superiorpole lesion described previously is less conspicuous as a discrete nodule.  Remainder of visible nodules less than 7 mm.  Left thyroid lobe  Measurements: 62 x 23 x 21 mm. Multiple nodules.  14 x 12 x 10 mm solid, inferior pole (previously 18 x 11 x 9).  18 x 11 x 11 mm solid, superior pole (previously 12 x 15 x 11).  Remainder less than 6 mm.  Isthmus Thickness: 4 mm.  No nodules visualized.  Lymphadenopathy None visualized.  IMPRESSION: 1. Thyromegaly with multiple bilateral nodules. The superior left lesion has slightly increased since prior study, and  meets consensus criteria for biopsy.   Biopsy of the dominant nodule (superior left) (11/05/2014) (performed because the nodule increased in size): Inconclusive: Scant follicular epithelial crescents-Bethesda category 1. She had a traumatic experience >> bruising.  Reviewed patient's TFTs Lab Results  Component Value Date   TSH 3.47 07/31/2018   TSH 4.34 07/23/2017   TSH 3.16 05/14/2015   TSH 2.47 03/12/2014   TSH 3.41 12/31/2013   TSH 3.644 10/24/2012   FREET4 0.77 07/31/2018   FREET4 0.86 03/12/2014   FREET4 1.08 10/24/2012    Lab Results  Component Value Date   T3FREE 3.0 03/12/2014   Her TPO antibodies were elevated: Component     Latest Ref Rng & Units 03/12/2014  Thyroperoxidase Ab SerPl-aCnc     <9 IU/mL 61 (H)   Pt denies: - feeling nodules in neck - hoarseness - dysphagia - choking - SOB with lying down  Pt does have a FH of thyroid ds.: hypothyroidism in mother. No FH of thyroid cancer. No h/o radiation tx to head or neck.  No herbal supplements. No Biotin use. No recent steroids use.   She also has a history of vitamin D deficiency.  Latest vitamin D levels have been normal: Lab Results  Component Value Date   VD25OH 42.31 05/14/2015   VD25OH 69.63 01/02/2014   VD25OH 43 10/24/2012   She was started on BP medicines this year: HCTZ.  ROS: Constitutional: + weight  gain/no weight loss, no fatigue, no subjective hyperthermia, no subjective hypothermia, + poor sleep Eyes: no blurry vision, no xerophthalmia ENT: no sore throat, + see HPI Cardiovascular: no CP/no SOB/no palpitations/no leg swelling Respiratory: no cough/no SOB/no wheezing Gastrointestinal: no N/no V/no D/no C/+ acid reflux Musculoskeletal: no muscle aches/+ joint aches Skin: no rashes, no hair loss Neurological: no tremors/no numbness/no tingling/no dizziness  I reviewed pt's medications, allergies, PMH, social hx, family hx, and changes were documented in the history of present illness.  Otherwise, unchanged from my initial visit note.  Past Medical History:  Diagnosis Date  . Bleeding internal hemorrhoids 2016  . Borderline hypercholesterolemia 2019   10 yr Framingham cv risk = 4.5%.  TLC recommended.  . Constipation   . GERD (gastroesophageal reflux disease)   . History of adenomatous polyp of colon 04/2014   x 1: recall 5 yrs ([facility])  . Infectious mononucleosis hepatitis [employer]  . Multiple thyroid nodules 2010 approx; 2015   Euthyroid.  Saw ENDO (Dr. Jimmye NormanFarmer).  Repeat thyroid u/s 12/2013 showed thyromegaly with multiple nodules meeting biopsy criteria--sent pt to Dr. Elvera LennoxGherghe and plan for repeat u/s (09/2014) and one nodule had grown so bx -->benign.  Labs 03/2014 showed elevted TPO ab, supportive of Hashimoto's thyroiditis--a reassuring finding.  Pt euthyroid.  Pt needs routine f/u with Dr. Elvera LennoxGherghe.  . Osteoarthritis    knees>hips.  Takes no meds.  . Osteopenia 2017; 2019   Stable DEXA results 2017-2019.  Marland Kitchen. Postmenopausal status 04/2010   FSH and LH elevated, estrogen low.  . Recurrent UTI    Past Surgical History:  Procedure Laterality Date  . BIOPSY THYROID  11/05/14   Scant follicular epithelium: benign  . Cervical cancer screening  03/11/13   No hx of abnormals.  Neg pap and Neg HR HPV testing: next pap should be after 03/11/2018 and should include HR HPV testing as well.  . COLONOSCOPY W/ POLYPECTOMY  05/2011; 04/2014   IH and 'tics (Dr. Rhetta MuraSears w/ [facility]).  Polyp 04/2014= tubular adenoma (recall 5 yrs)    . DEXA  05/2015; 07/2017   T score -2.1: osteopenia, fracture risk not high enough to recommend anything other than calcium and vit D.  June 2019: T-score -2.1.  . ENDOMETRIAL BIOPSY  2006   Performed b/c of abnormal uterine bleeding: normal (fragmented INACTIVE endometrium).  [facility], [place].  Marland Kitchen. HEMORRHOID BANDING  April and May 2016   [facility]  . LASIK  2002   History   Social History  . Marital  Status: Married    Spouse Name: N/A    Number of Children: 2   Occupational History  . Retired Runner, broadcasting/film/videoteacher   Social History Main Topics  . Smoking status: Never Smoker   . Smokeless tobacco: Never Used  . Alcohol Use: Yes, wine, 2x weekly  . 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   No tobacco, rare alcohol, no drugs.  No siblings.   No exercise but "active".   Current Outpatient Medications on File Prior to Visit  Medication Sig Dispense Refill  . Calcium Carbonate (CALCIUM 600 PO) Take 600 mg by mouth daily. Take 1-2  tablets once daily.    . cholecalciferol (VITAMIN D) 1000 UNITS tablet Take 5,000 Units by mouth daily.     .Marland Kitchen  Ciclopirox 1 % shampoo     . hydrochlorothiazide (HYDRODIURIL) 25 MG tablet Take 1 tablet (25 mg total) by mouth daily. 90 tablet 1  . loratadine (CLARITIN) 10 MG tablet Take 10 mg by mouth daily.    . Multiple Vitamins-Minerals (PRESERVISION AREDS 2+MULTI VIT PO)     . pantoprazole (PROTONIX) 40 MG tablet TAKE 1 TABLET BY MOUTH DAILY 90 tablet 3  . vitamin B-12 (CYANOCOBALAMIN) 250 MCG tablet Take 250 mcg by mouth daily.    Marland Kitchen. zolpidem (AMBIEN) 5 MG tablet TAKE 1 TABLET BY MOUTH EVERY NIGHT AT BEDTIME AS NEEDED FOR SLEEP 90 tablet 1   No current facility-administered medications on file prior to visit.    Allergies  Allergen Reactions  . Penicillins Rash   Family History  Problem Relation Age of Onset  . Arthritis Mother        Osteo (bilat hip replacements)  . Cancer Maternal Grandmother        ovarian  . Heart disease Maternal Grandmother   . Stroke Maternal Grandmother   Father - pancreatic cancer.  Mother with heart disease and thyroid disease.  PE: BP 140/82 (BP Location: Left Arm, Patient Position: Sitting, Cuff Size: Normal)   Pulse 82   Ht 5\' 6"  (1.676 m)   Wt 187 lb 9.6  oz (85.1 kg)   LMP 02/13/2010   SpO2 98%   BMI 30.28 kg/m  Wt Readings from Last 3 Encounters:  10/30/18 187 lb 9.6 oz (85.1 kg)  08/14/18 187 lb (84.8 kg)  07/31/18 186 lb 12.8 oz (84.7 kg)   Constitutional: overweight, in NAD Eyes: PERRLA, EOMI, no exophthalmos ENT: moist mucous membranes, + mild symmetric thyromegaly; lumpy bumpy thyroid on palpation, no cervical lymphadenopathy Cardiovascular: RRR, No MRG Respiratory: CTA B Gastrointestinal: abdomen soft, NT, ND, BS+ Musculoskeletal: no deformities, strength intact in all 4 Skin: moist, warm, no rashes Neurological: no tremor with outstretched hands, DTR normal in all 4  ASSESSMENT: 1. MNG - 4 nodules: R - 2x 1.8 cm; L - 1x 1.8 cm; 1x 1.5 cm - Dominant nodule biopsy: Bethesda categ. 1 (scant material)  2.  Hashimoto's thyroiditis  PLAN: 1. MNG  -Of note, she does have Hashimoto's thyroiditis which can cause thyroid inflammation with a pseudo-nodular appearance. - I reviewed the reports of her previous thyroid ultrasounds along with the patient.  The dominant nodules are not very large, they are isoechoic to slightly hypoechoic, without calcifications, and well delimited from the surrounding tissue.  The dominant nodule has been biopsied, however, the sample was not sufficient so the report was inconclusive.  We discussed about repeating the ultrasound now and, depending on the appearance of the nodules, we may need to repeat a biopsy. -No neck compression symptoms -She does not have a family history of thyroid cancer or personal history of radiation therapy to head or neck, which puts her at low risk for thyroid cancer -Depending on the results of the ultrasound +/- biopsy, we may be able to only continue to follow her clinically and with thyroid ultrasound and possibly without a new biopsy  2. Hashimoto's thyroiditis -We checked her thyroid antibodies at last visit and these were elevated, giving her a diagnosis of  Hashimoto's thyroiditis -Patient is euthyroid and w/o complaints other than wt gain -At this visit, we will repeat her TFTs we discussed about the possibility of starting levothyroxine. - We discussed about subclinical hypothyroidism in general and also about indications for treatment:  Desire for pregnancy  TSH higher than 10  TSH lower than 10 with signs or symptoms consistent with hypothyroidism -she had labs by PCP recently and they were normal >> she can continue to have this checked at her annual physical exams -I will see her back in 2 years  Orders Placed This Encounter  Procedures  . US THYROID   CLINICAL DATA:  Goiter. 63 year old female with a history of multiple bilateral thyroid nodules. Patient reportedly underwent biopsy of the left superior thyroid nodule in 2016.  EXAM: THYROID ULTRASOUND  TECHNIQUE: Ultrasound examination of the thyroid gland and adjacent soft tissues was performed.  COMPARISON:  Most recent prior thyroid ultrasound 10/13/2014 and 01/13/2014  FINDINGS: Parenchymal Echotexture: Mildly heterogenous Isthmus: 0.5 cm Right lobe: 5.3 x 1.8 x 2.4 cm Left lobe: 6.0 x 2.1 x 2.0 cm _____________________________________________________  Estimated total number of nodules >/= 1 cm: 5 ___________________________________________________  Nodule # 2: Prior biopsy: No Location: Right; Mid Maximum size: 1.7 cm; Other 2 dimensions: 1.2 x 0.8 cm, previously, 1.2 x 0.9 x 0.6 cm Composition: solid/almost completely solid (2) Echogenicity: isoechoic (1) *Given size (>/= 1.5 - 2.4 cm) and appearance, a follow-up ultrasound in 1 year should be considered based on TI-RADS criteria. _________________________________________________________  Nodule # 5: The reportedly previously biopsied nodule in the left upper gland measures unchanged at 1.8 x 1.1 x 1.0 cm. No significant interval  change. ________________________________________________________  Nodule # 6: Small isoechoic solid nodule measuring less than 1.5 cm does not meet criteria for further evaluation. _________________________________________________________  Nodule # 8: Prior biopsy: No Location: Left; Inferior Maximum size: 1.7 cm; Other 2 dimensions: 1.4 x 1.3 cm, previously, 1.4 x 1.2 x 1.0 cm Composition: solid/almost completely solid (2) Echogenicity: isoechoic (1) *Given size (>/= 1.5 - 2.4 cm) and appearance, a follow-up ultrasound in 1 year should be considered based on TI-RADS criteria. _________________________________________________________  IMPRESSION: 1. Diffusely enlarged, heterogeneous and multinodular thyroid gland most consistent with multinodular goiter. 2. Slight interval growth of right mid and left inferior TI-RADS category 3 nodules (labeled # 2 and # 8 above) compared to prior imaging from August of 2016. Minimal enlargement over 4 years is of uncertain clinical significance. At the current size and imaging characteristics, neither nodule meets criteria for biopsy. Consider continued surveillance. 3. The previously biopsied nodule in the left superior gland demonstrates no significant interval change. 4. Numerous additional bilateral thyroid nodules which do not meet criteria for biopsy or further imaging surveillance.  The above is in keeping with the ACR TI-RADS recommendations - J Am Coll Radiol 2017;14:587-595.  We will continue to follow her clinically and repeat the ultrasound in another year.  Electronically Signed   By: Jacqulynn Cadet M.D.   On: 11/19/2018 09:48   Philemon Kingdom, MD PhD [facility] Endocrinology

## 2018-10-30 NOTE — Patient Instructions (Signed)
We are going to obtain a thyroid ultrasound. Please let me know if you are not called about this within 1 week.  Please come back for a follow-up appointment in 2 years.

## 2018-11-11 ENCOUNTER — Other Ambulatory Visit: Payer: Self-pay

## 2018-11-11 ENCOUNTER — Ambulatory Visit (INDEPENDENT_AMBULATORY_CARE_PROVIDER_SITE_OTHER): Payer: BC Managed Care – PPO

## 2018-11-11 DIAGNOSIS — Z23 Encounter for immunization: Secondary | ICD-10-CM | POA: Diagnosis not present

## 2018-11-15 ENCOUNTER — Ambulatory Visit
Admission: RE | Admit: 2018-11-15 | Discharge: 2018-11-15 | Disposition: A | Discharge status: Home / Self Care | Payer: BC Managed Care – PPO | Source: Ambulatory Visit | Attending: Internal Medicine | Admitting: Internal Medicine

## 2018-12-27 ENCOUNTER — Encounter: Payer: Self-pay | Admitting: Family Medicine

## 2019-02-18 ENCOUNTER — Ambulatory Visit: Payer: BC Managed Care – PPO | Admitting: Family Medicine

## 2019-02-24 ENCOUNTER — Encounter: Payer: Self-pay | Admitting: Family Medicine

## 2019-02-24 ENCOUNTER — Ambulatory Visit (INDEPENDENT_AMBULATORY_CARE_PROVIDER_SITE_OTHER): Payer: BC Managed Care – PPO | Admitting: Family Medicine

## 2019-02-24 ENCOUNTER — Other Ambulatory Visit: Payer: Self-pay

## 2019-02-24 VITALS — BP 133/77 | HR 78

## 2019-02-24 DIAGNOSIS — E785 Hyperlipidemia, unspecified: Secondary | ICD-10-CM | POA: Diagnosis not present

## 2019-02-24 DIAGNOSIS — I10 Essential (primary) hypertension: Secondary | ICD-10-CM

## 2019-02-24 DIAGNOSIS — G4700 Insomnia, unspecified: Secondary | ICD-10-CM

## 2019-02-24 MED ORDER — HYDROCHLOROTHIAZIDE 25 MG PO TABS: 25.0000 mg | ORAL_TABLET | Freq: Every day | ORAL | 3 refills | Status: DC

## 2019-02-24 NOTE — Progress Notes (Signed)
Virtual Visit via Video Note  I connected with pt on 02/24/19 at 10:00 AM EST by a video enabled telemedicine application and verified that I am speaking with the correct person using two identifiers.  Location patient: home Location provider:work or home office Persons participating in the virtual visit: patient, provider  I discussed the limitations of evaluation and management by telemedicine and the availability of in person appointments. The patient expressed understanding and agreed to proceed.  Telemedicine visit is a necessity given the COVID-19 restrictions in place at the current time.  HPI: 64 y/o WF being seen today for 6 mo f/u HTN, borderline HLD, insomnia. Feeling well. No covid issues.  HTN: occ bp checks at home some 130s, 70s diast. Occ systolic >140s, day before oral surgery. Has gained a few pounds. Trying to work on low Na intake.    HLD: borderline, not on meds, cutting portion size but o/w not changing nutrients. Walking more.   Insomnia: still requiring this some, esp with travel/being out of town.  No side effects.  PMP AWARE reviewed today: most recent rx for zolpidem 5mg  was filled 04/27/18, # 30, rx by me.. No red flags.  ROS: See pertinent positives and negatives per HPI.  Past Medical History:  Diagnosis Date  . Bleeding internal hemorrhoids 2016  . Borderline hypercholesterolemia 2019   10 yr Framingham cv risk = 4.5%.  TLC recommended.  . Constipation   . GERD (gastroesophageal reflux disease)   . History of adenomatous polyp of colon 04/2014   x 1: recall 5 yrs ([facility])  . Infectious mononucleosis hepatitis college  . Multiple thyroid nodules 2010 approx; 2015   Euthyroid.  Saw ENDO (Dr. 05/2014).  Repeat thyroid u/s 12/2013 showed thyromegaly with multiple nodules meeting biopsy criteria--sent pt to Dr. 01/2014 and plan for repeat u/s (09/2014) and one nodule had grown so bx -->benign.  Labs 03/2014 showed elevted TPO ab, supportive of  Hashimoto's thyroiditis--a reassuring finding.  Pt euthyroid. 11/2018 u/s->stable. Annual TFT's with me, 2 yr f/u Dr. 12/2018  . Osteoarthritis    knees>hips.  Takes no meds.  . Osteopenia 2017; 2019   Stable DEXA results 2017-2019.  01-23-1967 Postmenopausal status 04/2010   FSH and LH elevated, estrogen low.  . Recurrent UTI     Past Surgical History:  Procedure Laterality Date  . BIOPSY THYROID  11/05/14   Scant follicular epithelium: benign  . Cervical cancer screening  03/11/13   No hx of abnormals.  Neg pap and Neg HR HPV testing: next pap should be after 03/11/2018 and should include HR HPV testing as well.  . COLONOSCOPY W/ POLYPECTOMY  05/2011; 04/2014   IH and 'tics (Dr. 05/2014 w/ [facility]).  Polyp 04/2014= tubular adenoma (recall 5 yrs)    . DEXA  05/2015; 07/2017   T score -2.1: osteopenia, fracture risk not high enough to recommend anything other than calcium and vit D.  June 2019: T-score -2.1.  . ENDOMETRIAL BIOPSY  2006   Performed b/c of abnormal uterine bleeding: normal (fragmented INACTIVE endometrium).  [facility], .  2007 HEMORRHOID BANDING  April and May 2016   [facility]  . LASIK  2002    Family History  Problem Relation Age of Onset  . Arthritis Mother        Osteo (bilat hip replacements)  . Cancer Maternal Grandmother        ovarian  . Heart disease Maternal Grandmother   . Stroke  Maternal Grandmother     SOCIAL HX:  Social History   Socioeconomic History  . Marital status: Married    Spouse name: Not on file  . Number of children: Not on file  . Years of education: Not on file  . Highest education level: Not on file  Occupational History  . Not on file  Tobacco Use  . Smoking status: Never Smoker  . Smokeless tobacco: Never Used  Substance and Sexual Activity  . Alcohol use: Yes    Alcohol/week: 1.0 standard drinks    Types: 1 Glasses of wine per week    Comment: socially  . Drug use: No  . Sexual activity:  Not on file  Other Topics Concern  . Not on file  Social History Narrative   Married, 2 children (son Stenberg in his 69s, daughter age 37 at [place]).   Occupation: [employer] at [employer].   Native [place], still lives on her family's farm in [place], [place].   No tobacco, rare alcohol, no drugs.  No siblings.   No exercise but "active".   Social Determinants of Health   Financial Resource Strain:   . Difficulty of Paying Living Expenses: Not on file  Food Insecurity:   . Worried About Charity fundraiser in the Last Year: Not on file  . Ran Out of Food in the Last Year: Not on file  Transportation Needs:   . Lack of Transportation (Medical): Not on file  . Lack of Transportation (Non-Medical): Not on file  Physical Activity:   . Days of Exercise per Week: Not on file  . Minutes of Exercise per Session: Not on file  Stress:   . Feeling of Stress : Not on file  Social Connections:   . Frequency of Communication with Friends and Family: Not on file  . Frequency of Social Gatherings with Friends and Family: Not on file  . Attends Religious Services: Not on file  . Active Member of Clubs or Organizations: Not on file  . Attends Archivist Meetings: Not on file  . Marital Status: Not on file      Current Outpatient Medications:  .  Calcium Carbonate (CALCIUM 600 PO), Take 600 mg by mouth daily. Take 1-2  tablets once daily., Disp: , Rfl:  .  cholecalciferol (VITAMIN D) 1000 UNITS tablet, Take 5,000 Units by mouth daily. , Disp: , Rfl:  .  Ciclopirox 1 % shampoo, , Disp: , Rfl:  .  ELIDEL 1 % cream, as needed., Disp: , Rfl:  .  hydrochlorothiazide (HYDRODIURIL) 25 MG tablet, Take 1 tablet (25 mg total) by mouth daily., Disp: 90 tablet, Rfl: 1 .  loratadine (CLARITIN) 10 MG tablet, Take 10 mg by mouth daily., Disp: , Rfl:  .  Multiple Vitamins-Minerals (PRESERVISION AREDS 2+MULTI VIT PO), , Disp: , Rfl:  .  pantoprazole (PROTONIX) 40 MG  tablet, TAKE 1 TABLET BY MOUTH DAILY, Disp: 90 tablet, Rfl: 3 .  saccharomyces boulardii (FLORASTOR) 250 MG capsule, Take by mouth daily., Disp: , Rfl:  .  zolpidem (AMBIEN) 5 MG tablet, TAKE 1 TABLET BY MOUTH EVERY NIGHT AT BEDTIME AS NEEDED FOR SLEEP, Disp: 90 tablet, Rfl: 1  EXAM:  VITALS per patient if applicable: BP 409/81 (BP Location: Left Arm, Patient Position: Sitting, Cuff Size: Normal)   Pulse 78   LMP 02/13/2010    GENERAL: alert, oriented, appears well and in no acute distress  HEENT: atraumatic, conjunttiva clear, no  obvious abnormalities on inspection of external nose and ears  NECK: normal movements of the head and neck  LUNGS: on inspection no signs of respiratory distress, breathing rate appears normal, no obvious gross SOB, gasping or wheezing  CV: no obvious cyanosis  MS: moves all visible extremities without noticeable abnormality  PSYCH/NEURO: pleasant and cooperative, no obvious depression or anxiety, speech and thought processing grossly intact  LABS: none today  Lab Results  Component Value Date   TSH 3.47 07/31/2018   Lab Results  Component Value Date   WBC 5.2 07/31/2018   HGB 13.2 07/31/2018   HCT 39.5 07/31/2018   MCV 91.6 07/31/2018   PLT 235.0 07/31/2018   Lab Results  Component Value Date   CREATININE 0.65 08/14/2018   BUN 18 08/14/2018   NA 141 08/14/2018   K 4.2 08/14/2018   CL 102 08/14/2018   CO2 32 08/14/2018   Lab Results  Component Value Date   ALT 17 07/31/2018   AST 12 07/31/2018   ALKPHOS 87 07/31/2018   BILITOT 0.4 07/31/2018   Lab Results  Component Value Date   CHOL 222 (H) 07/31/2018   Lab Results  Component Value Date   HDL 48.70 07/31/2018   Lab Results  Component Value Date   LDLCALC 148 (H) 07/31/2018   Lab Results  Component Value Date   TRIG 123.0 07/31/2018   Lab Results  Component Value Date   CHOLHDL 5 07/31/2018   Lab Results  Component Value Date   HGBA1C 5.6 01/02/2014     ASSESSMENT AND PLAN:  Discussed the following assessment and plan:  1) HTN: The current medical regimen is effective;  continue present plan and medications. Some borderline readings but we'll leave things alone right now. She'll continue working at [employer], low Na, etc. Recheck BMET 6 mo.  2) HLD: not on meds, as her 10 yr CV risk is not > 7.5%.  She read Dr. Darnelle Maffucci book!. Discussed lower carb, lower fats, inc exercise. Recheck labs 6 mo.  3) Insomnia: uses zolpidem sparingly and this has been VERY helpful.  -we discussed possible serious and likely etiologies, options for evaluation and workup, limitations of telemedicine visit vs in person visit, treatment, treatment risks and precautions. Pt prefers to treat via telemedicine empirically rather then risking or undertaking an in person visit at this moment. Patient agrees to seek prompt in person care if worsening, new symptoms arise, or if is not improving with treatment.   I discussed the assessment and treatment plan with the patient. The patient was provided an opportunity to ask questions and all were answered. The patient agreed with the plan and demonstrated an understanding of the instructions.   The patient was advised to call back or seek an in-person evaluation if the symptoms worsen or if the condition fails to improve as anticipated.  F/u: 6 mo cpe  Signed:  Santiago Bumpers, MD           02/24/2019

## 2019-04-02 ENCOUNTER — Other Ambulatory Visit: Payer: Self-pay

## 2019-04-02 DIAGNOSIS — G4700 Insomnia, unspecified: Secondary | ICD-10-CM

## 2019-04-02 MED ORDER — ZOLPIDEM TARTRATE 5 MG PO TABS: 5.0000 mg | ORAL_TABLET | Freq: Every evening | ORAL | 1 refills | Status: DC | PRN

## 2019-04-02 NOTE — Telephone Encounter (Signed)
Requesting: Zolpidem Contract:n/a UDS:n/a Last Visit:02/24/19 Next Visit: advised to f/u 47mo.CPE Last Refill:07/31/18(90,1)  Please Advise. Medication pending

## 2019-04-02 NOTE — Telephone Encounter (Signed)
Pt notified of RF via MyChart.

## 2019-05-05 LAB — HM MAMMOGRAPHY

## 2019-05-06 ENCOUNTER — Telehealth: Payer: Self-pay

## 2019-05-06 ENCOUNTER — Encounter: Payer: Self-pay | Admitting: Family Medicine

## 2019-05-06 NOTE — Telephone Encounter (Signed)
Received results from pt's recent mammogram thru [facility]. Placed on PCP desk for review

## 2019-05-07 ENCOUNTER — Encounter: Payer: Self-pay | Admitting: Family Medicine

## 2019-05-07 NOTE — Telephone Encounter (Signed)
MyChart message sent to patient about normal results.

## 2019-05-07 NOTE — Telephone Encounter (Signed)
Pls notify: Normal mammogram. Pls update HM section of EMR. Repeat mammogram 1 yr

## 2019-05-08 NOTE — Telephone Encounter (Signed)
HM updated already. Left message for pt to return call or check MyChart regarding results.

## 2019-05-09 NOTE — Telephone Encounter (Signed)
Patient advised and voiced understanding.  

## 2019-08-14 ENCOUNTER — Telehealth: Payer: BC Managed Care – PPO | Admitting: Family Medicine

## 2019-08-29 ENCOUNTER — Other Ambulatory Visit: Payer: Self-pay | Admitting: Family Medicine

## 2019-09-11 ENCOUNTER — Encounter: Payer: Self-pay | Admitting: Family Medicine

## 2019-09-11 ENCOUNTER — Other Ambulatory Visit: Payer: Self-pay | Admitting: Family Medicine

## 2019-09-11 MED ORDER — PANTOPRAZOLE SODIUM 40 MG PO TBEC: 40.0000 mg | DELAYED_RELEASE_TABLET | Freq: Every day | ORAL | 0 refills | Status: DC

## 2019-10-24 ENCOUNTER — Telehealth: Payer: Self-pay

## 2019-10-24 ENCOUNTER — Ambulatory Visit: Payer: BC Managed Care – PPO | Attending: Internal Medicine

## 2019-10-24 DIAGNOSIS — Z23 Encounter for immunization: Secondary | ICD-10-CM

## 2019-10-24 NOTE — Telephone Encounter (Signed)
Patient has a trip planned to go to [place]. She had her COVID vaccine in March. She would like to know if she should get the booster shot before her trip.

## 2019-10-24 NOTE — Telephone Encounter (Addendum)
Advised ok to get booster per PCP and location of where booster vaccines offered. Also advised to wait at least 2 weeks before getting flu vaccine

## 2019-10-24 NOTE — Progress Notes (Signed)
   Covid-19 Vaccination Clinic  Name:  Elaya Droege    MRN: [id number] DOB: 11-21-1955  10/24/2019  Ms. Hada was observed post Covid-19 immunization for 15 minutes without incident. She was provided with Vaccine Information Sheet and instruction to access the V-Safe system.   Ms. Freeburg was instructed to call 911 with any severe reactions post vaccine: Marland Kitchen Difficulty breathing  . Swelling of face and throat  . A fast heartbeat  . A bad rash all over body  . Dizziness and weakness

## 2019-10-30 ENCOUNTER — Other Ambulatory Visit: Payer: Self-pay

## 2019-10-31 ENCOUNTER — Ambulatory Visit (INDEPENDENT_AMBULATORY_CARE_PROVIDER_SITE_OTHER): Payer: BC Managed Care – PPO | Admitting: Family Medicine

## 2019-10-31 ENCOUNTER — Other Ambulatory Visit: Payer: Self-pay

## 2019-10-31 ENCOUNTER — Encounter: Payer: Self-pay | Admitting: Family Medicine

## 2019-10-31 VITALS — BP 138/86 | HR 73 | Temp 97.8°F | Resp 16 | Wt 185.4 lb

## 2019-10-31 DIAGNOSIS — E2839 Other primary ovarian failure: Secondary | ICD-10-CM

## 2019-10-31 DIAGNOSIS — Z0000 Encounter for general adult medical examination without abnormal findings: Secondary | ICD-10-CM

## 2019-10-31 DIAGNOSIS — Z1211 Encounter for screening for malignant neoplasm of colon: Secondary | ICD-10-CM | POA: Diagnosis not present

## 2019-10-31 DIAGNOSIS — G4700 Insomnia, unspecified: Secondary | ICD-10-CM

## 2019-10-31 DIAGNOSIS — E042 Nontoxic multinodular goiter: Secondary | ICD-10-CM

## 2019-10-31 DIAGNOSIS — I10 Essential (primary) hypertension: Secondary | ICD-10-CM | POA: Diagnosis not present

## 2019-10-31 DIAGNOSIS — E7800 Pure hypercholesterolemia, unspecified: Secondary | ICD-10-CM | POA: Diagnosis not present

## 2019-10-31 DIAGNOSIS — M8580 Other specified disorders of bone density and structure, unspecified site: Secondary | ICD-10-CM

## 2019-10-31 MED ORDER — PANTOPRAZOLE SODIUM 40 MG PO TBEC
40.0000 mg | DELAYED_RELEASE_TABLET | Freq: Every day | ORAL | 3 refills | Status: DC
Start: 2019-10-31 — End: 2020-05-03

## 2019-10-31 MED ORDER — ZOLPIDEM TARTRATE 5 MG PO TABS: 5.0000 mg | ORAL_TABLET | Freq: Every evening | ORAL | 1 refills | Status: DC | PRN

## 2019-10-31 MED ORDER — SULFAMETHOXAZOLE-TRIMETHOPRIM 800-160 MG PO TABS: 1.0000 | ORAL_TABLET | Freq: Two times a day (BID) | ORAL | 0 refills | Status: AC

## 2019-10-31 NOTE — Patient Instructions (Signed)
Health Maintenance, Female Adopting a healthy lifestyle and getting preventive care are important in promoting health and wellness. Ask your health care provider about:  The right schedule for you to have regular tests and exams.  Things you can do on your own to prevent diseases and keep yourself healthy. What should I know about diet, weight, and exercise? Eat a healthy diet   Eat a diet that includes plenty of vegetables, fruits, low-fat dairy products, and lean protein.  Do not eat a lot of foods that are high in solid fats, added sugars, or sodium. Maintain a healthy weight Body mass index (BMI) is used to identify weight problems. It estimates body fat based on height and weight. Your health care provider can help determine your BMI and help you achieve or maintain a healthy weight. Get regular exercise Get regular exercise. This is one of the most important things you can do for your health. Most adults should:  Exercise for at least 150 minutes each week. The exercise should increase your heart rate and make you sweat (moderate-intensity exercise).  Do strengthening exercises at least twice a week. This is in addition to the moderate-intensity exercise.  Spend less time sitting. Even light physical activity can be beneficial. Watch cholesterol and blood lipids Have your blood tested for lipids and cholesterol at 62 years of age, then have this test every 5 years. Have your cholesterol levels checked more often if:  Your lipid or cholesterol levels are high.  You are older than 62 years of age.  You are at high risk for heart disease. What should I know about cancer screening? Depending on your health history and family history, you may need to have cancer screening at various ages. This may include screening for:  Breast cancer.  Cervical cancer.  Colorectal cancer.  Skin cancer.  Lung cancer. What should I know about heart disease, diabetes, and high blood  pressure? Blood pressure and heart disease  High blood pressure causes heart disease and increases the risk of stroke. This is more likely to develop in people who have high blood pressure readings, are of African descent, or are overweight.  Have your blood pressure checked: ? Every 3-5 years if you are 18-39 years of age. ? Every year if you are 40 years old or older. Diabetes Have regular diabetes screenings. This checks your fasting blood sugar level. Have the screening done:  Once every three years after age 40 if you are at a normal weight and have a low risk for diabetes.  More often and at a younger age if you are overweight or have a high risk for diabetes. What should I know about preventing infection? Hepatitis B If you have a higher risk for hepatitis B, you should be screened for this virus. Talk with your health care provider to find out if you are at risk for hepatitis B infection. Hepatitis C Testing is recommended for:  Everyone born from 1945 through 1965.  Anyone with known risk factors for hepatitis C. Sexually transmitted infections (STIs)  Get screened for STIs, including gonorrhea and chlamydia, if: ? You are sexually active and are younger than 62 years of age. ? You are older than 62 years of age and your health care provider tells you that you are at risk for this type of infection. ? Your sexual activity has changed since you were last screened, and you are at increased risk for chlamydia or gonorrhea. Ask your health care provider if   you are at risk.  Ask your health care provider about whether you are at high risk for HIV. Your health care provider may recommend a prescription medicine to help prevent HIV infection. If you choose to take medicine to prevent HIV, you should first get tested for HIV. You should then be tested every 3 months for as long as you are taking the medicine. Pregnancy  If you are about to stop having your period (premenopausal) and  you may become pregnant, seek counseling before you get pregnant.  Take 400 to 800 micrograms (mcg) of folic acid every day if you become pregnant.  Ask for birth control (contraception) if you want to prevent pregnancy. Osteoporosis and menopause Osteoporosis is a disease in which the bones lose minerals and strength with aging. This can result in bone fractures. If you are 65 years old or older, or if you are at risk for osteoporosis and fractures, ask your health care provider if you should:  Be screened for bone loss.  Take a calcium or vitamin D supplement to lower your risk of fractures.  Be given hormone replacement therapy (HRT) to treat symptoms of menopause. Follow these instructions at home: Lifestyle  Do not use any products that contain nicotine or tobacco, such as cigarettes, e-cigarettes, and chewing tobacco. If you need help quitting, ask your health care provider.  Do not use street drugs.  Do not share needles.  Ask your health care provider for help if you need support or information about quitting drugs. Alcohol use  Do not drink alcohol if: ? Your health care provider tells you not to drink. ? You are pregnant, may be pregnant, or are planning to become pregnant.  If you drink alcohol: ? Limit how much you use to 0-1 drink a day. ? Limit intake if you are breastfeeding.  Be aware of how much alcohol is in your drink. In the [place] one drink equals one 12 oz bottle of beer (355 mL), one 5 oz glass of wine (148 mL), or one 1 oz glass of hard liquor (44 mL). General instructions  Schedule regular health, dental, and eye exams.  Stay current with your vaccines.  Tell your health care provider if: ? You often feel depressed. ? You have ever been abused or do not feel safe at home. Summary  Adopting a healthy lifestyle and getting preventive care are important in promoting health and wellness.  Follow your health care provider's instructions about healthy  diet, exercising, and getting tested or screened for diseases.  Follow your health care provider's instructions on monitoring your cholesterol and blood pressure. This information is not intended to replace advice given to you by your health care provider. Make sure you discuss any questions you have with your health care provider. Document Revised: 01/23/2018 Document Reviewed: 01/23/2018 Elsevier Patient Education  2020 [employer]  

## 2019-10-31 NOTE — Progress Notes (Signed)
Office Note 10/31/2019  CC:  Chief Complaint  Patient presents with  . Annual Exam    CPE    HPI:  Andrea Newton is a 64 y.o. White female who is here for annual health maintenance exam and f/u insomnia and HTN.  HTN: doesn't check bp at home at all. GERD: takes PPI qd and this helps great with GER.  If misses doses her GERD returns bad.  She is going overseas to [place] soon, hx of UTIs and they hit her fast and hard when they come, asks for rx to take with her.  Uses ambien intermittently, sleeps well on it and feels much better using it.  She is interested in taking it more often.  No side effect. PMP AWARE reviewed today: most recent rx for zolpidem was filled 04/27/18, # 30, rx by me. No red flags.  Past Medical History:  Diagnosis Date  . Bleeding internal hemorrhoids 2016  . Borderline hypercholesterolemia 2019   10 yr Framingham cv risk = 4.5%.  TLC recommended.  . Constipation   . GERD (gastroesophageal reflux disease)   . History of adenomatous polyp of colon 04/2014   x 1: recall 5 yrs ([facility])  . Infectious mononucleosis hepatitis college  . Multiple thyroid nodules 2010 approx; 2015   Euthyroid.  Saw ENDO (Dr. Jimmye Norman).  Repeat thyroid u/s 12/2013 showed thyromegaly with multiple nodules meeting biopsy criteria--sent pt to Dr. Elvera Lennox and plan for repeat u/s (09/2014) and one nodule had grown so bx -->benign.  Labs 03/2014 showed elevted TPO ab, supportive of Hashimoto's thyroiditis--a reassuring finding.  Pt euthyroid. 11/2018 u/s->stable. Annual TFT's with me, 2 yr f/u Dr. Olean Ree  . Osteoarthritis    knees>hips.  Takes no meds.  . Osteopenia 2017; 2019   Stable DEXA results 2017-2019.  Marland Kitchen Postmenopausal status 04/2010   FSH and LH elevated, estrogen low.  . Recurrent UTI     Past Surgical History:  Procedure Laterality Date  . BIOPSY THYROID  11/05/14   Scant follicular epithelium: benign  . Cervical cancer screening  03/11/13   No hx of  abnormals.  Neg pap and Neg HR HPV testing: next pap should be after 03/11/2018 and should include HR HPV testing as well.  . COLONOSCOPY W/ POLYPECTOMY  05/2011; 04/2014   IH and 'tics (Dr. [facility]ta Mura w/ [facility]).  Polyp 04/2014= tubular adenoma (recall 5 yrs)    . DEXA  05/2015; 07/2017   T score -2.1: osteopenia, fracture risk not high enough to recommend anything other than calcium and vit D.  June 2019: T-score -2.1.  . ENDOMETRIAL BIOPSY  2006   Performed b/c of abnormal uterine bleeding: normal (fragmented INACTIVE endometrium).  [facility], [place].  Marland Kitchen HEMORRHOID BANDING  April and May 2016   [facility]  . LASIK  2002    Family History  Problem Relation Age of Onset  . Arthritis Mother        Osteo (bilat hip replacements)  . Cancer Maternal Grandmother        ovarian  . Heart disease Maternal Grandmother   . Stroke Maternal Grandmother     Social History   Socioeconomic History  . Marital status: Married    Spouse name: Not on file  . Number of children: Not on file  . Years of education: Not on file  . Highest education level: Not on file  Occupational History  . Not on file  Tobacco Use  . Smoking  status: Never Smoker  . Smokeless tobacco: Never Used  Vaping Use  . Vaping Use: Never used  Substance and Sexual Activity  . Alcohol use: Yes    Alcohol/week: 1.0 standard drink    Types: 1 Glasses of wine per week    Comment: socially  . Drug use: No  . Sexual activity: Not on file  Other Topics Concern  . Not on file  Social History Narrative   Married, 2 children (son Mitten in his 75s, daughter age 42 at [employer]).   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   Native [address], still lives on her [place]'s farm in [place], [name].   No tobacco, rare alcohol, no drugs.  No siblings.   No exercise but "active".   Social Determinants of Health   Financial Resource Strain:   . Difficulty of Paying  Living Expenses: Not on file  Food Insecurity:   . Worried About Programme researcher, broadcasting/film/video in the Last Year: Not on file  . Ran Out of Food in the Last Year: Not on file  Transportation Needs:   . Lack of Transportation (Medical): Not on file  . Lack of Transportation (Non-Medical): Not on file  Physical Activity:   . Days of Exercise per Week: Not on file  . Minutes of Exercise per Session: Not on file  Stress:   . Feeling of Stress : Not on file  Social Connections:   . Frequency of Communication with Friends and Family: Not on file  . Frequency of Social Gatherings with Friends and Family: Not on file  . Attends Religious Services: Not on file  . Active Member of Clubs or Organizations: Not on file  . Attends Banker Meetings: Not on file  . Marital Status: Not on file  Intimate Partner Violence:   . Fear of Current or Ex-Partner: Not on file  . Emotionally Abused: Not on file  . Physically Abused: Not on file  . Sexually Abused: Not on file    Outpatient Medications Prior to Visit  Medication Sig Dispense Refill  . Calcium Carbonate (CALCIUM 600 PO) Take 600 mg by mouth daily. Take 1-2  tablets once daily.    . chlorhexidine (PERIDEX) 0.12 % solution 15 mLs 2 (two) times daily.    . cholecalciferol (VITAMIN D) 1000 UNITS tablet Take 5,000 Units by mouth daily.     . Ciclopirox 1 % shampoo     . ELIDEL 1 % cream as needed.    . hydrochlorothiazide (HYDRODIURIL) 25 MG tablet Take 1 tablet (25 mg total) by mouth daily. 90 tablet 3  . loratadine (CLARITIN) 10 MG tablet Take 10 mg by mouth daily.    . Multiple Vitamins-Minerals (PRESERVISION AREDS 2+MULTI VIT PO)     . saccharomyces boulardii (FLORASTOR) 250 MG capsule Take by mouth daily.    . pantoprazole (PROTONIX) 40 MG tablet Take 1 tablet (40 mg total) by mouth daily. 30 tablet 0  . zolpidem (AMBIEN) 5 MG tablet Take 1 tablet (5 mg total) by mouth at bedtime as needed. for sleep 90 tablet 1   No  facility-administered medications prior to visit.    Allergies  Allergen Reactions  . Penicillins Rash    ROS Review of Systems  Constitutional: Negative for appetite change, chills, fatigue and fever.  HENT: Negative for congestion, dental problem, ear pain and sore throat.   Eyes: Negative for discharge, redness and visual disturbance.  Respiratory: Negative for cough, chest tightness,  shortness of breath and wheezing.   Cardiovascular: Negative for chest pain, palpitations and leg swelling.  Gastrointestinal: Negative for abdominal pain, blood in stool, diarrhea, nausea and vomiting.  Genitourinary: Negative for difficulty urinating, dysuria, flank pain, frequency, hematuria and urgency.  Musculoskeletal: Negative for arthralgias, back pain, joint swelling, myalgias and neck stiffness.  Skin: Negative for pallor and rash.  Neurological: Negative for dizziness, speech difficulty, weakness and headaches.  Hematological: Negative for adenopathy. Does not bruise/bleed easily.  Psychiatric/Behavioral: Negative for confusion and sleep disturbance. The patient is not nervous/anxious.     PE; Vitals with BMI 10/31/2019 02/24/2019 10/30/2018  Height - - 5\' 6"   Weight 185 lbs 6 oz - 187 lbs 10 oz  BMI - - 30.29  Systolic 138 133 [id number]  Diastolic 86 77 82  Pulse 73 78 82   Exam chaperoned by female CMA Gen: Alert, well appearing.  Patient is oriented to person, place, time, and situation. AFFECT: pleasant, lucid thought and speech. ENT: Ears: EACs clear, normal epithelium.  TMs with good light reflex and landmarks bilaterally.  Eyes: no injection, icteris, swelling, or exudate.  EOMI, PERRLA. Nose: no drainage or turbinate edema/swelling.  No injection or focal lesion.  Mouth: lips without lesion/swelling.  Oral mucosa pink and moist.  Dentition intact and without obvious caries or gingival swelling.  Oropharynx without erythema, exudate, or swelling.  Neck: supple/nontender.  No LAD, mass,  or TM.  Carotid pulses 2+ bilaterally, without bruits. CV: RRR, no m/r/g.   LUNGS: CTA bilat, nonlabored resps, good aeration in all lung fields. ABD: soft, NT, ND, BS normal.  No hepatospenomegaly or mass.  No bruits. EXT: no clubbing, cyanosis, or edema.  Musculoskeletal: no joint swelling, erythema, warmth, or tenderness.  ROM of all joints intact. Skin - no sores or suspicious lesions or rashes or color changes   Pertinent labs:  Lab Results  Component Value Date   TSH 3.47 07/31/2018   Lab Results  Component Value Date   WBC 5.2 07/31/2018   HGB 13.2 07/31/2018   HCT 39.5 07/31/2018   MCV 91.6 07/31/2018   PLT 235.0 07/31/2018   Lab Results  Component Value Date   CREATININE 0.65 08/14/2018   BUN 18 08/14/2018   NA 141 08/14/2018   K 4.2 08/14/2018   CL 102 08/14/2018   CO2 32 08/14/2018   Lab Results  Component Value Date   ALT 17 07/31/2018   AST 12 07/31/2018   ALKPHOS 87 07/31/2018   BILITOT 0.4 07/31/2018   Lab Results  Component Value Date   CHOL 222 (H) 07/31/2018   Lab Results  Component Value Date   HDL 48.70 07/31/2018   Lab Results  Component Value Date   LDLCALC 148 (H) 07/31/2018   Lab Results  Component Value Date   TRIG 123.0 07/31/2018   Lab Results  Component Value Date   CHOLHDL 5 07/31/2018   Lab Results  Component Value Date   HGBA1C 5.6 01/02/2014    ASSESSMENT AND PLAN:   1) HTN: well controlled but needs to do some routine home monitoring. Work more on low na diet and increased CV exercise.  2) Insomnia: she would like to use ambien 5mg  on (almost) a nightly basis now. I'm okay with this.  RF'd ambien 5mg , 1 qhs prn, #90, RF x 1.  3) Multiple thyroid nodules: followed by Dr. Elvera LennoxGherghe. I get her routine thyroid panel--ordered.  4) Health maintenance exam: Reviewed age and gender appropriate health maintenance issues (  prudent diet, regular exercise, health risks of tobacco and excessive alcohol, use of seatbelts, fire  alarms in home, use of sunscreen).  Also reviewed age and gender appropriate health screening as well as vaccine recommendations. Vaccines:  ALL UTD.  Flu->she declined. Labs: health panel labs (+free T4 and T3 total) ordered--she is not fasting so she'll return for fasting lab visit. Cervical ca screening: pap due 2025 (to be done by me and it will be her last one if normal!). Breast ca screening: normal 05/05/19--repeat 1 yr. Colon ca screening:  Adenoma this year (2021), plan for 5 yr recall ([facility]). Osteoporosis screening: DEXA due for repeat->ordered.  An After Visit Summary was printed and given to the patient.  FOLLOW UP:  Return in about 6 months (around 04/29/2020) for f/u HTN and insomnia (ambien).  Signed:  Santiago Bumpers, MD           10/31/2019

## 2019-11-24 ENCOUNTER — Ambulatory Visit (INDEPENDENT_AMBULATORY_CARE_PROVIDER_SITE_OTHER): Payer: BC Managed Care – PPO

## 2019-11-24 ENCOUNTER — Other Ambulatory Visit: Payer: Self-pay

## 2019-11-24 ENCOUNTER — Encounter: Payer: Self-pay | Admitting: Family Medicine

## 2019-11-24 DIAGNOSIS — Z23 Encounter for immunization: Secondary | ICD-10-CM

## 2019-11-24 DIAGNOSIS — I10 Essential (primary) hypertension: Secondary | ICD-10-CM

## 2019-11-24 DIAGNOSIS — E7800 Pure hypercholesterolemia, unspecified: Secondary | ICD-10-CM | POA: Diagnosis not present

## 2019-11-24 DIAGNOSIS — E042 Nontoxic multinodular goiter: Secondary | ICD-10-CM

## 2019-11-24 LAB — CBC WITH DIFFERENTIAL/PLATELET
Basophils Absolute: 0.1 10*3/uL (ref 0.0–0.1)
Basophils Relative: 1.2 % (ref 0.0–3.0)
Eosinophils Absolute: 0.1 10*3/uL (ref 0.0–0.7)
Eosinophils Relative: 1.9 % (ref 0.0–5.0)
HCT: 40.2 % (ref 36.0–46.0)
Hemoglobin: 13.3 g/dL (ref 12.0–15.0)
Lymphocytes Relative: 25.5 % (ref 12.0–46.0)
Lymphs Abs: 1.5 10*3/uL (ref 0.7–4.0)
MCHC: 33.1 g/dL (ref 30.0–36.0)
MCV: 92.3 fl (ref 78.0–100.0)
Monocytes Absolute: 0.3 10*3/uL (ref 0.1–1.0)
Monocytes Relative: 5.8 % (ref 3.0–12.0)
Neutro Abs: 3.9 10*3/uL (ref 1.4–7.7)
Neutrophils Relative %: 65.6 % (ref 43.0–77.0)
Platelets: 252 10*3/uL (ref 150.0–400.0)
RBC: 4.35 Mil/uL (ref 3.87–5.11)
RDW: 13.7 % (ref 11.5–15.5)
WBC: 5.9 10*3/uL (ref 4.0–10.5)

## 2019-11-24 LAB — COMPREHENSIVE METABOLIC PANEL
ALT: 16 U/L (ref 0–35)
AST: 12 U/L (ref 0–37)
Albumin: 4.4 g/dL (ref 3.5–5.2)
Alkaline Phosphatase: 74 U/L (ref 39–117)
BUN: 16 mg/dL (ref 6–23)
CO2: 31 mEq/L (ref 19–32)
Calcium: 9.3 mg/dL (ref 8.4–10.5)
Chloride: 101 mEq/L (ref 96–112)
Creatinine, Ser: 0.66 mg/dL (ref 0.40–1.20)
GFR: 94.58 mL/min (ref >60.00)
Glucose, Bld: 88 mg/dL (ref 70–99)
Potassium: 3.9 mEq/L (ref 3.5–5.1)
Sodium: 141 mEq/L (ref 135–145)
Total Bilirubin: 0.5 mg/dL (ref 0.2–1.2)
Total Protein: 7.3 g/dL (ref 6.0–8.3)

## 2019-11-24 LAB — TSH: TSH: 2.85 u[IU]/mL (ref 0.35–4.50)

## 2019-11-24 LAB — LIPID PANEL
Cholesterol: 213 mg/dL — ABNORMAL HIGH (ref 0–200)
HDL: 54.7 mg/dL (ref >39.00)
LDL Cholesterol: 144 mg/dL — ABNORMAL HIGH (ref 0–99)
NonHDL: 158.21
Total CHOL/HDL Ratio: 4
Triglycerides: 73 mg/dL (ref 0.0–149.0)
VLDL: 14.6 mg/dL (ref 0.0–40.0)

## 2019-11-24 LAB — T4, FREE: Free T4: 0.85 ng/dL (ref 0.60–1.60)

## 2019-11-25 ENCOUNTER — Encounter: Payer: Self-pay | Admitting: Family Medicine

## 2019-11-25 LAB — T3: T3, Total: 119 ng/dL (ref 76–181)

## 2019-11-26 ENCOUNTER — Telehealth: Payer: Self-pay | Admitting: Family Medicine

## 2019-11-26 NOTE — Telephone Encounter (Signed)
Pt returning call for lab results  

## 2019-11-26 NOTE — Telephone Encounter (Signed)
notified pt of lab results

## 2020-01-05 ENCOUNTER — Telehealth: Payer: Self-pay | Admitting: Family Medicine

## 2020-01-05 DIAGNOSIS — G4700 Insomnia, unspecified: Secondary | ICD-10-CM

## 2020-01-06 NOTE — Telephone Encounter (Signed)
Patient called regarding this medication denial. Stated she just had a physical on 10/31/2019 and so this should be refilled. Please send to same [facility] in [place].

## 2020-01-06 NOTE — Telephone Encounter (Signed)
Pt was informed that rx was denied due to refill being too soon. Rx was sent in on 9/17 for 6 mos. Pt was advise to call pharm for refill

## 2020-02-17 ENCOUNTER — Other Ambulatory Visit: Payer: Self-pay | Admitting: Family Medicine

## 2020-03-18 ENCOUNTER — Encounter: Payer: Self-pay | Admitting: Family Medicine

## 2020-03-18 ENCOUNTER — Ambulatory Visit
Admission: RE | Admit: 2020-03-18 | Discharge: 2020-03-18 | Disposition: A | Discharge status: Home / Self Care | Payer: BC Managed Care – PPO | Source: Ambulatory Visit | Attending: Family Medicine | Admitting: Family Medicine

## 2020-03-18 ENCOUNTER — Other Ambulatory Visit: Payer: Self-pay

## 2020-03-18 DIAGNOSIS — M8580 Other specified disorders of bone density and structure, unspecified site: Secondary | ICD-10-CM

## 2020-03-18 DIAGNOSIS — E2839 Other primary ovarian failure: Secondary | ICD-10-CM

## 2020-04-29 ENCOUNTER — Ambulatory Visit: Payer: BC Managed Care – PPO | Admitting: Family Medicine

## 2020-05-03 ENCOUNTER — Ambulatory Visit: Payer: BC Managed Care – PPO | Admitting: Family Medicine

## 2020-05-03 ENCOUNTER — Encounter: Payer: Self-pay | Admitting: Family Medicine

## 2020-05-03 ENCOUNTER — Other Ambulatory Visit: Payer: Self-pay

## 2020-05-03 VITALS — BP 120/80 | HR 71 | Temp 98.5°F | Resp 16 | Ht 66.0 in | Wt 186.6 lb

## 2020-05-03 DIAGNOSIS — G4700 Insomnia, unspecified: Secondary | ICD-10-CM | POA: Diagnosis not present

## 2020-05-03 DIAGNOSIS — I10 Essential (primary) hypertension: Secondary | ICD-10-CM | POA: Diagnosis not present

## 2020-05-03 DIAGNOSIS — K219 Gastro-esophageal reflux disease without esophagitis: Secondary | ICD-10-CM | POA: Diagnosis not present

## 2020-05-03 MED ORDER — HYDROCHLOROTHIAZIDE 25 MG PO TABS: 25.0000 mg | ORAL_TABLET | Freq: Every day | ORAL | 3 refills | Status: DC

## 2020-05-03 MED ORDER — PANTOPRAZOLE SODIUM 40 MG PO TBEC: 40.0000 mg | DELAYED_RELEASE_TABLET | Freq: Every day | ORAL | 3 refills | Status: DC

## 2020-05-03 NOTE — Progress Notes (Signed)
OFFICE VISIT  05/03/2020  CC:  Chief Complaint  Patient presents with  . Follow-up    Hypertension, insomnia. Pt is not fasting    HPI:    Patient is a 65 y.o. Caucasian female who presents for 6 mo f/u HTN, GERD, and insomnia. A/P as of last visit: "1) HTN: well controlled but needs to do some routine home monitoring. Work more on low na diet and increased CV exercise.  2) Insomnia: she would like to use ambien 5mg  on (almost) a nightly basis now. I'm okay with this.  RF'd ambien 5mg , 1 qhs prn, #90, RF x 1.  3) Multiple thyroid nodules: followed by Dr. . I get her routine thyroid panel--ordered.  4) Health maintenance exam: Reviewed age and gender appropriate health maintenance issues (prudent diet, regular exercise, health risks of tobacco and excessive alcohol, use of seatbelts, fire alarms in home, use of sunscreen).  Also reviewed age and gender appropriate health screening as well as vaccine recommendations. Vaccines:  ALL UTD.  Flu->she declined. Labs: health panel labs (+free T4 and T3 total) ordered--she is not fasting so she'll return for fasting lab visit. Cervical ca screening: pap due 2025 (to be done by me and it will be her last one if normal!). Breast ca screening: normal 05/05/19--repeat 1 yr. Colon ca screening:  Adenoma this year (2021), plan for 5 yr recall ([facility]). Osteoporosis screening: DEXA due for repeat->ordered."  INTERIM HX: Doing well. Her 05/07/19 trip went well, plans a [place] cruise next.   Staying active, trying to start walking more for exercise. Diet pretty good, esp the last 10d watching carbs more. HTN: not monitoring bp at home.  Taking hctz 25mg  qd.  Sleep: taking ambien 5mg  avg 1-3 times a week.  Very helpful.  GERD: taking pantop qd; if misses a dose she has breakthrough GERD.  PMP AWARE reviewed today: most recent rx for ambien 5mg  was filled 04/09/20, # 30, rx by me. No red flags.   Past Medical History:   Diagnosis Date  . Bleeding internal hemorrhoids 2016  . Borderline hypercholesterolemia 2019   10 yr Framingham cv risk = 4.5%.  TLC recommended.  . Constipation   . Essential hypertension   . GERD (gastroesophageal reflux disease)   . History of adenomatous polyp of colon 04/2014   x 1: recall 5 yrs ([facility])  . Infectious mononucleosis hepatitis college  . Multiple thyroid nodules 2010 approx; 2015   Euthyroid.  Saw ENDO (Dr. ).  Repeat thyroid u/s 12/2013 showed thyromegaly with multiple nodules meeting biopsy criteria--sent pt to Dr. 2017 and plan for repeat u/s (09/2014) and one nodule had grown so bx -->benign.  Labs 03/2014 showed elevted TPO ab, supportive of Hashimoto's thyroiditis--a reassuring finding.  Pt euthyroid. 11/2018 u/s->stable. Annual TFT's with me, 2 yr f/u Dr. 01/2014  . Osteoarthritis    knees>hips.  Takes no meds.  . Osteopenia 2017; 2019; 03/2020   Stable DEXA results 2017-2019. T score -2.18 Mar 2020.  2018 Postmenopausal status 04/2010   FSH and LH elevated, estrogen low.  . Recurrent UTI     Past Surgical History:  Procedure Laterality Date  . BIOPSY THYROID  11/05/14   Scant follicular epithelium: benign  . Cervical cancer screening  03/11/13   No hx of abnormals.  Neg pap and Neg HR HPV testing: next pap should be after 03/11/2018 and should include HR HPV testing as well.  . COLONOSCOPY W/ POLYPECTOMY  05/2011; 04/2014  IH and 'tics (Dr. Rhetta Mura w/ [facility]).  Polyp 04/2014= tubular adenoma (recall 5 yrs)    . DEXA  05/2015; 07/2017; 03/18/2020   T score -2.1: osteopenia, fracture risk not high enough to recommend anything other than calcium and vit D.  June 2019: T-score -2.1. 03/2020 T score -2.3.  . ENDOMETRIAL BIOPSY  2006   Performed b/c of abnormal uterine bleeding: normal (fragmented INACTIVE endometrium).  [facility] [place].  Marland Kitchen HEMORRHOID BANDING  April and May 2016   [facility]  . LASIK  2002     Outpatient Medications Prior to Visit  Medication Sig Dispense Refill  . Calcium Carbonate (CALCIUM 600 PO) Take 600 mg by mouth daily. Take 1-2  tablets once daily.    . Ciclopirox 1 % shampoo     . ELIDEL 1 % cream as needed.    . loratadine (CLARITIN) 10 MG tablet Take 10 mg by mouth daily.    . Multiple Vitamins-Minerals (PRESERVISION AREDS 2+MULTI VIT PO)     . saccharomyces boulardii (FLORASTOR) 250 MG capsule Take by mouth daily.    Marland Kitchen zolpidem (AMBIEN) 5 MG tablet Take 1 tablet (5 mg total) by mouth at bedtime as needed. 90 tablet 1  . cholecalciferol (VITAMIN D) 1000 UNITS tablet Take 5,000 Units by mouth daily.    . hydrochlorothiazide (HYDRODIURIL) 25 MG tablet TAKE 1 TABLET BY MOUTH EVERY DAY 90 tablet 1  . pantoprazole (PROTONIX) 40 MG tablet Take 1 tablet (40 mg total) by mouth daily. 90 tablet 3  . chlorhexidine (PERIDEX) 0.12 % solution 15 mLs 2 (two) times daily. (Patient not taking: Reported on 05/03/2020)     No facility-administered medications prior to visit.    Allergies  Allergen Reactions  . Penicillins Rash    ROS As per HPI  PE: Vitals with BMI 05/03/2020 10/31/2019 02/24/2019  Height 5\' 6"  - -  Weight 186 lbs 10 oz 185 lbs 6 oz -  BMI 30.13 - -  Systolic 120 138  Diastolic 80 86 77  Pulse 71 73 78     Gen: Alert, well appearing.  Patient is oriented to person, place, time, and situation. AFFECT: pleasant, lucid thought and speech. CV: RRR, no m/r/g.   LUNGS: CTA bilat, nonlabored resps, good aeration in all lung fields. EXT: no clubbing or cyanosis.  no edema.    LABS:  Lab Results  Component Value Date   TSH 2.85 11/24/2019   Lab Results  Component Value Date   WBC 5.9 11/24/2019   HGB 13.3 11/24/2019   HCT 40.2 11/24/2019   MCV 92.3 11/24/2019   PLT 252.0 11/24/2019   Lab Results  Component Value Date   CREATININE 0.66 11/24/2019   BUN 16 11/24/2019   NA 141 11/24/2019   K 3.9 11/24/2019   CL 101 11/24/2019   CO2 31  11/24/2019   Lab Results  Component Value Date   ALT 16 11/24/2019   AST 12 11/24/2019   ALKPHOS 74 11/24/2019   BILITOT 0.5 11/24/2019   Lab Results  Component Value Date   CHOL 213 (H) 11/24/2019   Lab Results  Component Value Date   HDL 54.70 11/24/2019   Lab Results  Component Value Date   LDLCALC 144 (H) 11/24/2019   Lab Results  Component Value Date   TRIG 73.0 11/24/2019   Lab Results  Component Value Date   CHOLHDL 4 11/24/2019   Lab Results  Component Value Date  HGBA1C 5.6 01/02/2014   IMPRESSION AND PLAN:  1) HTN: well controlled.  Cont hctz 25mg  qd. Longstanding stability of sCr and electrolytes while taking hctz-->Ok to forego repeat labs today and will plan rpt at CPE in 22mo.  2) Insomnia: doing well with prn use of ambien 5mg . No new rx needed today.  3) GERD: stable on long term daily PPI therapy. Has recurrence of GERD sx's if misses one dose.  4) Multiple thyroid nodules, euthyroid longterm, plan rpt thyroid panel at cpe in 34mo, pt followed by endo.  An After Visit Summary was printed and given to the patient.  FOLLOW UP: Return in about 6 months (around 11/03/2020) for annual CPE (fasting).  Signed:  5mo, MD           05/03/2020

## 2020-07-20 ENCOUNTER — Other Ambulatory Visit: Payer: Self-pay | Admitting: Family Medicine

## 2020-07-20 DIAGNOSIS — G4700 Insomnia, unspecified: Secondary | ICD-10-CM

## 2020-07-20 NOTE — Telephone Encounter (Signed)
Left detailed message advising refill sent, okay per DPR 

## 2020-07-20 NOTE — Telephone Encounter (Signed)
Requesting: Zolpidem Contract: N/A UDS: N/A Last Visit:05/03/20 Next Visit: 11/04/20 Last Refill: 9/17/214(90,1)  Please Advise. Medication pending

## 2020-11-03 ENCOUNTER — Other Ambulatory Visit: Payer: Self-pay

## 2020-11-04 ENCOUNTER — Encounter: Payer: Self-pay | Admitting: Family Medicine

## 2020-11-04 ENCOUNTER — Ambulatory Visit (INDEPENDENT_AMBULATORY_CARE_PROVIDER_SITE_OTHER): Payer: BC Managed Care – PPO | Admitting: Family Medicine

## 2020-11-04 VITALS — BP 145/83 | HR 74 | Temp 98.0°F | Ht 66.0 in | Wt 185.4 lb

## 2020-11-04 DIAGNOSIS — Z0000 Encounter for general adult medical examination without abnormal findings: Secondary | ICD-10-CM

## 2020-11-04 DIAGNOSIS — I10 Essential (primary) hypertension: Secondary | ICD-10-CM

## 2020-11-04 DIAGNOSIS — E042 Nontoxic multinodular goiter: Secondary | ICD-10-CM | POA: Diagnosis not present

## 2020-11-04 DIAGNOSIS — Z1231 Encounter for screening mammogram for malignant neoplasm of breast: Secondary | ICD-10-CM

## 2020-11-04 DIAGNOSIS — M25561 Pain in right knee: Secondary | ICD-10-CM | POA: Diagnosis not present

## 2020-11-04 DIAGNOSIS — F5101 Primary insomnia: Secondary | ICD-10-CM

## 2020-11-04 DIAGNOSIS — K219 Gastro-esophageal reflux disease without esophagitis: Secondary | ICD-10-CM

## 2020-11-04 DIAGNOSIS — Z79899 Other long term (current) drug therapy: Secondary | ICD-10-CM

## 2020-11-04 LAB — CBC WITH DIFFERENTIAL/PLATELET
Basophils Absolute: 0 10*3/uL (ref 0.0–0.1)
Basophils Relative: 0.9 % (ref 0.0–3.0)
Eosinophils Absolute: 0.1 10*3/uL (ref 0.0–0.7)
Eosinophils Relative: 1.4 % (ref 0.0–5.0)
HCT: 38.8 % (ref 36.0–46.0)
Hemoglobin: 13 g/dL (ref 12.0–15.0)
Lymphocytes Relative: 31.7 % (ref 12.0–46.0)
Lymphs Abs: 1.3 10*3/uL (ref 0.7–4.0)
MCHC: 33.5 g/dL (ref 30.0–36.0)
MCV: 90.3 fl (ref 78.0–100.0)
Monocytes Absolute: 0.4 10*3/uL (ref 0.1–1.0)
Monocytes Relative: 8.5 % (ref 3.0–12.0)
Neutro Abs: 2.4 10*3/uL (ref 1.4–7.7)
Neutrophils Relative %: 57.5 % (ref 43.0–77.0)
Platelets: 235 10*3/uL (ref 150.0–400.0)
RBC: 4.3 Mil/uL (ref 3.87–5.11)
RDW: 13.8 % (ref 11.5–15.5)
WBC: 4.2 10*3/uL (ref 4.0–10.5)

## 2020-11-04 LAB — LIPID PANEL
Cholesterol: 222 mg/dL — ABNORMAL HIGH (ref 0–200)
HDL: 52.8 mg/dL (ref >39.00)
LDL Cholesterol: 152 mg/dL — ABNORMAL HIGH (ref 0–99)
NonHDL: 169.55
Total CHOL/HDL Ratio: 4
Triglycerides: 86 mg/dL (ref 0.0–149.0)
VLDL: 17.2 mg/dL (ref 0.0–40.0)

## 2020-11-04 LAB — COMPREHENSIVE METABOLIC PANEL
ALT: 23 U/L (ref 0–35)
AST: 17 U/L (ref 0–37)
Albumin: 4.2 g/dL (ref 3.5–5.2)
Alkaline Phosphatase: 73 U/L (ref 39–117)
BUN: 17 mg/dL (ref 6–23)
CO2: 29 mEq/L (ref 19–32)
Calcium: 9.3 mg/dL (ref 8.4–10.5)
Chloride: 102 mEq/L (ref 96–112)
Creatinine, Ser: 0.7 mg/dL (ref 0.40–1.20)
GFR: 92.26 mL/min (ref >60.00)
Glucose, Bld: 104 mg/dL — ABNORMAL HIGH (ref 70–99)
Potassium: 4.1 mEq/L (ref 3.5–5.1)
Sodium: 139 mEq/L (ref 135–145)
Total Bilirubin: 0.3 mg/dL (ref 0.2–1.2)
Total Protein: 7.1 g/dL (ref 6.0–8.3)

## 2020-11-04 LAB — T4, FREE: Free T4: 0.85 ng/dL (ref 0.60–1.60)

## 2020-11-04 LAB — TSH: TSH: 4.12 u[IU]/mL (ref 0.35–5.50)

## 2020-11-04 MED ORDER — METHYLPREDNISOLONE ACETATE 40 MG/ML IJ SUSP
40.0000 mg | Freq: Once | INTRAMUSCULAR | Status: AC
  Administered 2020-11-04: 40 mg via INTRAMUSCULAR

## 2020-11-04 NOTE — Addendum Note (Signed)
Addended by: Emi Holes D on: 11/04/2020 10:52 AM   Modules accepted: Orders

## 2020-11-04 NOTE — Patient Instructions (Signed)
Health Maintenance, Female Adopting a healthy lifestyle and getting preventive care are important in promoting health and wellness. Ask your health care provider about: The right schedule for you to have regular tests and exams. Things you can do on your own to prevent diseases and keep yourself healthy. What should I know about diet, weight, and exercise? Eat a healthy diet  Eat a diet that includes plenty of vegetables, fruits, low-fat dairy products, and lean protein. Do not eat a lot of foods that are high in solid fats, added sugars, or sodium. Maintain a healthy weight Body mass index (BMI) is used to identify weight problems. It estimates body fat based on height and weight. Your health care provider can help determine your BMI and help you achieve or maintain a healthy weight. Get regular exercise Get regular exercise. This is one of the most important things you can do for your health. Most adults should: Exercise for at least 150 minutes each week. The exercise should increase your heart rate and make you sweat (moderate-intensity exercise). Do strengthening exercises at least twice a week. This is in addition to the moderate-intensity exercise. Spend less time sitting. Even light physical activity can be beneficial. Watch cholesterol and blood lipids Have your blood tested for lipids and cholesterol at 63 years of age, then have this test every 5 years. Have your cholesterol levels checked more often if: Your lipid or cholesterol levels are high. You are older than 63 years of age. You are at high risk for heart disease. What should I know about cancer screening? Depending on your health history and family history, you may need to have cancer screening at various ages. This may include screening for: Breast cancer. Cervical cancer. Colorectal cancer. Skin cancer. Lung cancer. What should I know about heart disease, diabetes, and high blood pressure? Blood pressure and heart  disease High blood pressure causes heart disease and increases the risk of stroke. This is more likely to develop in people who have high blood pressure readings, are of African descent, or are overweight. Have your blood pressure checked: Every 3-5 years if you are 18-39 years of age. Every year if you are 40 years old or older. Diabetes Have regular diabetes screenings. This checks your fasting blood sugar level. Have the screening done: Once every three years after age 40 if you are at a normal weight and have a low risk for diabetes. More often and at a younger age if you are overweight or have a high risk for diabetes. What should I know about preventing infection? Hepatitis B If you have a higher risk for hepatitis B, you should be screened for this virus. Talk with your health care provider to find out if you are at risk for hepatitis B infection. Hepatitis C Testing is recommended for: Everyone born from 1945 through 1965. Anyone with known risk factors for hepatitis C. Sexually transmitted infections (STIs) Get screened for STIs, including gonorrhea and chlamydia, if: You are sexually active and are younger than 63 years of age. You are older than 63 years of age and your health care provider tells you that you are at risk for this type of infection. Your sexual activity has changed since you were last screened, and you are at increased risk for chlamydia or gonorrhea. Ask your health care provider if you are at risk. Ask your health care provider about whether you are at high risk for HIV. Your health care provider may recommend a prescription medicine   to help prevent HIV infection. If you choose to take medicine to prevent HIV, you should first get tested for HIV. You should then be tested every 3 months for as long as you are taking the medicine. Pregnancy If you are about to stop having your period (premenopausal) and you may become pregnant, seek counseling before you get  pregnant. Take 400 to 800 micrograms (mcg) of folic acid every day if you become pregnant. Ask for birth control (contraception) if you want to prevent pregnancy. Osteoporosis and menopause Osteoporosis is a disease in which the bones lose minerals and strength with aging. This can result in bone fractures. If you are 65 years old or older, or if you are at risk for osteoporosis and fractures, ask your health care provider if you should: Be screened for bone loss. Take a calcium or vitamin D supplement to lower your risk of fractures. Be given hormone replacement therapy (HRT) to treat symptoms of menopause. Follow these instructions at home: Lifestyle Do not use any products that contain nicotine or tobacco, such as cigarettes, e-cigarettes, and chewing tobacco. If you need help quitting, ask your health care provider. Do not use street drugs. Do not share needles. Ask your health care provider for help if you need support or information about quitting drugs. Alcohol use Do not drink alcohol if: Your health care provider tells you not to drink. You are pregnant, may be pregnant, or are planning to become pregnant. If you drink alcohol: Limit how much you use to 0-1 drink a day. Limit intake if you are breastfeeding. Be aware of how much alcohol is in your drink. In the [place] one drink equals one 12 oz bottle of beer (355 mL), one 5 oz glass of wine (148 mL), or one 1 oz glass of hard liquor (44 mL). General instructions Schedule regular health, dental, and eye exams. Stay current with your vaccines. Tell your health care provider if: You often feel depressed. You have ever been abused or do not feel safe at home. Summary Adopting a healthy lifestyle and getting preventive care are important in promoting health and wellness. Follow your health care provider's instructions about healthy diet, exercising, and getting tested or screened for diseases. Follow your health care provider's  instructions on monitoring your cholesterol and blood pressure. This information is not intended to replace advice given to you by your health care provider. Make sure you discuss any questions you have with your health care provider. Document Revised: 04/09/2020 Document Reviewed: 01/23/2018 Elsevier Patient Education  2022 Elsevier Inc.  

## 2020-11-04 NOTE — Progress Notes (Signed)
Office Note 11/04/2020  CC:  Chief Complaint  Patient presents with   Annual Exam    Pt is fasting    HPI:  Patient is a 65 y.o. WF who is here for annual health maintenance exam and f/u HTN and insomnia.  Home bp's: 120s/80  Just recently started phentermine via bariatric clinic.  Struggles with consistent good diet/exercise.  Insomnia responds well to prn ambien, uses 8-10 per month avg. PMP AWARE reviewed today: most recent rx for ambien 5mg  was filled 07/21/20, # 30, rx by me. No red flags.  Describes "tweeking" R knee coming down a ladder about 2 mo ago. No swelling.  Waxing and waning pain mostly in post aspect of knee since then, worse after up on it a lot, better with ice and prn aleve. Had no signif knee problems prior.    Past Medical History:  Diagnosis Date   Bleeding internal hemorrhoids 2016   Borderline hypercholesterolemia 2019   10 yr Fr[place]ham cv risk = 4.5%.  TLC recommended.   Constipation    Essential hypertension    GERD (gastroesophageal reflux disease)    History of adenomatous polyp of colon 04/2014   x 1: recall 5 yrs ([facility])   Infectious mononucleosis hepatitis college   Multiple thyroid nodules 2010 approx; 2015   Euthyroid.  Saw ENDO (Dr. 05/2014).  Repeat thyroid u/s 12/2013 showed thyromegaly with multiple nodules meeting biopsy criteria--sent pt to Dr. 01/2014 and plan for repeat u/s (09/2014) and one nodule had grown so bx -->benign.  Labs 03/2014 showed elevted TPO ab, supportive of Hashimoto's thyroiditis--a reassuring finding.  Pt euthyroid. 11/2018 u/s->stable. Annual TFT's with me, 2 yr f/u Dr. 12/2018   Osteoarthritis    knees>hips.  Takes no meds.   Osteopenia 2017; 2019; 03/2020   Stable DEXA results 2017-2019. T score -2.18 Mar 2020. Rpt 2024.   Postmenopausal status 04/2010   FSH and LH elevated, estrogen low.   Recurrent UTI     Past Surgical History:  Procedure Laterality Date   BIOPSY THYROID  11/05/14   Scant  follicular epithelium: benign   Cervical cancer screening  03/11/13   No hx of abnormals.  Neg pap and Neg HR HPV testing: next pap should be after 03/11/2018 and should include HR HPV testing as well.   COLONOSCOPY W/ POLYPECTOMY  05/2011; 04/2014   IH and 'tics (Dr. 05/2014 [facility]/ [facility]).  Polyp 04/2014= tubular adenoma (recall 5 yrs)     DEXA  05/2015; 07/2017; 03/18/2020   T score -2.1: osteopenia, fracture risk not high enough to recommend anything other than calcium and vit D.  June 2019: T-score -2.1. 03/2020 T score -2.3. Plan rpt 2024.   ENDOMETRIAL BIOPSY  2006   Performed b/c of abnormal uterine bleeding: normal (fragmented INACTIVE endometrium).  [facility] [place], [place].   HEMORRHOID BANDING  April and May 2016   [facility]   LASIK  2002    Family History  Problem Relation Age of Onset   Arthritis Mother        Osteo (bilat hip replacements)   Cancer Maternal Grandmother        ovarian   Heart disease Maternal Grandmother    Stroke Maternal Grandmother     Social History   Socioeconomic History   Marital status: Married    Spouse name: Not on file   Number of children: Not on file   Years of education: Not on file   Highest  education level: Not on file  Occupational History   Not on file  Tobacco Use   Smoking status: Never   Smokeless tobacco: Never  Vaping Use   Vaping Use: Never used  Substance and Sexual Activity   Alcohol use: Yes    Alcohol/week: 1.0 standard drink    Types: 1 Glasses of wine per week    Comment: socially   Drug use: No   Sexual activity: Not on file  Other Topics Concern   Not on file  Social History Narrative   Married, 2 children (son Hitch in his 41s, daughter age 52 at [employer]).   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   Native [address], still lives on her family's farm in [place], [name].   No tobacco, rare alcohol, no drugs.  No siblings.   No exercise but "active".    Social Determinants of Health   Financial Resource Strain: Not on file  Food Insecurity: Not on file  Transportation Needs: Not on file  Physical Activity: Not on file  Stress: Not on file  Social Connections: Not on file  Intimate Partner Violence: Not on file    Outpatient Medications Prior to Visit  Medication Sig Dispense Refill   Calcium Carbonate (CALCIUM 600 PO) Take 600 mg by mouth daily. Take 1-2  tablets once daily.     Ciclopirox 1 % shampoo every other day.     ELIDEL 1 % cream as needed.     Famotidine (PEPCID PO) Take by mouth as needed.     hydrochlorothiazide (HYDRODIURIL) 25 MG tablet Take 1 tablet (25 mg total) by mouth daily. 90 tablet 3   loratadine (CLARITIN) 10 MG tablet Take 10 mg by mouth daily.     Multiple Vitamins-Minerals (PRESERVISION AREDS 2+MULTI VIT PO)      pantoprazole (PROTONIX) 40 MG tablet Take 1 tablet (40 mg total) by mouth daily. (Patient taking differently: Take 40 mg by mouth every other day.) 90 tablet 3   saccharomyces boulardii (FLORASTOR) 250 MG capsule Take by mouth daily.     zolpidem (AMBIEN) 5 MG tablet TAKE 1 TABLET(5 MG) BY MOUTH AT BEDTIME AS NEEDED 90 tablet 1   No facility-administered medications prior to visit.    Allergies  Allergen Reactions   Penicillins Rash    ROS Review of Systems  Constitutional:  Negative for appetite change, chills, fatigue and fever.  HENT:  Negative for congestion, dental problem, ear pain and sore throat.   Eyes:  Negative for discharge, redness and visual disturbance.  Respiratory:  Negative for cough, chest tightness, shortness of breath and wheezing.   Cardiovascular:  Negative for chest pain, palpitations and leg swelling.  Gastrointestinal:  Negative for abdominal pain, blood in stool, diarrhea, nausea and vomiting.  Genitourinary:  Negative for difficulty urinating, dysuria, flank pain, frequency, hematuria and urgency.  Musculoskeletal:  Positive for arthralgias (right knee as per  HPI). Negative for back pain, joint swelling, myalgias and neck stiffness.  Skin:  Negative for pallor and rash.  Neurological:  Negative for dizziness, speech difficulty, weakness and headaches.  Hematological:  Negative for adenopathy. Does not bruise/bleed easily.  Psychiatric/Behavioral:  Negative for confusion and sleep disturbance. The patient is not nervous/anxious.    PE; Vitals with BMI 11/04/2020 05/03/2020 10/31/2019  Height 5\' 6"  5\' 6"  -  Weight 185 lbs 6 oz 186 lbs 10 oz 185 lbs 6 oz  BMI 29.94 30.13 -  Systolic 145 120  Diastolic 83  80 86  Pulse 74 71 73   Exam chaperoned by Emi Holes, CMA.  Gen: Alert, well appearing.  Patient is oriented to person, place, time, and situation. AFFECT: pleasant, lucid thought and speech. ENT: Ears: EACs clear, normal epithelium.  TMs with good light reflex and landmarks bilaterally.  Eyes: no injection, icteris, swelling, or exudate.  EOMI, PERRLA. Nose: no drainage or turbinate edema/swelling.  No injection or focal lesion.  Mouth: lips without lesion/swelling.  Oral mucosa pink and moist.  Dentition intact and without obvious caries or gingival swelling.  Oropharynx without erythema, exudate, or swelling.  Neck: supple/nontender.  No LAD, mass, or TM.  Carotid pulses 2+ bilaterally, without bruits. CV: RRR, no m/r/g.   LUNGS: CTA bilat, nonlabored resps, good aeration in all lung fields. ABD: soft, NT, ND, BS normal.  No hepatospenomegaly or mass.  No bruits. EXT: no clubbing, cyanosis, or edema.  Musculoskeletal: R knee w/out erythema, warmth, or swelling.  ROM intact but some pain at maximum of flexion.  +bilat joint line TTP--mild.  Mild + McMurrays.  No instability or laxity.  Otherwise no joint swelling, erythema, warmth, or tenderness.  ROM of all joints intact. Skin - no sores or suspicious lesions or rashes or color changes  Pertinent labs:  Lab Results  Component Value Date   TSH 2.85 11/24/2019   Lab Results   Component Value Date   WBC 5.9 11/24/2019   HGB 13.3 11/24/2019   HCT 40.2 11/24/2019   MCV 92.3 11/24/2019   PLT 252.0 11/24/2019   Lab Results  Component Value Date   CREATININE 0.66 11/24/2019   BUN 16 11/24/2019   NA 141 11/24/2019   K 3.9 11/24/2019   CL 101 11/24/2019   CO2 31 11/24/2019   Lab Results  Component Value Date   ALT 16 11/24/2019   AST 12 11/24/2019   ALKPHOS 74 11/24/2019   BILITOT 0.5 11/24/2019   Lab Results  Component Value Date   CHOL 213 (H) 11/24/2019   Lab Results  Component Value Date   HDL 54.70 11/24/2019   Lab Results  Component Value Date   LDLCALC 144 (H) 11/24/2019   Lab Results  Component Value Date   TRIG 73.0 11/24/2019   Lab Results  Component Value Date   CHOLHDL 4 11/24/2019   Lab Results  Component Value Date   HGBA1C 5.6 01/02/2014   ASSESSMENT AND PLAN:   1) HTN: good control on hctz 25 qd. Lytes/cr today.  2) Insomnia: cont prn use of ambien. No new rx today.  3) R knee pain: has some exam findings suggestive of bilat meniscal injury, suspect mild. Discussed trial of steroid injection in joint and she was in favor of this. Also discussed prn aleve 440mg  qd and/or otc voltaren gel.  Procedure: Therapeutic RIGHT knee injection.  The patient's clinical condition is marked by substantial pain and/or significant functional disability.  Other conservative therapy has not provided relief, is contraindicated, or not appropriate.  There is a reasonable likelihood that injection will significantly improve the patient's pain and/or functional disability. Cleaned skin with alcohol swab, used anterior approach to enter knee joint, Injected 1 ml of 40mg /ml depo medrol + 91ml 1% plain lidocaine into joint space without resistance.  No immediate complications.  Patient tolerated procedure well.  Post-injection care discussed, including 20 min of icing 1-2 times in the next 4-8 hours, frequent non weight-bearing ROM exercises  over the next few days, and general pain medication management.  4) GERD: she has breakthrough symptoms if she stops taking PPI qd.  5) Health maintenance exam: Reviewed age and gender appropriate health maintenance issues (prudent diet, regular exercise, health risks of tobacco and excessive alcohol, use of seatbelts, fire alarms in home, use of sunscreen).  Also reviewed age and gender appropriate health screening as well as vaccine recommendations. Vaccines: Flu->UTD.  Otherwise all utd. Labs: fasting HP, thyroid panel. Cervical ca screening: pap due 2025 (to be done by me and it will be her last one if normal). Breast ca screening: mammogram due->ordered. Colon ca screening: recall 2026. Osteoporosis screening: 03/2020 T score -2.3.  Plan rpt DEXA 2024  An After Visit Summary was printed and given to the patient.  FOLLOW UP:  Return in about 6 months (around 05/04/2021) for routine chronic illness f/u.  Signed:  Santiago Bumpers, MD           11/04/2020

## 2020-11-05 ENCOUNTER — Encounter: Payer: Self-pay | Admitting: Family Medicine

## 2020-11-05 LAB — T3: T3, Total: 137 ng/dL (ref 76–181)

## 2020-12-05 ENCOUNTER — Telehealth: Payer: BC Managed Care – PPO | Admitting: Nurse Practitioner

## 2020-12-05 DIAGNOSIS — U071 COVID-19: Secondary | ICD-10-CM

## 2020-12-05 MED ORDER — MOLNUPIRAVIR EUA 200MG CAPSULE: 4.0000 | ORAL_CAPSULE | Freq: Two times a day (BID) | ORAL | 0 refills | Status: AC

## 2020-12-05 NOTE — Patient Instructions (Signed)
E-Visit  for Positive Covid Test Result  We are sorry you are not feeling well. We are here to help!  You have tested positive for COVID-19, meaning that you were infected with the novel coronavirus and could give the virus to others.  It is vitally important that you stay home so you do not spread it to others.      Please keep well-hydrated and get plenty of rest. Start a saline nasal rinse to flush out your nasal passages. You can use plain Mucinex to help thin congestion. If you have a humidifier, running in the bedroom at night. I want you to start OTC vitamin D3 1000 units daily, vitamin C 1000 mg daily, and a zinc supplement. Please take prescribed medications as directed.    If PCR test positive, please see quarantine instructions below. I also want you to message me via MyChart, using the separate message I have sent you. This way you can communicate directly with me.    You were to quarantine for 5 days from onset of your symptoms.  After day 5, if you have had no fever and you are feeling better, you can end quarantine but need to mask for an additional 5 days. After day 5 if you have a fever or are having significant symptoms, please quarantine for full 10 days.   If you note any worsening of symptoms, any significant shortness of breath or any chest pain, please seek ER evaluation ASAP.  Please do not delay care!    TODAY We have prescribed you molnupiravir an antiviral that is used to help reduce the symptoms of covid.   Go to the nearest hospital ED for assessment if fever/cough/breathlessness are severe or illness seems like a threat to life.    The following symptoms may appear 2-14 days after exposure: Fever Cough Shortness of breath or difficulty breathing Chills Repeated shaking with chills Muscle pain Headache Sore throat New loss of taste or smell Fatigue Congestion or runny nose Nausea or vomiting Diarrhea  You have been enrolled in MyChart Home  Monitoring for COVID-19. Daily you will receive a questionnaire within the MyChart website. Our COVID-19 response team will be monitoring your responses daily.  You may also take acetaminophen (Tylenol) as needed for fever.  HOME CARE: Only take medications as instructed by your medical team. Drink plenty of fluids and get plenty of rest. A steam or ultrasonic humidifier can help if you have congestion.   GET HELP RIGHT AWAY IF YOU HAVE EMERGENCY WARNING SIGNS.  Call 911 or proceed to your closest emergency facility if: You develop worsening high fever. Trouble breathing Bluish lips or face Persistent pain or pressure in the chest New confusion Inability to wake or stay awake You cough up blood. Your symptoms become more severe Inability to hold down food or fluids  This list is not all possible symptoms. Contact your medical provider for any symptoms that are severe or concerning to you.    Your e-visit answers were reviewed by a board certified advanced clinical practitioner to complete your personal care plan.  Depending on the condition, your plan could have included both over the counter or prescription medications.  If there is a problem please reply once you have received a response from your provider.  Your safety is important to [place].  If you have drug allergies check your prescription carefully.    You can use MyChart to ask questions about today's visit, request a non-urgent call  back, or ask for a work or school excuse for 24 hours related to this e-Visit. If it has been greater than 24 hours you will need to follow up with your provider, or enter a new e-Visit to address those concerns. You will get an e-mail in the next two days asking about your experience.  I hope that your e-visit has been valuable and will speed your recovery. Thank you for using e-visits.

## 2020-12-05 NOTE — Progress Notes (Signed)
Virtual Visit Consent   Andrea Newton. Andrea Newton, you are scheduled for a virtual visit with a Andrea Newton provider today.     Just as with appointments in the office, your consent must be obtained to participate.  Your consent will be active for this visit and any virtual visit you may have with one of our providers in the next 365 days.     If you have a MyChart account, a copy of this consent can be sent to you electronically.  All virtual visits are billed to your insurance company just like a traditional visit in the office.    As this is a virtual visit, video technology does not allow for your provider to perform a traditional examination.  This may limit your provider's ability to fully assess your condition.  If your provider identifies any concerns that need to be evaluated in person or the need to arrange testing (such as labs, EKG, etc.), we will make arrangements to do so.     Although advances in technology are sophisticated, we cannot ensure that it will always work on either your end or our end.  If the connection with a video visit is poor, the visit may have to be switched to a telephone visit.  With either a video or telephone visit, we are not always able to ensure that we have a secure connection.     I need to obtain your verbal consent now.   Are you willing to proceed with your visit today?    Andrea Newton. Andrea Newton has provided verbal consent on 12/05/2020 for a virtual visit (video or telephone).   Andrea Rigg, NP   Date: 12/05/2020 8:58 AM   Virtual Visit via Video Note   I, Andrea Newton, connected with  Andrea Newton. Bachmeier  ([id number], Oct 26, 1955) on 12/05/20 at  8:45 AM EDT by a video-enabled telemedicine application and verified that I am speaking with the correct person using two identifiers.  Location: Patient: Virtual Visit Location Patient: Home Provider: Virtual Visit Location Provider: Home Office   I discussed the limitations of evaluation  and management by telemedicine and the availability of in person appointments. The patient expressed understanding and agreed to proceed.    History of Present Illness: Andrea Newton. [name] is a 65 y.o. who identifies as a female who was assigned female at birth, and is being seen today for COVID 48.  HPI:  Patient and her spouse went on a cruise this past week and when they returned home they were both ill. Patient currently has complaints of sore throat, headache, nasal congestion, upset stomach, post nasal drip, vocal hoarseness. Tested positive as well as husband for COVID 4 days ago.    Problems:  Patient Active Problem List   Diagnosis Date Noted   Acute maxillary sinusitis 03/29/2015   Eczema of scalp 11/30/2014   Multinodular goiter 09/29/2014   Health maintenance examination 01/02/2014   Impaired fasting glucose 01/02/2014   Borderline hyperlipidemia 01/02/2014   Vitamin D deficiency 01/02/2014   UTI (urinary tract infection) 05/21/2013    Allergies:  Allergies  Allergen Reactions   Penicillins Rash   Medications:  Current Outpatient Medications:    molnupiravir EUA (LAGEVRIO) 200 mg CAPS capsule, Take 4 capsules (800 mg total) by mouth 2 (two) times daily for 5 days., Disp: 40 capsule, Rfl: 0   Calcium Carbonate (CALCIUM 600 PO), Take 600 mg by mouth daily. Take 1-2  tablets once daily., Disp: , Rfl:  Ciclopirox 1 % shampoo, every other day., Disp: , Rfl:    ELIDEL 1 % cream, as needed., Disp: , Rfl:    Famotidine (PEPCID PO), Take by mouth as needed., Disp: , Rfl:    hydrochlorothiazide (HYDRODIURIL) 25 MG tablet, Take 1 tablet (25 mg total) by mouth daily., Disp: 90 tablet, Rfl: 3   loratadine (CLARITIN) 10 MG tablet, Take 10 mg by mouth daily., Disp: , Rfl:    Multiple Vitamins-Minerals (PRESERVISION AREDS 2+MULTI VIT PO), , Disp: , Rfl:    pantoprazole (PROTONIX) 40 MG tablet, Take 1 tablet (40 mg total) by mouth daily. (Patient taking differently: Take 40 mg by  mouth every other day.), Disp: 90 tablet, Rfl: 3   saccharomyces boulardii (FLORASTOR) 250 MG capsule, Take by mouth daily., Disp: , Rfl:    zolpidem (AMBIEN) 5 MG tablet, TAKE 1 TABLET(5 MG) BY MOUTH AT BEDTIME AS NEEDED, Disp: 90 tablet, Rfl: 1  Observations/Objective: Patient is well-developed, well-nourished in no acute distress.  Resting comfortably in a chair in her kitchen at home.  Head is normocephalic, atraumatic.  No labored breathing.  Speech is clear and coherent with logical content.  Patient is alert and oriented at baseline.    Assessment and Plan: 1. COVID-19 - molnupiravir EUA (LAGEVRIO) 200 mg CAPS capsule; Take 4 capsules (800 mg total) by mouth 2 (two) times daily for 5 days.  Dispense: 40 capsule; Refill: 0 Please keep well-hydrated and get plenty of rest. Start a saline nasal rinse to flush out your nasal passages. You can use plain Mucinex to help thin congestion. If you have a humidifier, running in the bedroom at night. I want you to start OTC vitamin D3 1000 units daily, vitamin C 1000 mg daily, and a zinc supplement. Please take prescribed medications as directed.    If PCR test positive, please see quarantine instructions below. I also want you to message me via MyChart, using the separate message I have sent you. This way you can communicate directly with me.    You were to quarantine for 5 days from onset of your symptoms.  After day 5, if you have had no fever and you are feeling better, you can end quarantine but need to mask for an additional 5 days. After day 5 if you have a fever or are having significant symptoms, please quarantine for full 10 days.   If you note any worsening of symptoms, any significant shortness of breath or any chest pain, please seek ER evaluation ASAP.  Please do not delay care!   Follow Up Instructions: I discussed the assessment and treatment plan with the patient. The patient was provided an opportunity to ask questions and  all were answered. The patient agreed with the plan and demonstrated an understanding of the instructions.  A copy of instructions were sent to the patient via MyChart unless otherwise noted below.    The patient was advised to call back or seek an in-person evaluation if the symptoms worsen or if the condition fails to improve as anticipated.  Time:  I spent 10 minutes with the patient via telehealth technology discussing the above problems/concerns.    Andrea Rigg, NP

## 2020-12-08 ENCOUNTER — Encounter: Payer: Self-pay | Admitting: Family Medicine

## 2020-12-08 ENCOUNTER — Telehealth: Payer: BC Managed Care – PPO | Admitting: Family Medicine

## 2020-12-08 ENCOUNTER — Other Ambulatory Visit: Payer: Self-pay

## 2020-12-08 VITALS — Temp 99.2°F | Wt 180.0 lb

## 2020-12-08 DIAGNOSIS — U071 COVID-19: Secondary | ICD-10-CM | POA: Diagnosis not present

## 2020-12-08 NOTE — Progress Notes (Signed)
Virtual Visit via Video Note  I connected with pt on 12/08/20 at  2:00 PM EDT by a video enabled telemedicine application and verified that I am speaking with the correct person using two identifiers.  Location patient: home, [place] Location provider:work or home office Persons participating in the virtual visit: patient, provider  I discussed the limitations of evaluation and management by telemedicine and the availability of in person appointments. The patient expressed understanding and agreed to proceed.  HPI: 65 y/o WF being seen for respiratory symptoms. Onset 6 days ago with mild sore throat.  Then started to have some fever and chills.  Headache seems most prominent symptom.  Some fatigue and decreased appetite no nasal congestion or runny nose.  Mild cough.  No shortness of breath or chest pain.  No nausea vomiting or diarrhea.  Was seen for VV 12/05/20 and reported a positive at home covid test 12/01/20.  Molnupiravir rx'd at visit 10/23. She has taken this medicine without problem and has started to feel better today.  She is a bit perplexed, though, because COVID testing x3 after her initial positive have been negative.  She has recent exposure to several people with similar illnesses, some of them COVID positive.  ROS: See pertinent positives and negatives per HPI.  Past Medical History:  Diagnosis Date   Bleeding internal hemorrhoids 2016   Borderline hypercholesterolemia 2019   10 yr Fr[facility]ham cv risk = 4.5%. 10/2020 fmhm cv risk=6%.   Constipation    Essential hypertension    GERD (gastroesophageal reflux disease)    History of adenomatous polyp of colon 04/2014   x 1: recall 5 yrs ([facility])   Infectious mononucleosis hepatitis [employer]   Multiple thyroid nodules 2010 approx; 2015   Euthyroid.  Saw ENDO (Dr. Jimmye Norman).  Repeat thyroid u/s 12/2013 showed thyromegaly with multiple nodules meeting biopsy criteria--sent pt to Dr. Elvera Lennox and plan for repeat u/s (09/2014) and  one nodule had grown so bx -->benign.  Labs 03/2014 showed elevted TPO ab, supportive of Hashimoto's thyroiditis--a reassuring finding.  Pt euthyroid. 11/2018 u/s->stable. Annual TFT's with me, 2 yr f/u Dr. Olean Ree   Osteoarthritis    knees>hips.  Takes no meds.   Osteopenia 2017; 2019; 03/2020   Stable DEXA results 2017-2019. T score -2.18 Mar 2020. Rpt 2024.   Postmenopausal status 04/2010   FSH and LH elevated, estrogen low.   Recurrent UTI     Past Surgical History:  Procedure Laterality Date   BIOPSY THYROID  11/05/14   Scant follicular epithelium: benign   Cervical cancer screening  03/11/13   No hx of abnormals.  Neg pap and Neg HR HPV testing: next pap should be after 03/11/2018 and should include HR HPV testing as well.   COLONOSCOPY W/ POLYPECTOMY  05/2011; 04/2014   IH and 'tics (Dr. Rhetta Mura w/ [facility]).  Polyp 04/2014= tubular adenoma (recall 5 yrs)     DEXA  05/2015; 07/2017; 03/18/2020   T score -2.1: osteopenia, fracture risk not high enough to recommend anything other than calcium and vit D.  June 2019: T-score -2.1. 03/2020 T score -2.3. Plan rpt 2024.   ENDOMETRIAL BIOPSY  2006   Performed b/c of abnormal uterine bleeding: normal (fragmented INACTIVE endometrium).  [facility] [place], [name].   HEMORRHOID BANDING  April and May 2016   [facility]   LASIK  2002     Current Outpatient Medications:    Calcium Carbonate (CALCIUM 600 PO), Take 600 mg  by mouth daily. Take 1-2  tablets once daily., Disp: , Rfl:    Ciclopirox 1 % shampoo, every other day., Disp: , Rfl:    ELIDEL 1 % cream, as needed., Disp: , Rfl:    Famotidine (PEPCID PO), Take by mouth as needed., Disp: , Rfl:    hydrochlorothiazide (HYDRODIURIL) 25 MG tablet, Take 1 tablet (25 mg total) by mouth daily., Disp: 90 tablet, Rfl: 3   loratadine (CLARITIN) 10 MG tablet, Take 10 mg by mouth daily., Disp: , Rfl:    molnupiravir EUA (LAGEVRIO) 200 mg CAPS capsule, Take 4 capsules (800 mg total)  by mouth 2 (two) times daily for 5 days., Disp: 40 capsule, Rfl: 0   Multiple Vitamins-Minerals (PRESERVISION AREDS 2+MULTI VIT PO), , Disp: , Rfl:    pantoprazole (PROTONIX) 40 MG tablet, Take 1 tablet (40 mg total) by mouth daily. (Patient taking differently: Take 40 mg by mouth every other day.), Disp: 90 tablet, Rfl: 3   saccharomyces boulardii (FLORASTOR) 250 MG capsule, Take by mouth daily., Disp: , Rfl:    zolpidem (AMBIEN) 5 MG tablet, TAKE 1 TABLET(5 MG) BY MOUTH AT BEDTIME AS NEEDED, Disp: 90 tablet, Rfl: 1  EXAM:  VITALS per patient if applicable:  Vitals with BMI 11/04/2020 05/03/2020 10/31/2019  Height 5\' 6"  5\' 6"  -  Weight 185 lbs 6 oz 186 lbs 10 oz 185 lbs 6 oz  BMI 29.94 30.13 -  Systolic 145 120  Diastolic 83 80 86  Pulse 74 71 73    GENERAL: alert, oriented, appears well and in no acute distress  HEENT: atraumatic, conjunttiva clear, no obvious abnormalities on inspection of external nose and ears  NECK: normal movements of the head and neck  LUNGS: on inspection no signs of respiratory distress, breathing rate appears normal, no obvious gross SOB, gasping or wheezing  CV: no obvious cyanosis  MS: moves all visible extremities without noticeable abnormality  PSYCH/NEURO: pleasant and cooperative, no obvious depression or anxiety, speech and thought processing grossly intact  LABS: none today    Chemistry      Component Value Date/Time   NA 139 11/04/2020 0945   K 4.1 11/04/2020 0945   CL 102 11/04/2020 0945   CO2 29 11/04/2020 0945   BUN 17 11/04/2020 0945   CREATININE 0.70 11/04/2020 0945   CREATININE 0.60 10/24/2012 1613      Component Value Date/Time   CALCIUM 9.3 11/04/2020 0945   ALKPHOS 73 11/04/2020 0945   AST 17 11/04/2020 0945   ALT 23 11/04/2020 0945   BILITOT 0.3 11/04/2020 0945      ASSESSMENT AND PLAN:  Discussed the following assessment and plan:  COVID-19 virus infection.  Seems to be improving the last 24 hours.  Finish  molvupiravir and take Tylenol 1000 mg 3 times daily as needed. Reassured patient that her illness course is typical/appropriate for this viral syndrome. She does not have to do any further covert testing.  I discussed the assessment and treatment plan with the patient. The patient was provided an opportunity to ask questions and all were answered. The patient agreed with the plan and demonstrated an understanding of the instructions.   F/u: if not improving in 4-5  Signed:  11/06/2020, MD           12/08/2020

## 2020-12-27 ENCOUNTER — Telehealth: Payer: BC Managed Care – PPO | Admitting: Physician Assistant

## 2020-12-27 DIAGNOSIS — B9689 Other specified bacterial agents as the cause of diseases classified elsewhere: Secondary | ICD-10-CM

## 2020-12-27 DIAGNOSIS — J019 Acute sinusitis, unspecified: Secondary | ICD-10-CM

## 2020-12-27 MED ORDER — DOXYCYCLINE HYCLATE 100 MG PO TABS: 100.0000 mg | ORAL_TABLET | Freq: Two times a day (BID) | ORAL | 0 refills | Status: DC

## 2020-12-27 NOTE — Progress Notes (Signed)

## 2021-01-03 ENCOUNTER — Other Ambulatory Visit (HOSPITAL_BASED_OUTPATIENT_CLINIC_OR_DEPARTMENT_OTHER): Payer: Self-pay | Admitting: Family Medicine

## 2021-01-03 DIAGNOSIS — Z1231 Encounter for screening mammogram for malignant neoplasm of breast: Secondary | ICD-10-CM

## 2021-01-12 ENCOUNTER — Other Ambulatory Visit: Payer: Self-pay

## 2021-01-13 ENCOUNTER — Encounter: Payer: Self-pay | Admitting: Family Medicine

## 2021-01-13 ENCOUNTER — Ambulatory Visit (HOSPITAL_BASED_OUTPATIENT_CLINIC_OR_DEPARTMENT_OTHER)
Admission: RE | Admit: 2021-01-13 | Discharge: 2021-01-13 | Disposition: A | Discharge status: Home / Self Care | Payer: BC Managed Care – PPO | Source: Ambulatory Visit | Attending: Family Medicine | Admitting: Family Medicine

## 2021-01-13 ENCOUNTER — Ambulatory Visit (INDEPENDENT_AMBULATORY_CARE_PROVIDER_SITE_OTHER): Payer: BC Managed Care – PPO | Admitting: Family Medicine

## 2021-01-13 ENCOUNTER — Other Ambulatory Visit: Payer: Self-pay

## 2021-01-13 VITALS — BP 122/78 | HR 81 | Temp 98.0°F | Ht 66.0 in | Wt 186.8 lb

## 2021-01-13 DIAGNOSIS — J209 Acute bronchitis, unspecified: Secondary | ICD-10-CM | POA: Diagnosis present

## 2021-01-13 DIAGNOSIS — J069 Acute upper respiratory infection, unspecified: Secondary | ICD-10-CM | POA: Diagnosis not present

## 2021-01-13 MED ORDER — HYDROCODONE BIT-HOMATROP MBR 5-1.5 MG/5ML PO SOLN: ORAL | 0 refills | Status: DC

## 2021-01-13 MED ORDER — PREDNISONE 20 MG PO TABS: ORAL_TABLET | ORAL | 0 refills | Status: DC

## 2021-01-13 NOTE — Progress Notes (Signed)
OFFICE VISIT  01/13/2021  CC:  Chief Complaint  Patient presents with   Cough    Pt also c/o lingering head pain, cough, cold symptoms---started in late October.  Tried rest, tylenol, nasal spray, Mucinex.  E-visit on 11/12-- prescribed Doxycycline 11/14 for sinus infection.  Cough has gotten worse and noticed blood in sputum.     HPI:    Patient is a 65 y.o. female who presents for cold like symptoms since November 12th. Andrea Newton came in today with symptoms of cough, congestion, runny nose, and pain in her ribs that has worsened this weekend. She was diagnosed with sinusitis November 12th and received a course of doxycycline. After her prescription was finished, she continued to feel the symptoms mentioned above. ROS negative for fever, abdominal pain, shortness of breath.  Past Medical History:  Diagnosis Date   Bleeding internal hemorrhoids 2016   Borderline hypercholesterolemia 2019   10 yr Fr[facility]ham cv risk = 4.5%. 10/2020 fmhm cv risk=6%.   Constipation    Essential hypertension    GERD (gastroesophageal reflux disease)    History of adenomatous polyp of colon 04/2014   x 1: recall 5 yrs ([facility])   Infectious mononucleosis hepatitis [employer]   Multiple thyroid nodules 2010 approx; 2015   Euthyroid.  Saw ENDO (Dr. Jimmye Norman).  Repeat thyroid u/s 12/2013 showed thyromegaly with multiple nodules meeting biopsy criteria--sent pt to Dr. Elvera Lennox and plan for repeat u/s (09/2014) and one nodule had grown so bx -->benign.  Labs 03/2014 showed elevted TPO ab, supportive of Hashimoto's thyroiditis--a reassuring finding.  Pt euthyroid. 11/2018 u/s->stable. Annual TFT's with me, 2 yr f/u Dr. Olean Ree   Osteoarthritis    knees>hips.  Takes no meds.   Osteopenia 2017; 2019; 03/2020   Stable DEXA results 2017-2019. T score -2.18 Mar 2020. Rpt 2024.   Postmenopausal status 04/2010   FSH and LH elevated, estrogen low.   Recurrent UTI     Past Surgical History:  Procedure Laterality Date    BIOPSY THYROID  11/05/14   Scant follicular epithelium: benign   Cervical cancer screening  03/11/13   No hx of abnormals.  Neg pap and Neg HR HPV testing: next pap should be after 03/11/2018 and should include HR HPV testing as well.   COLONOSCOPY W/ POLYPECTOMY  05/2011; 04/2014   IH and 'tics (Dr. Rhetta Mura w/ [facility]).  Polyp 04/2014= tubular adenoma (recall 5 yrs)     DEXA  05/2015; 07/2017; 03/18/2020   T score -2.1: osteopenia, fracture risk not high enough to recommend anything other than calcium and vit D.  June 2019: T-score -2.1. 03/2020 T score -2.3. Plan rpt 2024.   ENDOMETRIAL BIOPSY  2006   Performed b/c of abnormal uterine bleeding: normal (fragmented INACTIVE endometrium).  [facility] [place], [name].   HEMORRHOID BANDING  April and May 2016   [facility]   LASIK  2002    Outpatient Medications Prior to Visit  Medication Sig Dispense Refill   Ascorbic Acid (VITAMIN C) 500 MG PACK      Calcium Carbonate (CALCIUM 600 PO) Take 600 mg by mouth daily. Take 1-2  tablets once daily.     Ciclopirox 1 % shampoo every other day.     ELIDEL 1 % cream as needed.     Famotidine (PEPCID PO) Take by mouth as needed.     hydrochlorothiazide (HYDRODIURIL) 25 MG tablet Take 1 tablet (25 mg total) by mouth daily. 90 tablet 3   loratadine (  CLARITIN) 10 MG tablet Take 10 mg by mouth daily.     Multiple Vitamins-Minerals (PRESERVISION AREDS 2+MULTI VIT PO)      pantoprazole (PROTONIX) 40 MG tablet Take 1 tablet (40 mg total) by mouth daily. (Patient taking differently: Take 40 mg by mouth every other day.) 90 tablet 3   saccharomyces boulardii (FLORASTOR) 250 MG capsule Take by mouth daily.     Zinc 50 MG CAPS      zolpidem (AMBIEN) 5 MG tablet TAKE 1 TABLET(5 MG) BY MOUTH AT BEDTIME AS NEEDED 90 tablet 1   doxycycline (VIBRA-TABS) 100 MG tablet Take 1 tablet (100 mg total) by mouth 2 (two) times daily. (Patient not taking: Reported on 01/13/2021) 20 tablet 0   No  facility-administered medications prior to visit.    Allergies  Allergen Reactions   Penicillins Rash    ROS As per HPI  PE: Vitals with BMI 01/13/2021 12/08/2020 11/04/2020  Height 5\' 6"  - 5\' 6"   Weight 186 lbs 13 oz 180 lbs 185 lbs 6 oz  BMI 30.16 - 29.94  Systolic - - 145  Diastolic - - 83  Pulse - - 74   Physical Exam Constitutional:      Appearance: Normal appearance.  Cardiovascular:     Rate and Rhythm: Normal rate and regular rhythm.     Heart sounds: Normal heart sounds.  Pulmonary:     Effort: Pulmonary effort is normal.     Breath sounds: Normal breath sounds.  Neurological:     Mental Status: She is alert.     LABS:  None today  IMPRESSION AND PLAN:  Non-toxic female whose current status is unimproved from her initial diagnosis of sinusitis in November. Given the patient has mildly, persistent cold symptoms (cough, rhinorrhea) without a fever after a course of doxycycline, I suspect the patient is still in recovery from her sinus infection. Plan is to use Albuterol via inhaler or nebulizer and a 10 day course of prednisone (2 tabs po qd x 5d, then 1 tab po qd x 5d) to decrease work of breathing. Also prescribed Hycodan prn for nighttime cough. Ordering a cxr to rule out pneumonia. Will reassess need for antibiotics with x-ray result. Return if symptoms worsen.  An After Visit Summary was printed and given to the patient.  FOLLOW UP:  If symptoms worsen or persist  - MS3

## 2021-01-13 NOTE — Progress Notes (Signed)
See student note for this encounter. I have attested it. Signed:  Phil Roniesha Hollingshead, MD           01/13/2021  

## 2021-02-10 ENCOUNTER — Ambulatory Visit: Payer: BC Managed Care – PPO | Admitting: Family Medicine

## 2021-02-10 ENCOUNTER — Encounter: Payer: Self-pay | Admitting: Family Medicine

## 2021-02-10 ENCOUNTER — Other Ambulatory Visit: Payer: Self-pay

## 2021-02-10 ENCOUNTER — Ambulatory Visit (HOSPITAL_BASED_OUTPATIENT_CLINIC_OR_DEPARTMENT_OTHER)
Admission: RE | Admit: 2021-02-10 | Discharge: 2021-02-10 | Disposition: A | Discharge status: Home / Self Care | Payer: BC Managed Care – PPO | Source: Ambulatory Visit | Attending: Family Medicine | Admitting: Family Medicine

## 2021-02-10 VITALS — BP 119/76 | HR 73 | Temp 97.9°F | Ht 66.0 in | Wt 187.4 lb

## 2021-02-10 DIAGNOSIS — M25561 Pain in right knee: Secondary | ICD-10-CM | POA: Diagnosis present

## 2021-02-10 MED ORDER — TRIAMCINOLONE ACETONIDE 40 MG/ML IJ SUSP
40.0000 mg | Freq: Once | INTRAMUSCULAR | Status: AC
  Administered 2021-02-10: 09:00:00 40 mg via INTRAMUSCULAR

## 2021-02-10 NOTE — Addendum Note (Signed)
Addended by: Emi Holes D on: 02/10/2021 08:43 AM   Modules accepted: Orders

## 2021-02-10 NOTE — Progress Notes (Signed)
OFFICE VISIT  02/10/2021  CC:  Chief Complaint  Patient presents with   Knee Pain    Right; has had injection before.  Has been using Advil otc for relief. Pain got severe last week, but reoccurred 3 weeks ago.    HPI:    Patient is a 65 y.o. female who presents for knee pain.  HPI: Andrea Newton started having significant knee pain about a week ago but it had started 2 to 3 weeks ago and mild intensity.  Hurts all over than the right knee.  No preceding injury or strain.  Feels like is going to give way occasionally but no locking.  No swelling or redness.  Has tried some ice as well as ibuprofen and this helps a little bit.  No fever or malaise.  Right greater than left knee has bothered her on and off mildly over the last few years especially going downstairs.  I saw her for acute right knee pain back on 11/04/2020 and did steroid injection at that time and this helped.  Past Medical History:  Diagnosis Date   Bleeding internal hemorrhoids 2016   Borderline hypercholesterolemia 2019   10 yr Fr[facility]ham cv risk = 4.5%. 10/2020 fmhm cv risk=6%.   Constipation    Essential hypertension    GERD (gastroesophageal reflux disease)    History of adenomatous polyp of colon 04/2014   x 1: recall 5 yrs ([facility])   Infectious mononucleosis hepatitis [employer]   Multiple thyroid nodules 2010 approx; 2015   Euthyroid.  Saw ENDO (Dr. Jimmye Norman).  Repeat thyroid u/s 12/2013 showed thyromegaly with multiple nodules meeting biopsy criteria--sent pt to Dr. Elvera Lennox and plan for repeat u/s (09/2014) and one nodule had grown so bx -->benign.  Labs 03/2014 showed elevted TPO ab, supportive of Hashimoto's thyroiditis--a reassuring finding.  Pt euthyroid. 11/2018 u/s->stable. Annual TFT's with me, 2 yr f/u Dr. Olean Ree   Osteoarthritis    knees>hips.  Takes no meds.   Osteopenia 2017; 2019; 03/2020   Stable DEXA results 2017-2019. T score -2.18 Mar 2020. Rpt 2024.   Postmenopausal status 04/2010   FSH and LH  elevated, estrogen low.   Recurrent UTI     Past Surgical History:  Procedure Laterality Date   BIOPSY THYROID  11/05/14   Scant follicular epithelium: benign   Cervical cancer screening  03/11/13   No hx of abnormals.  Neg pap and Neg HR HPV testing: next pap should be after 03/11/2018 and should include HR HPV testing as well.   COLONOSCOPY W/ POLYPECTOMY  05/2011; 04/2014   IH and 'tics ([facility]/ [facility]).  Polyp 04/2014= tubular adenoma (recall 5 yrs)     DEXA  05/2015; 07/2017; 03/18/2020   T score -2.1: osteopenia, fracture risk not high enough to recommend anything other than calcium and vit D.  June 2019: T-score -2.1. 03/2020 T score -2.3. Plan rpt 2024.   ENDOMETRIAL BIOPSY  2006   Performed b/c of abnormal uterine bleeding: normal (fragmented INACTIVE endometrium).  [facility] [place], [name].   HEMORRHOID BANDING  April and May 2016   [facility]   LASIK  2002    Outpatient Medications Prior to Visit  Medication Sig Dispense Refill   Ascorbic Acid (VITAMIN C) 500 MG PACK      Calcium Carbonate (CALCIUM 600 PO) Take 600 mg by mouth daily. Take 1-2  tablets once daily.     Ciclopirox 1 % shampoo every other day.  ELIDEL 1 % cream as needed.     Famotidine (PEPCID PO) Take by mouth as needed.     hydrochlorothiazide (HYDRODIURIL) 25 MG tablet Take 1 tablet (25 mg total) by mouth daily. 90 tablet 3   loratadine (CLARITIN) 10 MG tablet Take 10 mg by mouth daily.     Multiple Vitamins-Minerals (PRESERVISION AREDS 2+MULTI VIT PO)      pantoprazole (PROTONIX) 40 MG tablet Take 1 tablet (40 mg total) by mouth daily. (Patient taking differently: Take 40 mg by mouth every other day.) 90 tablet 3   saccharomyces boulardii (FLORASTOR) 250 MG capsule Take by mouth daily.     Zinc 50 MG CAPS      zolpidem (AMBIEN) 5 MG tablet TAKE 1 TABLET(5 MG) BY MOUTH AT BEDTIME AS NEEDED 90 tablet 1   HYDROcodone bit-homatropine (HYCODAN) 5-1.5 MG/5ML syrup 1-2 tsp  po bid prn cough (Patient not taking: Reported on 02/10/2021) 120 mL 0   predniSONE (DELTASONE) 20 MG tablet 2 tabs po qd x 5d, then 1 tab po qd x 5d (Patient not taking: Reported on 02/10/2021) 15 tablet 0   No facility-administered medications prior to visit.    Allergies  Allergen Reactions   Penicillins Rash    ROS As per HPI  PE: Vitals with BMI 02/10/2021 01/13/2021 12/08/2020  Height 5\' 6"  5\' 6"  -  Weight 187 lbs 6 oz 186 lbs 13 oz 180 lbs  BMI 30.26 30.16 -  Systolic 119 122 -  Diastolic 76 78 -  Pulse 73 81 -     Physical Exam  Gen: Alert, well appearing.  Patient is oriented to person, place, time, and situation. AFFECT: pleasant, lucid thought and speech. R knee without erythema, warmth, or swelling.  Diffuse tenderness to palpation over anterior knee diffusely including Pes anserine region and, medial joint line, and over medial and lateral collateral ligament areas.  No popliteal tenderness.  Range of motion shows full extension without pain and she lacks only about 10 degrees from full flexion. No laxity with varus and valgus stress.  No instability with ACL and PCL testing  LABS:  Last CBC Lab Results  Component Value Date   WBC 4.2 11/04/2020   HGB 13.0 11/04/2020   HCT 38.8 11/04/2020   MCV 90.3 11/04/2020   MCH 30.1 10/24/2012   RDW 13.8 11/04/2020   PLT 235.0 11/04/2020   Last metabolic panel Lab Results  Component Value Date   GLUCOSE 104 (H) 11/04/2020   NA 139 11/04/2020   K 4.1 11/04/2020   CL 102 11/04/2020   CO2 29 11/04/2020   BUN 17 11/04/2020   CREATININE 0.70 11/04/2020   CALCIUM 9.3 11/04/2020   PHOS 3.4 10/24/2012   PROT 7.1 11/04/2020   ALBUMIN 4.2 11/04/2020   BILITOT 0.3 11/04/2020   ALKPHOS 73 11/04/2020   AST 17 11/04/2020   ALT 23 11/04/2020    IMPRESSION AND PLAN:  Acute on chronic right knee pain. No sign of internal derangement. Suspect osteoarthritis. Patient in favor of steroid injection today since this did  help significantly 3 months ago. Additionally will check plain films of the right knee and get her started with physical therapy.   Procedure: Therapeutic knee injection.  The patient's clinical condition is marked by substantial pain and/or significant functional disability.  Other conservative therapy has not provided relief, is contraindicated, or not appropriate.  There is a reasonable likelihood that injection will significantly improve the patient's pain and/or functional disability. Cleaned skin with  alcohol swab, used lateral approach to enter knee joint suprapatellar recess, Injected 72ml of 40mg /ml kenalog + 2  ml of 1% plain lidocaine into joint space without resistance.  No immediate complications.  Patient tolerated procedure well.  Post-injection care discussed, including 20 min of icing 1-2 times in the next 4-8 hours, frequent non weight-bearing ROM exercises over the next few days, and general pain medication management.  An After Visit Summary was printed and given to the patient.  FOLLOW UP: Return for 6 wks f/u R knee pain.  Signed:  , MD           02/10/2021

## 2021-02-11 ENCOUNTER — Ambulatory Visit: Payer: BC Managed Care – PPO | Admitting: Family Medicine

## 2021-02-21 ENCOUNTER — Ambulatory Visit (HOSPITAL_BASED_OUTPATIENT_CLINIC_OR_DEPARTMENT_OTHER)
Admission: RE | Admit: 2021-02-21 | Discharge: 2021-02-21 | Disposition: A | Discharge status: Home / Self Care | Payer: BC Managed Care – PPO | Source: Ambulatory Visit | Attending: Family Medicine | Admitting: Family Medicine

## 2021-02-21 ENCOUNTER — Other Ambulatory Visit: Payer: Self-pay

## 2021-02-21 ENCOUNTER — Encounter (HOSPITAL_BASED_OUTPATIENT_CLINIC_OR_DEPARTMENT_OTHER): Payer: Self-pay

## 2021-02-21 DIAGNOSIS — Z1231 Encounter for screening mammogram for malignant neoplasm of breast: Secondary | ICD-10-CM | POA: Diagnosis present

## 2021-03-19 ENCOUNTER — Other Ambulatory Visit: Payer: Self-pay | Admitting: Family Medicine

## 2021-03-19 DIAGNOSIS — G4700 Insomnia, unspecified: Secondary | ICD-10-CM

## 2021-03-22 NOTE — Telephone Encounter (Signed)
Requesting: zolpidem Contract: N/A UDS: N/A Last Visit: 02/10/21 Next Visit: 03/24/21  Last Refill: 07/20/20(90,1)  Please Advise. Med pending

## 2021-03-24 ENCOUNTER — Ambulatory Visit: Payer: BC Managed Care – PPO | Admitting: Family Medicine

## 2021-05-05 ENCOUNTER — Other Ambulatory Visit: Payer: Self-pay

## 2021-05-05 ENCOUNTER — Encounter: Payer: Self-pay | Admitting: Family Medicine

## 2021-05-05 ENCOUNTER — Ambulatory Visit: Payer: BC Managed Care – PPO | Admitting: Family Medicine

## 2021-05-05 VITALS — BP 129/81 | HR 68 | Temp 97.9°F | Ht 66.0 in | Wt 190.6 lb

## 2021-05-05 DIAGNOSIS — G4709 Other insomnia: Secondary | ICD-10-CM

## 2021-05-05 DIAGNOSIS — I10 Essential (primary) hypertension: Secondary | ICD-10-CM

## 2021-05-05 LAB — BASIC METABOLIC PANEL
BUN: 14 mg/dL (ref 6–23)
CO2: 32 mEq/L (ref 19–32)
Calcium: 9.2 mg/dL (ref 8.4–10.5)
Chloride: 103 mEq/L (ref 96–112)
Creatinine, Ser: 0.67 mg/dL (ref 0.40–1.20)
GFR: 92.91 mL/min (ref >60.00)
Glucose, Bld: 105 mg/dL — ABNORMAL HIGH (ref 70–99)
Potassium: 3.9 mEq/L (ref 3.5–5.1)
Sodium: 141 mEq/L (ref 135–145)

## 2021-05-05 MED ORDER — CICLOPIROX 1 % EX SHAM: MEDICATED_SHAMPOO | CUTANEOUS | 3 refills | Status: DC

## 2021-05-05 MED ORDER — ZOLPIDEM TARTRATE 5 MG PO TABS: ORAL_TABLET | ORAL | 1 refills | Status: DC

## 2021-05-05 MED ORDER — PANTOPRAZOLE SODIUM 40 MG PO TBEC: 40.0000 mg | DELAYED_RELEASE_TABLET | Freq: Every day | ORAL | 3 refills | Status: DC

## 2021-05-05 MED ORDER — HYDROCHLOROTHIAZIDE 25 MG PO TABS: 25.0000 mg | ORAL_TABLET | Freq: Every day | ORAL | 3 refills | Status: DC

## 2021-05-05 MED ORDER — NIRMATRELVIR/RITONAVIR (PAXLOVID)TABLET: 3.0000 | ORAL_TABLET | Freq: Two times a day (BID) | ORAL | 0 refills | Status: AC

## 2021-05-05 NOTE — Progress Notes (Signed)
OFFICE VISIT ? ?05/05/2021 ? ?CC:  ?Chief Complaint  ?Patient presents with  ? Hypertension  ?  Pt is not fasting  ? ? ?HPI:   ? ?Patient is a 66 y.o. female who presents for 1-month follow-up hypertension and insomnia. ?Last visit we kept her on HCTZ 25 mg a day and Ambien on a as needed basis.   ? ?INTERIM HX: ?Consuella Lose feels very well. ?She is a little frustrated with her weight but admits she does not diet or exercise as well as she should. ? ? ?She still uses Ambien intermittently for insomnia.  She gets very good therapeutic effect and has no side effects. ?PMP AWARE reviewed today: most recent rx for Remus Loffler was filled 03/23/21, # 30, rx by me. ?No red flags. ? ?She says the knee injection I did several months ago did help.  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 ? ?ROS as above, plus--> no fevers, no CP, no SOB, no wheezing, no cough, no dizziness, no HAs, no rashes, no melena/hematochezia.  No polyuria or polydipsia.  No myalgias or arthralgias.  No focal weakness, paresthesias, or tremors.  No acute vision or hearing abnormalities.  No dysuria or unusual/new urinary urgency or frequency.  No recent changes in lower legs. ?No n/v/d or abd pain.  No palpitations.   ? ?Past Medical History:  ?Diagnosis Date  ? Bleeding internal hemorrhoids 2016  ? Borderline hypercholesterolemia 2019  ? 10 yr [facility] cv risk = 4.5%. 10/2020 fmhm cv risk=6%.  ? Constipation   ? Essential hypertension   ? GERD (gastroesophageal reflux disease)   ? History of adenomatous polyp of colon 04/2014  ? x 1: recall 5 yrs ([facility])  ? Infectious mononucleosis hepatitis [employer]  ? Multiple thyroid nodules 2010 approx; 2015  ? Euthyroid.  Saw ENDO (Dr. Jimmye Norman).  Repeat thyroid u/s 12/2013 showed thyromegaly with multiple nodules meeting biopsy criteria--sent pt to Dr. Elvera Lennox and plan for repeat u/s (09/2014) and one nodule had grown so bx -->benign.  Labs 03/2014 showed elevted TPO ab, supportive of Hashimoto's  thyroiditis--a reassuring finding.  Pt euthyroid. 11/2018 u/s->stable. Annual TFT's with me, 2 yr f/u Dr. Olean Ree  ? Osteoarthritis   ? knees>hips.  Takes no meds.  ? Osteopenia 2017; 2019; 03/2020  ? Stable DEXA results 2017-2019. T score -2.18 Mar 2020. Rpt 2024.  ? Postmenopausal status 04/2010  ? FSH and LH elevated, estrogen low.  ? Recurrent UTI   ? ? ?Past Surgical History:  ?Procedure Laterality Date  ? BIOPSY THYROID  11/05/14  ? Scant follicular epithelium: benign  ? Cervical cancer screening  03/11/13  ? No hx of abnormals.  Neg pap and Neg HR HPV testing: next pap should be after 03/11/2018 and should include HR HPV testing as well.  ? COLONOSCOPY W/ POLYPECTOMY  05/2011; 04/2014  ? IH and 'tics (Dr. [facility]ura w/ [facility]).  Polyp 04/2014= tubular adenoma (recall 5 yrs)    ? DEXA  05/2015; 07/2017; 03/18/2020  ? T score -2.1: osteopenia, fracture risk not high enough to recommend anything other than calcium and vit D.  June 2019: T-score -2.1. 03/2020 T score -2.3. Plan rpt 2024.  ? ENDOMETRIAL BIOPSY  2006  ? Performed b/c of abnormal uterine bleeding: normal (fragmented INACTIVE endometrium).  [facility], [place].  ? HEMORRHOID BANDING  April and May 2016  ? [facility]  ? LASIK  2002  ? ? ?Outpatient Medications Prior to Visit  ?  Medication Sig Dispense Refill  ? Ascorbic Acid (VITAMIN C) 500 MG PACK     ? Calcium Carbonate (CALCIUM 600 PO) Take 600 mg by mouth daily. Take 1-2  tablets once daily.    ? ELIDEL 1 % cream as needed.    ? Famotidine (PEPCID PO) Take by mouth as needed.    ? HYDROcodone bit-homatropine (HYCODAN) 5-1.5 MG/5ML syrup 1-2 tsp po bid prn cough (Patient not taking: Reported on 02/10/2021) 120 mL 0  ? loratadine (CLARITIN) 10 MG tablet Take 10 mg by mouth daily.    ? Multiple Vitamins-Minerals (PRESERVISION AREDS 2+MULTI VIT PO)     ? saccharomyces boulardii (FLORASTOR) 250 MG capsule Take by mouth daily.    ? Zinc 50 MG CAPS     ? Ciclopirox 1 % shampoo  every other day.    ? hydrochlorothiazide (HYDRODIURIL) 25 MG tablet Take 1 tablet (25 mg total) by mouth daily. 90 tablet 3  ? pantoprazole (PROTONIX) 40 MG tablet Take 1 tablet (40 mg total) by mouth daily. (Patient taking differently: Take 40 mg by mouth every other day.) 90 tablet 3  ? zolpidem (AMBIEN) 5 MG tablet TAKE 1 TABLET(5 MG) BY MOUTH AT BEDTIME AS NEEDED 90 tablet 1  ? ?No facility-administered medications prior to visit.  ? ? ?Allergies  ?Allergen Reactions  ? Penicillins Rash  ? ? ?ROS ?As per HPI ? ?PE: ? ?  05/05/2021  ?  8:57 AM 02/10/2021  ?  8:00 AM 01/13/2021  ?  3:03 PM  ?Vitals with BMI  ?Height 5\' 6"  5\' 6"  5\' 6"   ?Weight 190 lbs 10 oz 187 lbs 6 oz 186 lbs 13 oz  ?BMI 30.78 30.26 30.16  ?Systolic 129 119  ?Diastolic 81 76 78  ?Pulse 68 73 81  ? ? ? ?Physical Exam ? ?Gen: Alert, well appearing.  Patient is oriented to person, place, time, and situation. ?AFFECT: pleasant, lucid thought and speech. ?CV: RRR, no m/r/g.   ?LUNGS: CTA bilat, nonlabored resps, good aeration in all lung fields. ?EXT: no clubbing or cyanosis.  no edema.  ? ? ?LABS:  ?Last CBC ?Lab Results  ?Component Value Date  ? WBC 4.2 11/04/2020  ? HGB 13.0 11/04/2020  ? HCT 38.8 11/04/2020  ? MCV 90.3 11/04/2020  ? MCH 30.1 10/24/2012  ? RDW 13.8 11/04/2020  ? PLT 235.0 11/04/2020  ? ?Last metabolic panel ?Lab Results  ?Component Value Date  ? GLUCOSE 104 (H) 11/04/2020  ? NA 139 11/04/2020  ? K 4.1 11/04/2020  ? CL 102 11/04/2020  ? CO2 29 11/04/2020  ? BUN 17 11/04/2020  ? CREATININE 0.70 11/04/2020  ? CALCIUM 9.3 11/04/2020  ? PHOS 3.4 10/24/2012  ? PROT 7.1 11/04/2020  ? ALBUMIN 4.2 11/04/2020  ? BILITOT 0.3 11/04/2020  ? ALKPHOS 73 11/04/2020  ? AST 17 11/04/2020  ? ALT 23 11/04/2020  ? ?Last lipids ?Lab Results  ?Component Value Date  ? CHOL 222 (H) 11/04/2020  ? HDL 52.80 11/04/2020  ? LDLCALC 152 (H) 11/04/2020  ? TRIG 86.0 11/04/2020  ? CHOLHDL 4 11/04/2020  ? ?Last hemoglobin A1c ?Lab Results  ?Component Value Date   ? HGBA1C 5.6 01/02/2014  ? ?Last thyroid functions ?Lab Results  ?Component Value Date  ? TSH 4.12 11/04/2020  ? T3TOTAL 137 11/04/2020  ? ?IMPRESSION AND PLAN: ? ?#1 hypertension, well controlled on HCTZ 25 mg a day. ?Basic metabolic panel today. ? ?#2 insomnia.  Doing well with as needed use  of Ambien. ?Prescription renewed today.   ? ?#3 history of right knee pain--improved with steroid injection several months ago plus PT for IT band syndrome. ?Knee x-rays normal. ? ?Consuella Loselaine [place] so I will see her when she gets back. ? ?An After Visit Summary was printed and given to the patient. ? ?FOLLOW UP: Return in about 6 months (around 11/05/2021) for annual CPE (fasting). ? ?Signed:  Santiago BumpersPhil Amirr Achord, MD           05/05/2021 ? ?

## 2021-05-06 ENCOUNTER — Telehealth: Payer: Self-pay

## 2021-05-06 NOTE — Telephone Encounter (Signed)
Pt was recently given refill for zolpidem on 05/05/21(90,1). Received fax from pharmacy stating pt's insurance has qty limit of 45 in 75 days so PA required. PA started, key #[id number] ?

## 2021-08-03 ENCOUNTER — Encounter: Payer: Self-pay | Admitting: Family Medicine

## 2021-08-12 ENCOUNTER — Telehealth: Payer: Self-pay

## 2021-08-12 NOTE — Telephone Encounter (Signed)
Pt is scheduled for surgical clearance on 7/14.

## 2021-08-12 NOTE — Telephone Encounter (Signed)
Type of forms received:   Presurgical Risk Assessment  Routed to:  [employer]   Patient has appt scheduled for 7/14 with Dr. Milinda Cave to complete assessment.  Paperwork received by :   Annabelle Harman   Individual made aware of 3-5 business day turn around (Y/N): N/A  Form completed and patient made aware of charges(Y/N): N/A  Faxed to :   Form location:

## 2021-08-26 ENCOUNTER — Encounter: Payer: Self-pay | Admitting: Family Medicine

## 2021-08-26 ENCOUNTER — Ambulatory Visit: Payer: BC Managed Care – PPO | Admitting: Family Medicine

## 2021-08-26 VITALS — BP 112/70 | HR 71 | Temp 98.2°F | Ht 66.0 in | Wt 190.6 lb

## 2021-08-26 DIAGNOSIS — Z01818 Encounter for other preprocedural examination: Secondary | ICD-10-CM

## 2021-08-26 DIAGNOSIS — M1711 Unilateral primary osteoarthritis, right knee: Secondary | ICD-10-CM | POA: Diagnosis not present

## 2021-08-26 DIAGNOSIS — I10 Essential (primary) hypertension: Secondary | ICD-10-CM

## 2021-08-26 NOTE — Progress Notes (Signed)
OFFICE VISIT  08/26/2021  CC:  Chief Complaint  Patient presents with   Surgical clearance   Patient is a 66 y.o. female who presents for preoperative medical clearance.  HPI: Andrea Newton is going to get a right total knee relacement by Dr. Sherlean Foot.  She feels very well.  She is happy to say that even her right knee feels well now.  About 2 weeks ago Dr. Sherlean Foot did a steroid injection in the knee and she has no pain now.  She is considering postponing the surgery.  She has no known cardiopulmonary or cerebrovascular disease. No history of acute or chronic systemic disease.  She has had well-controlled hypertension, taking HCTZ 25 mg a day.  No history of any adverse reaction to anesthesia.  Never smoker.  Currently feels well, denies any chest pain, dyspnea on exertion, palpitations, dizziness, fever, or lower extremity pain or swelling.  ROS as above, plus--> no fevers, no CP, no SOB, no wheezing, no cough, no HAs, no rashes, no melena/hematochezia.  No polyuria or polydipsia.  No myalgias or arthralgias.  No focal weakness, paresthesias, or tremors.  No acute vision or hearing abnormalities.  No dysuria or unusual/new urinary urgency or frequency.  No recent changes in lower legs. No n/v/d or abd pain.  No palpitations.     Past Medical History:  Diagnosis Date   Bleeding internal hemorrhoids 2016   Borderline hypercholesterolemia 2019   10 yr Fr[facility]ham cv risk = 4.5%. 10/2020 fmhm cv risk=6%.   Constipation    Essential hypertension    GERD (gastroesophageal reflux disease)    History of adenomatous polyp of colon 04/2014   x 1: recall 5 yrs ([facility])   Infectious mononucleosis hepatitis [employer]   Multiple thyroid nodules 2010 approx; 2015   Euthyroid.  Saw ENDO (Dr. Jimmye Norman).  Repeat thyroid u/s 12/2013 showed thyromegaly with multiple nodules meeting biopsy criteria--sent pt to Dr. Elvera Lennox and plan for repeat u/s (09/2014) and one nodule had grown so bx -->benign.  Labs  03/2014 showed elevted TPO ab, supportive of Hashimoto's thyroiditis--a reassuring finding.  Pt euthyroid. 11/2018 u/s->stable. Annual TFT's with me, 2 yr f/u Dr. Olean Ree   Osteoarthritis    knees>hips.  Takes no meds.   Osteopenia 2017; 2019; 03/2020   Stable DEXA results 2017-2019. T score -2.18 Mar 2020. Rpt 2024.   Postmenopausal status 04/2010   FSH and LH elevated, estrogen low.   Recurrent UTI     Past Surgical History:  Procedure Laterality Date   BIOPSY THYROID  11/05/14   Scant follicular epithelium: benign   Cervical cancer screening  03/11/13   No hx of abnormals.  Neg pap and Neg HR HPV testing: next pap should be after 03/11/2018 and should include HR HPV testing as well.   COLONOSCOPY W/ POLYPECTOMY  05/2011; 04/2014   IH and 'tics (Dr. [facility]ura [facility]/ [facility]).  Polyp 04/2014= tubular adenoma (recall 5 yrs)     DEXA  05/2015; 07/2017; 03/18/2020   T score -2.1: osteopenia, fracture risk not high enough to recommend anything other than calcium and vit D.  June 2019: T-score -2.1. 03/2020 T score -2.3. Plan rpt 2024.   ENDOMETRIAL BIOPSY  2006   Performed b/c of abnormal uterine bleeding: normal (fragmented INACTIVE endometrium).  [facility] [place], [name].   HEMORRHOID BANDING  April and May 2016   [facility]   LASIK  2002    Outpatient Medications Prior to Visit  Medication Sig Dispense Refill  Ascorbic Acid (VITAMIN C) 500 MG PACK      Calcium Carbonate (CALCIUM 600 PO) Take 600 mg by mouth daily. Take 1-2  tablets once daily.     Ciclopirox 1 % shampoo Apply qod 120 mL 3   ELIDEL 1 % cream as needed.     Famotidine (PEPCID PO) Take by mouth as needed.     hydrochlorothiazide (HYDRODIURIL) 25 MG tablet Take 1 tablet (25 mg total) by mouth daily. 90 tablet 3   loratadine (CLARITIN) 10 MG tablet Take 10 mg by mouth daily.     Multiple Vitamins-Minerals (PRESERVISION AREDS 2+MULTI VIT PO)      pantoprazole (PROTONIX) 40 MG tablet Take 1 tablet (40 mg  total) by mouth daily. 90 tablet 3   saccharomyces boulardii (FLORASTOR) 250 MG capsule Take by mouth daily.     Zinc 50 MG CAPS      zolpidem (AMBIEN) 5 MG tablet TAKE 1 TABLET(5 MG) BY MOUTH AT BEDTIME AS NEEDED 90 tablet 1   HYDROcodone bit-homatropine (HYCODAN) 5-1.5 MG/5ML syrup 1-2 tsp po bid prn cough (Patient not taking: Reported on 08/26/2021) 120 mL 0   No facility-administered medications prior to visit.    Allergies  Allergen Reactions   Penicillins Rash    ROS As per HPI  PE:    08/26/2021   10:14 AM 05/05/2021    8:57 AM 02/10/2021    8:00 AM  Vitals with BMI  Height 5\' 6"  5\' 6"  5\' 6"   Weight 190 lbs 10 oz 190 lbs 10 oz 187 lbs 6 oz  BMI 30.78 [id number]  Systolic [id number]  Diastolic 70 81 76  Pulse 71 68 73     Physical Exam  Gen: Alert, well appearing.  Patient is oriented to person, place, time, and situation.a AFFECT: pleasant, lucid thought and speech. [id number]: no injection, icteris, swelling, or exudate.  EOMI, PERRLA. Mouth: lips without lesion/swelling.  Oral mucosa pink and moist. Oropharynx without erythema, exudate, or swelling.  Neck - No masses or thyromegaly or limitation in range of motion. CV: RRR, no m/r/g.   LUNGS: CTA bilat, nonlabored resps, good aeration in all lung fields. ABD: soft, NT/ND EXT: no clubbing or cyanosis.  no edema.  Both knees with full range of motion.  No erythema, warmth, or effusion.  No tenderness.   LABS:  Last CBC Lab Results  Component Value Date   WBC 4.2 11/04/2020   HGB 13.0 11/04/2020   HCT 38.8 11/04/2020   MCV 90.3 11/04/2020   MCH 30.1 10/24/2012   RDW 13.8 11/04/2020   PLT 235.0 [id number]   Last metabolic panel Lab Results  Component Value Date   GLUCOSE 105 (H) 05/05/2021   NA 141 05/05/2021   K 3.9 05/05/2021   CL 103 05/05/2021   CO2 32 05/05/2021   BUN 14 05/05/2021   CREATININE 0.67 05/05/2021   CALCIUM 9.2 05/05/2021   PHOS 3.4 10/24/2012   PROT 7.1 11/04/2020   ALBUMIN  4.2 11/04/2020   BILITOT 0.3 11/04/2020   ALKPHOS 73 11/04/2020   AST 17 11/04/2020   ALT 23 11/04/2020   Last lipids Lab Results  Component Value Date   CHOL 222 (H) 11/04/2020   HDL 52.80 11/04/2020   LDLCALC 152 (H) 11/04/2020   TRIG 86.0 11/04/2020   CHOLHDL 4 11/04/2020   Last hemoglobin A1c Lab Results  Component Value Date   HGBA1C 5.6 01/02/2014   Last thyroid functions Lab Results  Component Value Date  TSH 4.12 11/04/2020   T3TOTAL 137 11/04/2020   IMPRESSION AND PLAN:  #1 hypertension, well controlled on HCTZ 25 mg a day.  #2 right knee osteoarthritis. Patient is considering postponing her knee replacement.  Steroid injection 2 weeks ago by Dr. Sherlean Foot brought complete resolution of her pain and instability.  Right knee steroid injection by me in September 2022 helped very well but when I gave her one back in December 2022 did not help.  Orthopedic surgery form states that they are going to get a preop CBC and basic metabolic panel. No labs here today. I filled out her preoperative medical clearance forms, low risk.  An After Visit Summary was printed and given to the patient.  FOLLOW UP: Return for keep appt set for 11/09/21.  Signed:  Santiago Bumpers, MD           08/26/2021

## 2021-11-09 ENCOUNTER — Ambulatory Visit (INDEPENDENT_AMBULATORY_CARE_PROVIDER_SITE_OTHER): Payer: BC Managed Care – PPO | Admitting: Family Medicine

## 2021-11-09 ENCOUNTER — Encounter: Payer: Self-pay | Admitting: Family Medicine

## 2021-11-09 VITALS — BP 131/77 | HR 83 | Temp 98.9°F | Ht 66.0 in | Wt 182.2 lb

## 2021-11-09 DIAGNOSIS — Z131 Encounter for screening for diabetes mellitus: Secondary | ICD-10-CM | POA: Diagnosis not present

## 2021-11-09 DIAGNOSIS — I10 Essential (primary) hypertension: Secondary | ICD-10-CM

## 2021-11-09 DIAGNOSIS — Z23 Encounter for immunization: Secondary | ICD-10-CM

## 2021-11-09 DIAGNOSIS — G4709 Other insomnia: Secondary | ICD-10-CM | POA: Diagnosis not present

## 2021-11-09 DIAGNOSIS — E042 Nontoxic multinodular goiter: Secondary | ICD-10-CM

## 2021-11-09 DIAGNOSIS — E663 Overweight: Secondary | ICD-10-CM

## 2021-11-09 DIAGNOSIS — Z0000 Encounter for general adult medical examination without abnormal findings: Secondary | ICD-10-CM

## 2021-11-09 DIAGNOSIS — F5104 Psychophysiologic insomnia: Secondary | ICD-10-CM | POA: Diagnosis not present

## 2021-11-09 LAB — COMPREHENSIVE METABOLIC PANEL
ALT: 18 U/L (ref 0–35)
AST: 13 U/L (ref 0–37)
Albumin: 4.3 g/dL (ref 3.5–5.2)
Alkaline Phosphatase: 69 U/L (ref 39–117)
BUN: 15 mg/dL (ref 6–23)
CO2: 32 mEq/L (ref 19–32)
Calcium: 9.5 mg/dL (ref 8.4–10.5)
Chloride: 100 mEq/L (ref 96–112)
Creatinine, Ser: 0.75 mg/dL (ref 0.40–1.20)
GFR: 84.33 mL/min (ref >60.00)
Glucose, Bld: 95 mg/dL (ref 70–99)
Potassium: 4 mEq/L (ref 3.5–5.1)
Sodium: 139 mEq/L (ref 135–145)
Total Bilirubin: 0.5 mg/dL (ref 0.2–1.2)
Total Protein: 6.9 g/dL (ref 6.0–8.3)

## 2021-11-09 LAB — LIPID PANEL
Cholesterol: 203 mg/dL — ABNORMAL HIGH (ref 0–200)
HDL: 63.4 mg/dL (ref >39.00)
LDL Cholesterol: 130 mg/dL — ABNORMAL HIGH (ref 0–99)
NonHDL: 139.6
Total CHOL/HDL Ratio: 3
Triglycerides: 49 mg/dL (ref 0.0–149.0)
VLDL: 9.8 mg/dL (ref 0.0–40.0)

## 2021-11-09 LAB — TSH: TSH: 2.87 u[IU]/mL (ref 0.35–5.50)

## 2021-11-09 LAB — CBC
HCT: 39.7 % (ref 36.0–46.0)
Hemoglobin: 13.2 g/dL (ref 12.0–15.0)
MCHC: 33.3 g/dL (ref 30.0–36.0)
MCV: 91.4 fl (ref 78.0–100.0)
Platelets: 212 10*3/uL (ref 150.0–400.0)
RBC: 4.35 Mil/uL (ref 3.87–5.11)
RDW: 13.7 % (ref 11.5–15.5)
WBC: 6.3 10*3/uL (ref 4.0–10.5)

## 2021-11-09 LAB — T4, FREE: Free T4: 0.98 ng/dL (ref 0.60–1.60)

## 2021-11-09 LAB — HEMOGLOBIN A1C: Hgb A1c MFr Bld: 5.7 % (ref 4.6–6.5)

## 2021-11-09 MED ORDER — ZOLPIDEM TARTRATE 5 MG PO TABS: ORAL_TABLET | ORAL | 1 refills | Status: DC

## 2021-11-09 NOTE — Patient Instructions (Signed)

## 2021-11-09 NOTE — Progress Notes (Signed)
Office Note 11/09/2021  CC:  Chief Complaint  Patient presents with   Annual Exam    Pt is fasting    HPI:  Patient is a 66 y.o. female who is here for annual health maintenance exam and f/u HTN and insomnia.  Andrea Newton is feeling well. She got another knee injection with orthopedics on 10/14/2021 and this helped extremely well.  She was able to cancel her knee replacement surgery. She went for a trip to [place] and walked all over without problems.  She has establish care with [facility], a [facility].  She feels like this is going well.  She has been put on phentermine, which she had done well with about a decade ago.  PMP AWARE reviewed today: most recent rx for Lorrin Mais was filled 10/10/21, # 15, rx by me. No red flags.   Past Medical History:  Diagnosis Date   Bleeding internal hemorrhoids 2016   Borderline hypercholesterolemia 2019   10 yr Framingham cv risk = 4.5%. 10/2020 fmhm cv risk=6%.   Constipation    Essential hypertension    GERD (gastroesophageal reflux disease)    History of adenomatous polyp of colon 04/2014   x 1: recall 5 yrs ([facility])   Infectious mononucleosis hepatitis college   Multiple thyroid nodules 2010 approx; 2015   Euthyroid.  Saw ENDO (Dr. Mare Ferrari).  Repeat thyroid u/s 12/2013 showed thyromegaly with multiple nodules meeting biopsy criteria--sent pt to Dr. Cruzita Lederer and plan for repeat u/s (09/2014) and one nodule had grown so bx -->benign.  Labs 03/2014 showed elevted TPO ab, supportive of Hashimoto's thyroiditis--a reassuring finding.  Pt euthyroid. 11/2018 u/s->stable. Annual TFT's with me, 2 yr f/u Dr. Garnette Scheuermann   Osteoarthritis    knees>hips.  Takes no meds.   Osteopenia 2017; 2019; 03/2020   Stable DEXA results 2017-2019. T score -2.18 Mar 2020. Rpt 2024.   Postmenopausal status 04/2010   FSH and LH elevated, estrogen low.   Recurrent UTI     Past Surgical History:  Procedure Laterality Date   BIOPSY THYROID  0/10/93    Scant follicular epithelium: benign   Cervical cancer screening  03/11/13   No hx of abnormals.  Neg pap and Neg HR HPV testing: next pap should be after 03/11/2018 and should include HR HPV testing as well.   COLONOSCOPY W/ POLYPECTOMY  05/2011; 04/2014   IH and 'tics ([facility] Quan [facility]/ [facility]).  Polyp 04/2014= tubular adenoma (recall 5 yrs)     DEXA  05/2015; 07/2017; 03/18/2020   T score -2.1: osteopenia, fracture risk not high enough to recommend anything other than calcium and vit D.  June 2019: T-score -2.1. 03/2020 T score -2.3. Plan rpt 2024.   ENDOMETRIAL BIOPSY  2006   Performed b/c of abnormal uterine bleeding: normal (fragmented INACTIVE endometrium).  [facility] [place], [place].   HEMORRHOID BANDING  April and May 2016   [facility]   LASIK  2002    Family History  Problem Relation Age of Onset   Arthritis Mother        Osteo (bilat hip replacements)   Cancer Maternal Grandmother        ovarian   Heart disease Maternal Grandmother    Stroke Maternal Grandmother     Social History   Socioeconomic History   Marital status: Married    Spouse name: Not on file   Number of children: Not on file   Years of education: Not on file  Highest education level: Master's degree (e.g., MA, MS, MEng, MEd, MSW, MBA)  Occupational History   Not on file  Tobacco Use   Smoking status: Never   Smokeless tobacco: Never  Vaping Use   Vaping Use: Never used  Substance and Sexual Activity   Alcohol use: Yes    Alcohol/week: 1.0 standard drink of alcohol    Types: 1 Glasses of wine per week    Comment: socially   Drug use: No   Sexual activity: Not on file  Other Topics Concern   Not on file  Social History Narrative   Married, 2 children (son Andrea Newton in his 72s, daughter age 71 at [employer]).   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   Native [address], still lives on her family's farm in [place], [name].   No tobacco, rare  alcohol, no drugs.  No siblings.   No exercise but "active".   Social Determinants of Health   Financial Resource Strain: Low Risk  (01/10/2021)   Overall Financial Resource Strain (CARDIA)    Difficulty of Paying Living Expenses: Not hard at all  Food Insecurity: No Food Insecurity (01/10/2021)   Hunger Vital Sign    Worried About Running Out of Food in the Last Year: Never true    Ran Out of Food in the Last Year: Never true  Transportation Needs: No Transportation Needs (01/10/2021)   PRAPARE - Administrator, Civil Service (Medical): No    Lack of Transportation (Non-Medical): No  Physical Activity: Insufficiently Active (01/10/2021)   Exercise Vital Sign    Days of Exercise per Week: 2 days    Minutes of Exercise per Session: 20 min  Stress: No Stress Concern Present (01/10/2021)   [employer] of Occupational Health - Occupational Stress Questionnaire    Feeling of Stress : Only a little  Social Connections: Socially Integrated (01/10/2021)   Social Connection and Isolation Panel [NHANES]    Frequency of Communication with Friends and Family: Three times a week    Frequency of Social Gatherings with Friends and Family: Twice a week    Attends Religious Services: More than 4 times per year    Active Member of [employer] or Organizations: Yes    Attends Banker Meetings: Not on file    Marital Status: Married  Catering manager Violence: Not on file    Outpatient Medications Prior to Visit  Medication Sig Dispense Refill   Ascorbic Acid (VITAMIN C) 500 MG PACK      Calcium Carbonate (CALCIUM 600 PO) Take 600 mg by mouth daily. Take 1-2  tablets once daily.     Ciclopirox 1 % shampoo Apply qod 120 mL 3   ELIDEL 1 % cream as needed.     Famotidine (PEPCID PO) Take by mouth as needed.     hydrochlorothiazide (HYDRODIURIL) 25 MG tablet Take 1 tablet (25 mg total) by mouth daily. 90 tablet 3   loratadine (CLARITIN) 10 MG tablet Take 10 mg by mouth  daily.     Multiple Vitamins-Minerals (PRESERVISION AREDS 2+MULTI VIT PO)      pantoprazole (PROTONIX) 40 MG tablet Take 1 tablet (40 mg total) by mouth daily. 90 tablet 3   phentermine (ADIPEX-P) 37.5 MG tablet Take 37.5 mg by mouth daily.     saccharomyces boulardii (FLORASTOR) 250 MG capsule Take by mouth daily.     Zinc 50 MG CAPS      zolpidem (AMBIEN) 5 MG tablet TAKE 1  TABLET(5 MG) BY MOUTH AT BEDTIME AS NEEDED 90 tablet 1   No facility-administered medications prior to visit.    Allergies  Allergen Reactions   Penicillins Rash    ROS Review of Systems  Constitutional:  Negative for appetite change, chills, fatigue and fever.  HENT:  Negative for congestion, dental problem, ear pain and sore throat.   Eyes:  Negative for discharge, redness and visual disturbance.  Respiratory:  Negative for cough, chest tightness, shortness of breath and wheezing.   Cardiovascular:  Negative for chest pain, palpitations and leg swelling.  Gastrointestinal:  Negative for abdominal pain, blood in stool, diarrhea, nausea and vomiting.  Genitourinary:  Negative for difficulty urinating, dysuria, flank pain, frequency, hematuria and urgency.  Musculoskeletal:  Negative for arthralgias, back pain, joint swelling, myalgias and neck stiffness.  Skin:  Negative for pallor and rash.  Neurological:  Negative for dizziness, speech difficulty, weakness and headaches.  Hematological:  Negative for adenopathy. Does not bruise/bleed easily.  Psychiatric/Behavioral:  Negative for confusion and sleep disturbance. The patient is not nervous/anxious.     PE;    11/09/2021    1:04 PM 08/26/2021   10:14 AM 05/05/2021    8:57 AM  Vitals with BMI  Height 5\' 6"  5\' 6"  5\' 6"   Weight 182 lbs 3 oz 190 lbs 10 oz 190 lbs 10 oz  BMI 29.42 30.78 30.78  Systolic 131 112  Diastolic 77 70 81  Pulse 83 71 68   Exam chaperoned by , CMA.  Gen: Alert, well appearing.  Patient is oriented to person,  place, time, and situation. AFFECT: pleasant, lucid thought and speech. ENT: Ears: EACs clear, normal epithelium.  TMs with good light reflex and landmarks bilaterally.  Eyes: no injection, icteris, swelling, or exudate.  EOMI, PERRLA. Nose: no drainage or turbinate edema/swelling.  No injection or focal lesion.  Mouth: lips without lesion/swelling.  Oral mucosa pink and moist.  Dentition intact and without obvious caries or gingival swelling.  Oropharynx without erythema, exudate, or swelling.  Neck: supple/nontender.  No LAD, mass, or TM.  Carotid pulses 2+ bilaterally, without bruits. CV: RRR, no m/r/g.   LUNGS: CTA bilat, nonlabored resps, good aeration in all lung fields. ABD: soft, NT, ND, BS normal.  No hepatospenomegaly or mass.  No bruits. EXT: no clubbing, cyanosis, or edema.  Musculoskeletal: no joint swelling, erythema, warmth, or tenderness.  ROM of all joints intact. Skin - no sores or suspicious lesions or rashes or color changes  Pertinent labs:  Lab Results  Component Value Date   TSH 4.12 11/04/2020   Lab Results  Component Value Date   WBC 4.2 11/04/2020   HGB 13.0 11/04/2020   HCT 38.8 11/04/2020   MCV 90.3 11/04/2020   PLT 235.0 11/04/2020   Lab Results  Component Value Date   CREATININE 0.67 05/05/2021   BUN 14 05/05/2021   NA 141 05/05/2021   K 3.9 05/05/2021   CL 103 05/05/2021   CO2 32 05/05/2021   Lab Results  Component Value Date   ALT 23 11/04/2020   AST 17 11/04/2020   ALKPHOS 73 11/04/2020   BILITOT 0.3 11/04/2020   Lab Results  Component Value Date   CHOL 222 (H) 11/04/2020   Lab Results  Component Value Date   HDL 52.80 11/04/2020   Lab Results  Component Value Date   LDLCALC 152 (H) 11/04/2020   Lab Results  Component Value Date   TRIG 86.0 11/04/2020   Lab  Results  Component Value Date   CHOLHDL 4 11/04/2020   Lab Results  Component Value Date   HGBA1C 5.6 01/02/2014   ASSESSMENT AND PLAN:   #1 health maintenance  exam: Reviewed age and gender appropriate health maintenance issues (prudent diet, regular exercise, health risks of tobacco and excessive alcohol, use of seatbelts, fire alarms in home, use of sunscreen).  Also reviewed age and gender appropriate health screening as well as vaccine recommendations. Vaccines: Flu->given today.  Otherwise all utd. Labs: fasting HP, thyroid panel, Hba1c (overweight, DM screening). Cervical ca screening: pap due 2025 (to be done by me and it will be her last one if normal). Breast ca screening: mammogram due 02/2022 Colon ca screening: recall 2026. Osteoporosis screening: 03/2020 T score -2.3.  Plan rpt DEXA 2024  #2 hypertension, well controlled on HCTZ 25 mg daily. Electrolytes and creatinine today.  3.  Insomnia, doing well on Ambien 5 mg nightly as needed. #90, refill x1 today. Controlled substance contract updated today.  #4 overweight. Established at [facility]urgical [facility]ariat[facility] health clinic. She is doing well with diet and exercise and she takes phentermine as well.  5 chronic pain of right knee, osteoarthritis. Great response to steroid injection at orthopedics, most recently on 10/14/2021. She was able to cancel the knee replacement surgery. She can return for repeat injection with me in the future if needed.  An After Visit Summary was printed and given to the patient.  FOLLOW UP:  Return in about 6 months (around 05/10/2022) for routine chronic illness f/u.  Signed:  Santiago Bumpers, MD           11/09/2021

## 2021-11-10 LAB — T3: T3, Total: 108 ng/dL (ref 76–181)

## 2021-11-27 ENCOUNTER — Telehealth: Payer: BC Managed Care – PPO | Admitting: Nurse Practitioner

## 2021-11-27 DIAGNOSIS — R399 Unspecified symptoms and signs involving the genitourinary system: Secondary | ICD-10-CM | POA: Diagnosis not present

## 2021-11-27 MED ORDER — SULFAMETHOXAZOLE-TRIMETHOPRIM 800-160 MG PO TABS: 1.0000 | ORAL_TABLET | Freq: Two times a day (BID) | ORAL | 0 refills | Status: AC

## 2021-11-27 NOTE — Progress Notes (Signed)

## 2021-11-27 NOTE — Progress Notes (Signed)
I have spent 5 minutes in review of e-visit questionnaire, review and updating patient chart, medical decision making and response to patient.  ° °Lakeyshia Tuckerman W Ashok Sawaya, NP ° °  °

## 2021-11-28 ENCOUNTER — Ambulatory Visit: Payer: BC Managed Care – PPO | Admitting: Family Medicine

## 2021-11-28 NOTE — Progress Notes (Deleted)
OFFICE VISIT  11/28/2021  CC: No chief complaint on file.   Patient is a 66 y.o. female who presents for concern of urinary tract infection.  HPI: ***  Past Medical History:  Diagnosis Date   Bleeding internal hemorrhoids 2016   Borderline hypercholesterolemia 2019   10 yr Framingham cv risk = 4.5%. 10/2020 fmhm cv risk=6%.   Constipation    Essential hypertension    GERD (gastroesophageal reflux disease)    History of adenomatous polyp of colon 04/2014   x 1: recall 5 yrs ([facility])   Infectious mononucleosis hepatitis college   Multiple thyroid nodules 2010 approx; 2015   Euthyroid.  Saw ENDO (Dr. Jimmye Norman).  Repeat thyroid u/s 12/2013 showed thyromegaly with multiple nodules meeting biopsy criteria--sent pt to Dr. Elvera Lennox and plan for repeat u/s (09/2014) and one nodule had grown so bx -->benign.  Labs 03/2014 showed elevted TPO ab, supportive of Hashimoto's thyroiditis--a reassuring finding.  Pt euthyroid. 11/2018 u/s->stable. Annual TFT's with me, 2 yr f/u Dr. Olean Ree   Osteoarthritis    knees>hips.  Takes no meds.   Osteopenia 2017; 2019; 03/2020   Stable DEXA results 2017-2019. T score -2.18 Mar 2020. Rpt 2024.   Postmenopausal status 04/2010   FSH and LH elevated, estrogen low.   Recurrent UTI     Past Surgical History:  Procedure Laterality Date   BIOPSY THYROID  11/05/14   Scant follicular epithelium: benign   Cervical cancer screening  03/11/13   No hx of abnormals.  Neg pap and Neg HR HPV testing: next pap should be after 03/11/2018 and should include HR HPV testing as well.   COLONOSCOPY W/ POLYPECTOMY  05/2011; 04/2014   IH and 'tics ([facility] R[facility]ura [facility]/ [facility]).  Polyp 04/2014= tubular adenoma (recall 5 yrs)     DEXA  05/2015; 07/2017; 03/18/2020   T score -2.1: osteopenia, fracture risk not high enough to recommend anything other than calcium and vit D.  June 2019: T-score -2.1. 03/2020 T score -2.3. Plan rpt 2024.   ENDOMETRIAL BIOPSY  2006   Performed  b/c of abnormal uterine bleeding: normal (fragmented INACTIVE endometrium).  [facility] [place], [name].   HEMORRHOID BANDING  April and May 2016   [facility]   LASIK  2002    Outpatient Medications Prior to Visit  Medication Sig Dispense Refill   Ascorbic Acid (VITAMIN C) 500 MG PACK      Calcium Carbonate (CALCIUM 600 PO) Take 600 mg by mouth daily. Take 1-2  tablets once daily.     Ciclopirox 1 % shampoo Apply qod 120 mL 3   ELIDEL 1 % cream as needed.     Famotidine (PEPCID PO) Take by mouth as needed.     hydrochlorothiazide (HYDRODIURIL) 25 MG tablet Take 1 tablet (25 mg total) by mouth daily. 90 tablet 3   loratadine (CLARITIN) 10 MG tablet Take 10 mg by mouth daily.     Multiple Vitamins-Minerals (PRESERVISION AREDS 2+MULTI VIT PO)      pantoprazole (PROTONIX) 40 MG tablet Take 1 tablet (40 mg total) by mouth daily. 90 tablet 3   phentermine (ADIPEX-P) 37.5 MG tablet Take 37.5 mg by mouth daily.     saccharomyces boulardii (FLORASTOR) 250 MG capsule Take by mouth daily.     sulfamethoxazole-trimethoprim (BACTRIM DS) 800-160 MG tablet Take 1 tablet by mouth 2 (two) times daily for 5 days. 10 tablet 0   Zinc 50 MG CAPS      zolpidem (  AMBIEN) 5 MG tablet TAKE 1 TABLET(5 MG) BY MOUTH AT BEDTIME AS NEEDED 90 tablet 1   No facility-administered medications prior to visit.    Allergies  Allergen Reactions   Penicillins Rash    ROS As per HPI  PE:    11/09/2021    1:04 PM 08/26/2021   10:14 AM 05/05/2021    8:57 AM  Vitals with BMI  Height 5\' 6"  5\' 6"  5\' 6"   Weight 182 lbs 3 oz 190 lbs 10 oz 190 lbs 10 oz  BMI 29.42 24.58 09.98  Systolic 338 250 539  Diastolic 77 70 81  Pulse 83 71 68    Physical Exam  ***  LABS:  Last CBC Lab Results  Component Value Date   WBC 6.3 11/09/2021   HGB 13.2 11/09/2021   HCT 39.7 11/09/2021   MCV 91.4 11/09/2021   MCH 30.1 10/24/2012   RDW 13.7 11/09/2021   PLT 212.0 76/73/4193   Last metabolic panel Lab  Results  Component Value Date   GLUCOSE 95 11/09/2021   NA 139 11/09/2021   K 4.0 11/09/2021   CL 100 11/09/2021   CO2 32 11/09/2021   BUN 15 11/09/2021   CREATININE 0.75 11/09/2021   CALCIUM 9.5 11/09/2021   PHOS 3.4 10/24/2012   PROT 6.9 11/09/2021   ALBUMIN 4.3 11/09/2021   BILITOT 0.5 11/09/2021   ALKPHOS 69 11/09/2021   AST 13 11/09/2021   ALT 18 11/09/2021   IMPRESSION AND PLAN:  No problem-specific Assessment & Plan notes found for this encounter.   An After Visit Summary was printed and given to the patient.  FOLLOW UP: No follow-ups on file.  Signed:  Crissie Sickles, MD           11/28/2021

## 2022-01-23 ENCOUNTER — Encounter: Payer: Self-pay | Admitting: Family Medicine

## 2022-01-23 ENCOUNTER — Ambulatory Visit: Payer: BC Managed Care – PPO | Admitting: Family Medicine

## 2022-01-23 VITALS — BP 134/80 | HR 87 | Temp 97.7°F | Wt 180.6 lb

## 2022-01-23 DIAGNOSIS — G4709 Other insomnia: Secondary | ICD-10-CM | POA: Diagnosis not present

## 2022-01-23 DIAGNOSIS — M25761 Osteophyte, right knee: Secondary | ICD-10-CM | POA: Diagnosis not present

## 2022-01-23 DIAGNOSIS — M25561 Pain in right knee: Secondary | ICD-10-CM

## 2022-01-23 DIAGNOSIS — M1711 Unilateral primary osteoarthritis, right knee: Secondary | ICD-10-CM | POA: Diagnosis not present

## 2022-01-23 MED ORDER — ZOLPIDEM TARTRATE 5 MG PO TABS: ORAL_TABLET | ORAL | 1 refills | Status: DC

## 2022-01-23 MED ORDER — TRIAMCINOLONE ACETONIDE 40 MG/ML IJ SUSP
40.0000 mg | Freq: Once | INTRAMUSCULAR | Status: AC
  Administered 2022-01-23: 40 mg via INTRA_ARTICULAR

## 2022-01-23 MED ORDER — TRIAMCINOLONE ACETONIDE 80 MG/ML IJ SUSP: 40.0000 mg | Freq: Once | INTRAMUSCULAR | Status: DC

## 2022-01-23 NOTE — Addendum Note (Signed)
Addended by: Filomena Jungling on: 01/23/2022 04:54 PM   Modules accepted: Orders

## 2022-01-23 NOTE — Progress Notes (Signed)
OFFICE VISIT  01/23/2022  CC:  Chief Complaint  Patient presents with   Knee Pain    Right knee but starting to have some pain in the left as well    Patient is a 66 y.o. female who presents for knee pain.  HPI: Right knee began hurting again over the last several weeks, progressive.  Feels like it is swollen little bit.  No preceding injury.  She has tried to limit her walking as result of her pain. She got steroid injection in the right knee on 10/14/21 at her orthopedist (Dr. Sherlean Foot). I last gave her a steroid injection in the right knee in December 2022.  Last couple weeks that she started to have pain in the lateral aspect of the right thigh.  Slight tingling sensation as well in the same area. No pain in the right hip or the low back.  Past Medical History:  Diagnosis Date   Bleeding internal hemorrhoids 2016   Borderline hypercholesterolemia 2019   10 yr Framingham cv risk = 4.5%. 10/2020 fmhm cv risk=6%.   Constipation    Essential hypertension    GERD (gastroesophageal reflux disease)    History of adenomatous polyp of colon 04/2014   x 1: recall 5 yrs ([facility])   Infectious mononucleosis hepatitis college   Multiple thyroid nodules 2010 approx; 2015   Euthyroid.  Saw ENDO (Dr. Jimmye Norman).  Repeat thyroid u/s 12/2013 showed thyromegaly with multiple nodules meeting biopsy criteria--sent pt to Dr. Elvera Lennox and plan for repeat u/s (09/2014) and one nodule had grown so bx -->benign.  Labs 03/2014 showed elevted TPO ab, supportive of Hashimoto's thyroiditis--a reassuring finding.  Pt euthyroid. 11/2018 u/s->stable. Annual TFT's with me, 2 yr f/u Dr. Olean Ree   Osteoarthritis    knees>hips.  Takes no meds.   Osteopenia 2017; 2019; 03/2020   Stable DEXA results 2017-2019. T score -2.18 Mar 2020. Rpt 2024.   Postmenopausal status 04/2010   FSH and LH elevated, estrogen low.   Recurrent UTI     Past Surgical History:  Procedure Laterality Date   BIOPSY THYROID  11/05/14   Scant  follicular epithelium: benign   Cervical cancer screening  03/11/13   No hx of abnormals.  Neg pap and Neg HR HPV testing: next pap should be after 03/11/2018 and should include HR HPV testing as well.   COLONOSCOPY W/ POLYPECTOMY  05/2011; 04/2014   IH and 'tics (Dr[facility] R[facility]ura [facility]/ [facility]).  Polyp 04/2014= tubular adenoma (recall 5 yrs)     DEXA  05/2015; 07/2017; 03/18/2020   T score -2.1: osteopenia, fracture risk not high enough to recommend anything other than calcium and vit D.  June 2019: T-score -2.1. 03/2020 T score -2.3. Plan rpt 2024.   ENDOMETRIAL BIOPSY  2006   Performed b/c of abnormal uterine bleeding: normal (fragmented INACTIVE endometrium).  [facility] [place], [name].   HEMORRHOID BANDING  April and May 2016   [facility]   LASIK  2002    Outpatient Medications Prior to Visit  Medication Sig Dispense Refill   Ascorbic Acid (VITAMIN C) 500 MG PACK      Calcium Carbonate (CALCIUM 600 PO) Take 600 mg by mouth daily. Take 1-2  tablets once daily.     Ciclopirox 1 % shampoo Apply qod 120 mL 3   ELIDEL 1 % cream as needed.     Famotidine (PEPCID PO) Take by mouth as needed.     hydrochlorothiazide (HYDRODIURIL) 25 MG tablet  Take 1 tablet (25 mg total) by mouth daily. 90 tablet 3   loratadine (CLARITIN) 10 MG tablet Take 10 mg by mouth daily.     Multiple Vitamins-Minerals (PRESERVISION AREDS 2+MULTI VIT PO)      pantoprazole (PROTONIX) 40 MG tablet Take 1 tablet (40 mg total) by mouth daily. 90 tablet 3   phentermine (ADIPEX-P) 37.5 MG tablet Take 37.5 mg by mouth daily.     saccharomyces boulardii (FLORASTOR) 250 MG capsule Take by mouth daily.     Zinc 50 MG CAPS      zolpidem (AMBIEN) 5 MG tablet TAKE 1 TABLET(5 MG) BY MOUTH AT BEDTIME AS NEEDED 90 tablet 1   No facility-administered medications prior to visit.    Allergies  Allergen Reactions   Penicillins Rash    ROS As per HPI  PE:    01/23/2022    4:03 PM 11/09/2021    1:04 PM  08/26/2021   10:14 AM  Vitals with BMI  Height  5\' 6"  5\' 6"   Weight 180 lbs 10 oz 182 lbs 3 oz 190 lbs 10 oz  BMI  29.42 30.78  Systolic 134 131  Diastolic 80 77 70  Pulse 87 83 71     Physical Exam  Gen: Alert, well appearing.  Patient is oriented to person, place, time, and situation. AFFECT: pleasant, lucid thought and speech. Right knee: No erythema or warmth. No effusion is noted.  She can actively flex right knee to about 90 degrees.  Passive flexion about the same.  Full extension.  Positive patellar grind.  Minimal discomfort to palpation of the medial and lateral joint lines.  McMurray's negative.  No instability.  LABS:  Last metabolic panel Lab Results  Component Value Date   GLUCOSE 95 11/09/2021   NA 139 11/09/2021   K 4.0 11/09/2021   CL 100 11/09/2021   CO2 32 11/09/2021   BUN 15 11/09/2021   CREATININE 0.75 11/09/2021   CALCIUM 9.5 11/09/2021   PHOS 3.4 10/24/2012   PROT 6.9 11/09/2021   ALBUMIN 4.3 11/09/2021   BILITOT 0.5 11/09/2021   ALKPHOS 69 11/09/2021   AST 13 11/09/2021   ALT 18 11/09/2021   IMPRESSION AND PLAN:  Primary osteoarthritis right knee. This has responded to steroid injection twice in the last year.  Most recent was a little over 3 months ago. She desires another steroid injection today.  Ultrasound-guided injection is preferred based on studies that show increased duration, increased effect, greater accuracy, decreased procedural pain, increased response rate, and decreased cost with ultrasound-guided versus blind injection. Procedure: Real-time ultrasound guided injection of right knee. Device: GE 11/11/2021 informed consent obtained.  Timeout conducted.  No overlying erythema, induration, or other signs of local infection. After sterile prep with Betadine, injected 1 cc of 1% lidocaine without epinephrine followed by a mixture of 3 cc of 1% lidocaine +40 mg Kenalog using a lateral approach into suprapatellar pouch. Patient  tolerated the procedure well.  No immediate complications.  Post-injection care discussed. Advised to call if fever/chills, erythema, drainage, or persistent bleeding. Impression: Technically successful ultrasound-guided injection.  An After Visit Summary was printed and given to the patient.  FOLLOW UP: Return if symptoms worsen or fail to improve.  Signed:  11/11/2021, MD           01/23/2022

## 2022-05-10 ENCOUNTER — Ambulatory Visit: Payer: BC Managed Care – PPO | Admitting: Family Medicine

## 2022-05-10 ENCOUNTER — Encounter: Payer: Self-pay | Admitting: Family Medicine

## 2022-05-10 VITALS — BP 129/85 | HR 86 | Temp 98.6°F | Ht 66.0 in | Wt 176.6 lb

## 2022-05-10 DIAGNOSIS — J209 Acute bronchitis, unspecified: Secondary | ICD-10-CM

## 2022-05-10 DIAGNOSIS — J069 Acute upper respiratory infection, unspecified: Secondary | ICD-10-CM | POA: Diagnosis not present

## 2022-05-10 DIAGNOSIS — M1711 Unilateral primary osteoarthritis, right knee: Secondary | ICD-10-CM

## 2022-05-10 DIAGNOSIS — F5101 Primary insomnia: Secondary | ICD-10-CM

## 2022-05-10 DIAGNOSIS — I10 Essential (primary) hypertension: Secondary | ICD-10-CM

## 2022-05-10 DIAGNOSIS — M25561 Pain in right knee: Secondary | ICD-10-CM

## 2022-05-10 LAB — BASIC METABOLIC PANEL
BUN: 20 mg/dL (ref 6–23)
CO2: 31 mEq/L (ref 19–32)
Calcium: 9.4 mg/dL (ref 8.4–10.5)
Chloride: 102 mEq/L (ref 96–112)
Creatinine, Ser: 0.74 mg/dL (ref 0.40–1.20)
GFR: 85.4 mL/min (ref >60.00)
Glucose, Bld: 94 mg/dL (ref 70–99)
Potassium: 3.6 mEq/L (ref 3.5–5.1)
Sodium: 140 mEq/L (ref 135–145)

## 2022-05-10 MED ORDER — CICLOPIROX 1 % EX SHAM: MEDICATED_SHAMPOO | CUTANEOUS | 3 refills | Status: AC

## 2022-05-10 MED ORDER — TRIAMCINOLONE ACETONIDE 40 MG/ML IJ SUSP
40.0000 mg | Freq: Once | INTRAMUSCULAR | Status: AC
  Administered 2022-05-10: 40 mg via INTRA_ARTICULAR

## 2022-05-10 MED ORDER — PANTOPRAZOLE SODIUM 40 MG PO TBEC: 40.0000 mg | DELAYED_RELEASE_TABLET | Freq: Every day | ORAL | 3 refills | Status: DC

## 2022-05-10 MED ORDER — ALBUTEROL SULFATE HFA 108 (90 BASE) MCG/ACT IN AERS: 2.0000 | INHALATION_SPRAY | Freq: Four times a day (QID) | RESPIRATORY_TRACT | 0 refills | Status: DC | PRN

## 2022-05-10 MED ORDER — HYDROCHLOROTHIAZIDE 25 MG PO TABS: 25.0000 mg | ORAL_TABLET | Freq: Every day | ORAL | 3 refills | Status: DC

## 2022-05-10 NOTE — Progress Notes (Signed)
OFFICE VISIT  05/15/2022  CC:  Chief Complaint  Patient presents with   Medical Management of Chronic Issues    Pt is fasting    Patient is a 67 y.o. female who presents for 16-month follow-up hypertension and insomnia, discuss right knee pain and recent cough. A/P as of last visit: "#1 health maintenance exam: Reviewed age and gender appropriate health maintenance issues (prudent diet, regular exercise, health risks of tobacco and excessive alcohol, use of seatbelts, fire alarms in home, use of sunscreen).  Also reviewed age and gender appropriate health screening as well as vaccine recommendations. Vaccines: Flu->given today.  Otherwise all utd. Labs: fasting HP, thyroid panel, Hba1c (overweight, DM screening). Cervical ca screening: pap due 2025 (to be done by me and it will be her last one if normal). Breast ca screening: mammogram due 02/2022 Colon ca screening: recall 2026. Osteoporosis screening: 03/2020 T score -2.3.  Plan rpt DEXA 2024   #2 hypertension, well controlled on HCTZ 25 mg daily. Electrolytes and creatinine today.   3.  Insomnia, doing well on Ambien 5 mg nightly as needed. #90, refill x1 today. Controlled substance contract updated today.   #4 overweight. Established at nonsurgical bariatric health clinic. She is doing well with diet and exercise and she takes phentermine as well.   5 chronic pain of right knee, osteoarthritis. Great response to steroid injection at orthopedics, most recently on 10/14/2021. She was able to cancel the knee replacement surgery. She can return for repeat injection with me in the future if needed."  INTERIM HX: Describes onset of nasal congestion and postnasal drip with cough about 6 weeks ago.  She states her breathing feels tight in her chest. Additionally, has had some constipation lately as well as repetitive heavy lifting and lately she has felt a protrusion in the vaginal area.  She requests right knee steroid injection.  She  has osteoarthritis that has responded well to steroid injection in the past (sept and Dec 2023).  He has been hurting more lately and swelling due to increased activity.   PMP AWARE reviewed today: most recent rx for Ambien was filled 04/20/2022, # 15, rx by me.  Prior to that most recent prescription was 01/27/2022, #15, prescription by me.  She gets phentermine prescribed through her weight loss clinic. No red flags.  Past Medical History:  Diagnosis Date   Bleeding internal hemorrhoids 2016   Borderline hypercholesterolemia 2019   10 yr Fr[facility] cv risk = 4.5%. 10/2020 fmhm cv risk=6%.   Constipation    Essential hypertension    GERD (gastroesophageal reflux disease)    History of adenomatous polyp of colon 04/2014   x 1: recall 5 yrs ([facility])   Infectious mononucleosis hepatitis [facility]   Multiple thyroid nodules 2010 approx; 2015   Euthyroid.  Saw ENDO (Dr. Mare Ferrari).  Repeat thyroid u/s 12/2013 showed thyromegaly with multiple nodules meeting biopsy criteria--sent pt to Dr. Cruzita Lederer and plan for repeat u/s (09/2014) and one nodule had grown so bx -->benign.  Labs 03/2014 showed elevted TPO ab, supportive of Hashimoto's thyroiditis--a reassuring finding.  Pt euthyroid. 11/2018 u/s->stable. Annual TFT's with me, 2 yr f/u Dr. Garnette Scheuermann   Osteoarthritis    knees>hips.  Takes no meds.   Osteopenia 2017; 2019; 03/2020   Stable DEXA results 2017-2019. T score -2.18 Mar 2020. Rpt 2024.   Postmenopausal status 04/2010   FSH and LH elevated, estrogen low.   Recurrent UTI     Past Surgical History:  Procedure  Laterality Date   BIOPSY THYROID  [id number]   Scant follicular epithelium: benign   Cervical cancer screening  03/11/13   No hx of abnormals.  Neg pap and Neg HR HPV testing: next pap should be after 03/11/2018 and should include HR HPV testing as well.   COLONOSCOPY W/ POLYPECTOMY  05/2011; 04/2014   IH and 'tics (Dr. Erlene Quan w/ [facility]).  Polyp 04/2014= tubular adenoma  (recall 5 yrs)     DEXA  05/2015; 07/2017; 03/18/2020   T score -2.1: osteopenia, fracture risk not high enough to recommend anything other than calcium and vit D.  June 2019: T-score -2.1. 03/2020 T score -2.3. Plan rpt 2024.   ENDOMETRIAL BIOPSY  2006   Performed b/c of abnormal uterine bleeding: normal (fragmented INACTIVE endometrium).  [facility] [place], [place].   HEMORRHOID BANDING  April and May 2016   [facility]   LASIK  2002    Outpatient Medications Prior to Visit  Medication Sig Dispense Refill   Ascorbic Acid (VITAMIN C) 500 MG PACK      Calcium Carbonate (CALCIUM 600 PO) Take 600 mg by mouth daily. Take 1-2  tablets once daily.     ELIDEL 1 % cream as needed.     Famotidine (PEPCID PO) Take by mouth as needed.     loratadine (CLARITIN) 10 MG tablet Take 10 mg by mouth daily.     Multiple Vitamins-Minerals (PRESERVISION AREDS 2+MULTI VIT PO)      phentermine (ADIPEX-P) 37.5 MG tablet Take 37.5 mg by mouth daily.     saccharomyces boulardii (FLORASTOR) 250 MG capsule Take by mouth daily.     topiramate (TOPAMAX) 25 MG tablet Take two tablets (50mg ) by mouth every evening for two weeks, then increase to three tablets (75mg ) by mouth every evening .     zolpidem (AMBIEN) 5 MG tablet TAKE 1 TABLET(5 MG) BY MOUTH AT BEDTIME AS NEEDED 90 tablet 1   Ciclopirox 1 % shampoo Apply qod 120 mL 3   hydrochlorothiazide (HYDRODIURIL) 25 MG tablet Take 1 tablet (25 mg total) by mouth daily. 90 tablet 3   pantoprazole (PROTONIX) 40 MG tablet Take 1 tablet (40 mg total) by mouth daily. 90 tablet 3   Zinc 50 MG CAPS  (Patient not taking: Reported on 05/10/2022)     No facility-administered medications prior to visit.    Allergies  Allergen Reactions   Penicillins Rash    Review of Systems As per HPI  PE:    05/10/2022    1:09 PM 01/23/2022    4:03 PM 11/09/2021    1:04 PM  Vitals with BMI  Height 5\' 6"   5\' 6"   Weight 176 lbs 10 oz 180 lbs 10 oz 182 lbs 3 oz  BMI [id number]   [id number]  Systolic Q000111Q Q000111Q [id number]  Diastolic 85 80 77  Pulse 86 87 83     Physical Exam  Gen: Alert, well appearing.  Patient is oriented to person, place, time, and situation. AFFECT: pleasant, lucid thought and speech. ENT: Ears: EACs clear, normal epithelium.  TMs with good light reflex and landmarks bilaterally.  Eyes: no injection, icteris, swelling, or exudate.  EOMI, PERRLA. Nose: no drainage or turbinate edema/swelling.  No injection or focal lesion.  Mouth: lips without lesion/swelling.  Oral mucosa pink and moist.  Dentition intact and without obvious caries or gingival swelling.  Oropharynx without erythema, exudate, or swelling.  Neck: supple/nontender.  No LAD, mass, or TM.  Carotid  pulses 2+ bilaterally, without bruits. CV: RRR, no m/r/g.   LUNGS: CTA bilat, nonlabored resps, good aeration in all lung fields. ABD: soft, NT, ND, BS normal.  No hepatospenomegaly or mass.  No bruits. EXT: no clubbing, cyanosis, or edema.  Musculoskeletal: Right knee with intact passive range of motion and without instability.  She has no erythema or warmth..  She has mild peripatellar tenderness diffusely.  She does have mild effusion.   Otherwise, no joint swelling, erythema, warmth, or tenderness.  ROM of all joints intact. Skin - no sores or suspicious lesions or rashes or color changes   LABS:  Last CBC Lab Results  Component Value Date   WBC 6.3 11/09/2021   HGB 13.2 11/09/2021   HCT 39.7 11/09/2021   MCV 91.4 11/09/2021   MCH 30.1 10/24/2012   RDW 13.7 11/09/2021   PLT 212.0 [id number]   Last metabolic panel Lab Results  Component Value Date   GLUCOSE 94 05/10/2022   NA 140 05/10/2022   K 3.6 05/10/2022   CL 102 05/10/2022   CO2 31 05/10/2022   BUN 20 05/10/2022   CREATININE 0.74 05/10/2022   CALCIUM 9.4 05/10/2022   PHOS 3.4 10/24/2012   PROT 6.9 11/09/2021   ALBUMIN 4.3 11/09/2021   BILITOT 0.5 11/09/2021   ALKPHOS 69 11/09/2021   AST 13 11/09/2021   ALT 18 11/09/2021    Last lipids Lab Results  Component Value Date   CHOL 203 (H) 11/09/2021   HDL 63.40 11/09/2021   LDLCALC 130 (H) 11/09/2021   TRIG 49.0 11/09/2021   CHOLHDL 3 11/09/2021   Last hemoglobin A1c Lab Results  Component Value Date   HGBA1C 5.7 11/09/2021   Lab Results  Component Value Date   TSH 2.87 11/09/2021   T3TOTAL 108 11/09/2021   Last vitamin D Lab Results  Component Value Date   VD25OH 42.31 05/14/2015   IMPRESSION AND PLAN:  #1 prolonged URI with cough.  She has some reactive airway disease symptoms. Albuterol inhaler prescribed today.  No prednisone at this time.  2.  Right knee osteoarthritis. Most recent injection was December 2023 and it helped very well. Proceed with steroid injection today. Ultrasound-guided injection is preferred based on studies that show increased duration, increased effect, greater accuracy, decreased procedural pain, increased response rate, and decreased cost with ultrasound-guided versus blind injection. Procedure: Real-time ultrasound guided injection of right knee suprapatellar recess. Device: [employer] informed consent obtained.  Timeout conducted.  No overlying erythema, induration, or other signs of local infection. After sterile prep with Betadine, injected 3 cc of 2% lidocaine without epinephrine followed by a combination of 40 cc Kenalog and 3 cc of 2% plain lidocaine . patient tolerated the procedure well.  No immediate complications.  Post-injection care discussed. Advised to call if fever/chills, erythema, drainage, or persistent bleeding.  Impression: Technically successful ultrasound-guided injection.  #3 vaginal protrusion. Suspect cystocele, likely secondary to increased coughing, constipation, and heavy lifting lately. Not examined today.  She will return for pelvic exam.  4.  Hypertension, well controlled on HCTZ 25 mg daily.. Electrolytes and creatinine today.   5. Insomnia, doing well on Ambien 5 mg  nightly as needed.  An After Visit Summary was printed and given to the patient.  FOLLOW UP: Return for appt at your convenience to address pelvic concern/pelvic exam.  Signed:  Crissie Sickles, MD           05/15/2022

## 2022-06-05 ENCOUNTER — Other Ambulatory Visit (HOSPITAL_BASED_OUTPATIENT_CLINIC_OR_DEPARTMENT_OTHER): Payer: Self-pay | Admitting: Family Medicine

## 2022-06-05 DIAGNOSIS — Z1231 Encounter for screening mammogram for malignant neoplasm of breast: Secondary | ICD-10-CM

## 2022-06-13 ENCOUNTER — Ambulatory Visit (HOSPITAL_BASED_OUTPATIENT_CLINIC_OR_DEPARTMENT_OTHER)
Admission: RE | Admit: 2022-06-13 | Discharge: 2022-06-13 | Disposition: A | Discharge status: Home / Self Care | Payer: BC Managed Care – PPO | Source: Ambulatory Visit | Attending: Family Medicine | Admitting: Family Medicine

## 2022-06-13 ENCOUNTER — Encounter (HOSPITAL_BASED_OUTPATIENT_CLINIC_OR_DEPARTMENT_OTHER): Payer: Self-pay

## 2022-06-13 DIAGNOSIS — Z1231 Encounter for screening mammogram for malignant neoplasm of breast: Secondary | ICD-10-CM | POA: Insufficient documentation

## 2022-08-10 ENCOUNTER — Encounter: Payer: Self-pay | Admitting: Family Medicine

## 2022-08-10 ENCOUNTER — Ambulatory Visit: Payer: BC Managed Care – PPO | Admitting: Family Medicine

## 2022-08-10 VITALS — BP 120/80 | HR 105 | Wt 176.2 lb

## 2022-08-10 DIAGNOSIS — M25561 Pain in right knee: Secondary | ICD-10-CM | POA: Diagnosis not present

## 2022-08-10 DIAGNOSIS — M1711 Unilateral primary osteoarthritis, right knee: Secondary | ICD-10-CM | POA: Diagnosis not present

## 2022-08-10 DIAGNOSIS — G8929 Other chronic pain: Secondary | ICD-10-CM | POA: Diagnosis not present

## 2022-08-10 MED ORDER — TRIAMCINOLONE ACETONIDE 40 MG/ML IJ SUSP
40.0000 mg | Freq: Once | INTRAMUSCULAR | Status: AC
  Administered 2022-08-10: 40 mg via INTRA_ARTICULAR

## 2022-08-10 NOTE — Addendum Note (Signed)
Addended by: Emi Holes D on: 08/10/2022 03:37 PM   Modules accepted: Orders

## 2022-08-10 NOTE — Progress Notes (Signed)
OFFICE VISIT  08/10/2022  CC:  Chief Complaint  Patient presents with   Knee Pain    Follow up on right knee pain. She would like an injection.   Patient is a 67 y.o. female who presents for right knee pain.  HPI: Chronic right knee pain, progressively worsening over the last several weeks. She responds well to corticosteroid injections.  Most recent 3 months ago. She has not noted any swelling or redness of the knee.  She does note most of her pain when she is going up and down stairs or up and down inclines.  No falls, no trauma. Ibuprofen as needed minimally helpful.  Past Medical History:  Diagnosis Date   Bleeding internal hemorrhoids 2016   Borderline hypercholesterolemia 2019   10 yr [facility] cv risk = 4.5%. 10/2020 fmhm cv risk=6%.   Constipation    Essential hypertension    GERD (gastroesophageal reflux disease)    History of adenomatous polyp of colon 04/2014   x 1: recall 5 yrs ([facility])   Infectious mononucleosis hepatitis college   Multiple thyroid nodules 2010 approx; 2015   Euthyroid.  Saw ENDO (Dr. Jimmye Norman).  Repeat thyroid u/s 12/2013 showed thyromegaly with multiple nodules meeting biopsy criteria--sent pt to Dr. Elvera Lennox and plan for repeat u/s (09/2014) and one nodule had grown so bx -->benign.  Labs 03/2014 showed elevted TPO ab, supportive of Hashimoto's thyroiditis--a reassuring finding.  Pt euthyroid. 11/2018 u/s->stable. Annual TFT's with me, 2 yr f/u Dr. Olean Ree   Osteoarthritis    knees>hips.  Takes no meds.   Osteopenia 2017; 2019; 03/2020   Stable DEXA results 2017-2019. T score -2.18 Mar 2020. Rpt 2024.   Postmenopausal status 04/2010   FSH and LH elevated, estrogen low.   Recurrent UTI     Past Surgical History:  Procedure Laterality Date   BIOPSY THYROID  11/05/14   Scant follicular epithelium: benign   Cervical cancer screening  03/11/13   No hx of abnormals.  Neg pap and Neg HR HPV testing: next pap should be after 03/11/2018 and should  include HR HPV testing as well.   COLONOSCOPY W/ POLYPECTOMY  05/2011; 04/2014   IH and 'tics (Dr. Rhetta Mura w/ [facility]).  Polyp 04/2014= tubular adenoma (recall 5 yrs)     DEXA  05/2015; 07/2017; 03/18/2020   T score -2.1: osteopenia, fracture risk not high enough to recommend anything other than calcium and vit D.  June 2019: T-score -2.1. 03/2020 T score -2.3. Plan rpt 2024.   ENDOMETRIAL BIOPSY  2006   Performed b/c of abnormal uterine bleeding: normal (fragmented INACTIVE endometrium).  [facility] [place], [name].   HEMORRHOID BANDING  April and May 2016   [facility]   LASIK  2002    Outpatient Medications Prior to Visit  Medication Sig Dispense Refill   albuterol (VENTOLIN HFA) 108 (90 Base) MCG/ACT inhaler Inhale 2 puffs into the lungs every 6 (six) hours as needed for wheezing or shortness of breath. 8 g 0   Ascorbic Acid (VITAMIN C) 500 MG PACK      Calcium Carbonate (CALCIUM 600 PO) Take 600 mg by mouth daily. Take 1-2  tablets once daily.     Ciclopirox 1 % shampoo Apply qod 120 mL 3   ELIDEL 1 % cream as needed.     Famotidine (PEPCID PO) Take by mouth as needed.     hydrochlorothiazide (HYDRODIURIL) 25 MG tablet Take 1 tablet (25 mg total) by mouth daily.  90 tablet 3   loratadine (CLARITIN) 10 MG tablet Take 10 mg by mouth daily.     Multiple Vitamins-Minerals (PRESERVISION AREDS 2+MULTI VIT PO)      pantoprazole (PROTONIX) 40 MG tablet Take 1 tablet (40 mg total) by mouth daily. 90 tablet 3   phentermine (ADIPEX-P) 37.5 MG tablet Take 37.5 mg by mouth daily.     saccharomyces boulardii (FLORASTOR) 250 MG capsule Take by mouth daily.     topiramate (TOPAMAX) 25 MG tablet Take two tablets (50mg ) by mouth every evening for two weeks, then increase to three tablets (75mg ) by mouth every evening .     zolpidem (AMBIEN) 5 MG tablet TAKE 1 TABLET(5 MG) BY MOUTH AT BEDTIME AS NEEDED 90 tablet 1   No facility-administered medications prior to visit.     Allergies  Allergen Reactions   Penicillins Rash    Review of Systems  As per HPI  PE:    08/10/2022    1:32 PM 08/10/2022    1:22 PM 05/10/2022    1:09 PM  Vitals with BMI  Height   5\' 6"   Weight  176 lbs 3 oz 176 lbs 10 oz  BMI   28.52  Systolic 120 147 161  Diastolic 80 92 85  Pulse  105 86     Physical Exam  Gen: Alert, well appearing.  Patient is oriented to person, place, time, and situation. Right knee without erythema or warmth.  No obvious effusion.  She has some mild tenderness in the peripatellar areas.  No definite joint line tenderness.  No crepitus or instability.  Good proximal and distal leg strength.  LABS:  Last CBC Lab Results  Component Value Date   WBC 6.3 11/09/2021   HGB 13.2 11/09/2021   HCT 39.7 11/09/2021   MCV 91.4 11/09/2021   MCH 30.1 10/24/2012   RDW 13.7 11/09/2021   PLT 212.0 11/09/2021   Last metabolic panel Lab Results  Component Value Date   GLUCOSE 94 05/10/2022   NA 140 05/10/2022   K 3.6 05/10/2022   CL 102 05/10/2022   CO2 31 05/10/2022   BUN 20 05/10/2022   CREATININE 0.74 05/10/2022   CALCIUM 9.4 05/10/2022   PHOS 3.4 10/24/2012   PROT 6.9 11/09/2021   ALBUMIN 4.3 11/09/2021   BILITOT 0.5 11/09/2021   ALKPHOS 69 11/09/2021   AST 13 11/09/2021   ALT 18 11/09/2021   IMPRESSION AND PLAN:  Acute on chronic right knee pain. Known primary osteoarthritis. She would like a steroid injection today.  Each time she has received a steroid injection she has gotten very good resolution of pain and movement in function and quality of life.  Ultrasound-guided injection is preferred based on studies that show increased duration, increased effect, greater accuracy, decreased procedural pain, increased response rate, and decreased cost with ultrasound-guided versus blind injection. Procedure: Real-time ultrasound guided injection of right knee suprapatellar pouch. Device: [employer] informed consent obtained.   Timeout conducted.  No overlying erythema, induration, or other signs of local infection. After sterile prep with Betadine, injected 3 mL of 1% plain lidocaine and followed this with mixture of 40 mg Kenalog and 2 mL of 1% plain lidocaine. Injectate seen filling suprapatellar space. Patient tolerated the procedure well.  No immediate complications.  Post-injection care discussed. Advised to call if fever/chills, erythema, drainage, or persistent bleeding.  Impression: Technically successful ultrasound-guided injection.  An After Visit Summary was printed and given to the patient.  FOLLOW  UP: Return if symptoms worsen or fail to improve.  Signed:  Santiago Bumpers, MD           08/10/2022

## 2022-08-23 ENCOUNTER — Other Ambulatory Visit: Payer: Self-pay | Admitting: Family Medicine

## 2022-08-23 NOTE — Telephone Encounter (Signed)
Requesting: zolpidem (AMBIEN) 5 MG tablet  Contract: N/A UDS: N/A Last Visit: 07/21/22 Next Visit: 11/06/22 Last Refill: 01/23/22 (90,1)  Please Advise. Med pending

## 2022-09-14 IMAGING — MG MM DIGITAL SCREENING BILAT W/ TOMO AND CAD
8 series · 8 of 24 positions shown · non-contrast
Comparison: Previous exam(s).

CLINICAL DATA: Screening.

EXAM:
DIGITAL SCREENING BILATERAL MAMMOGRAM WITH TOMOSYNTHESIS AND CAD
TECHNIQUE: Bilateral screening digital craniocaudal and mediolateral oblique
mammograms were obtained. Bilateral screening digital breast
tomosynthesis was performed. The images were evaluated with
computer-aided detection.

[R MLO synth-2D]
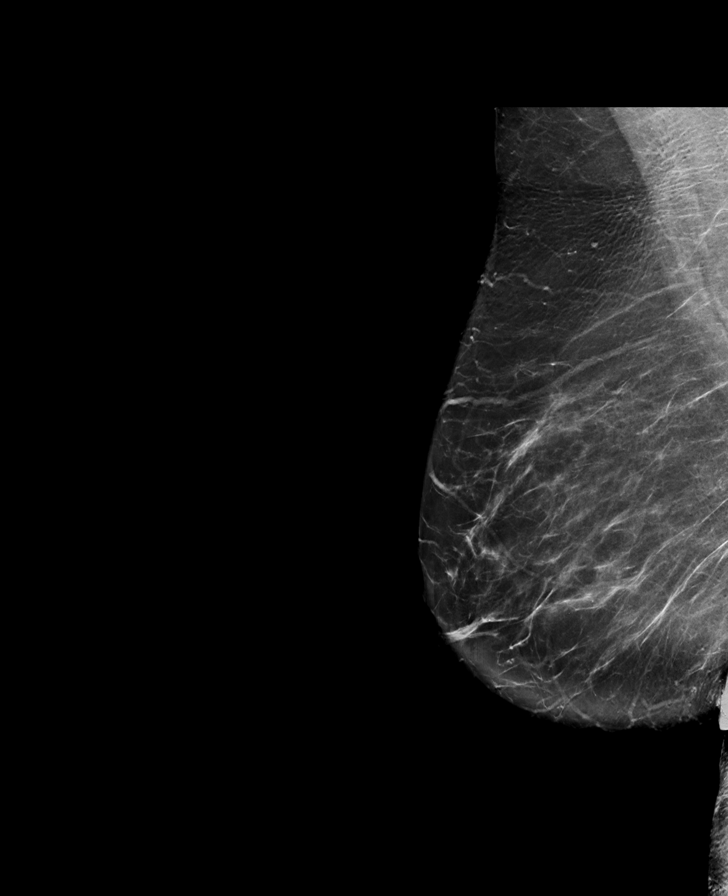

[R CC synth-2D]
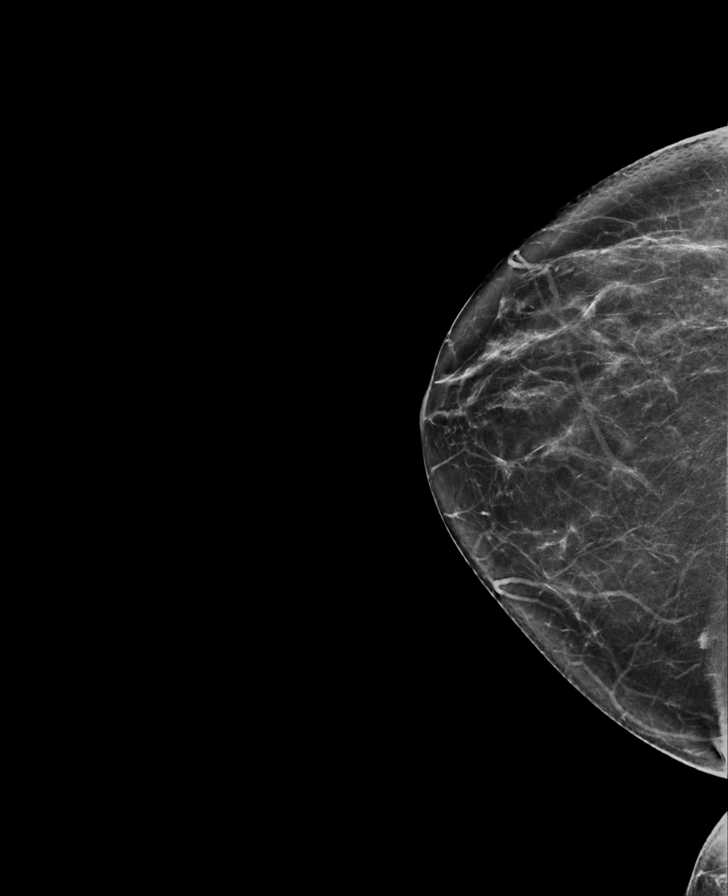

[L MLO synth-2D]
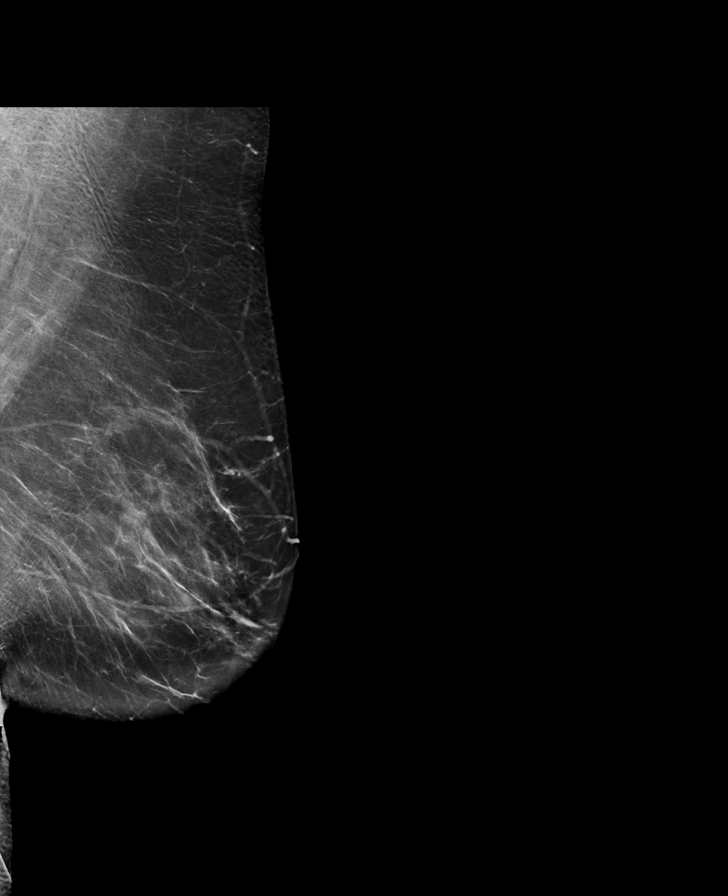

[L CC synth-2D]
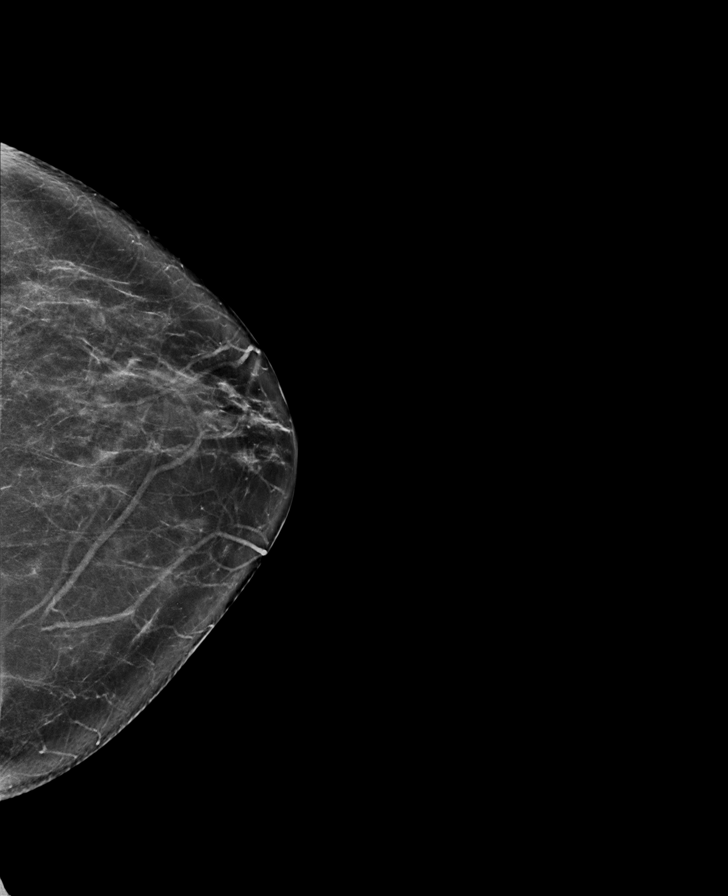

[L CC tomo · tomo slice 35/70.0]
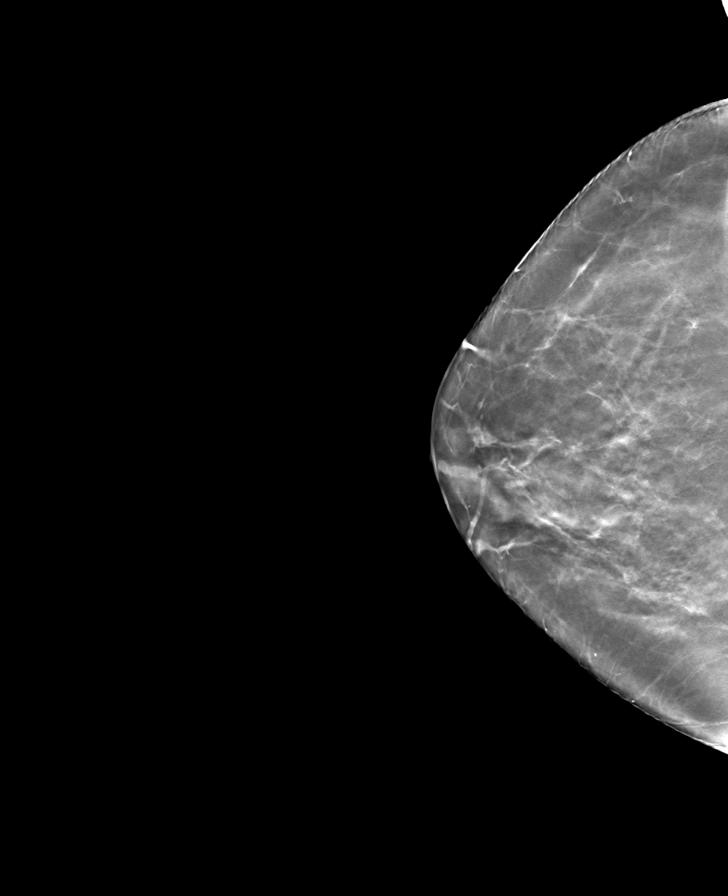

[R CC tomo · tomo slice 37/73.0]
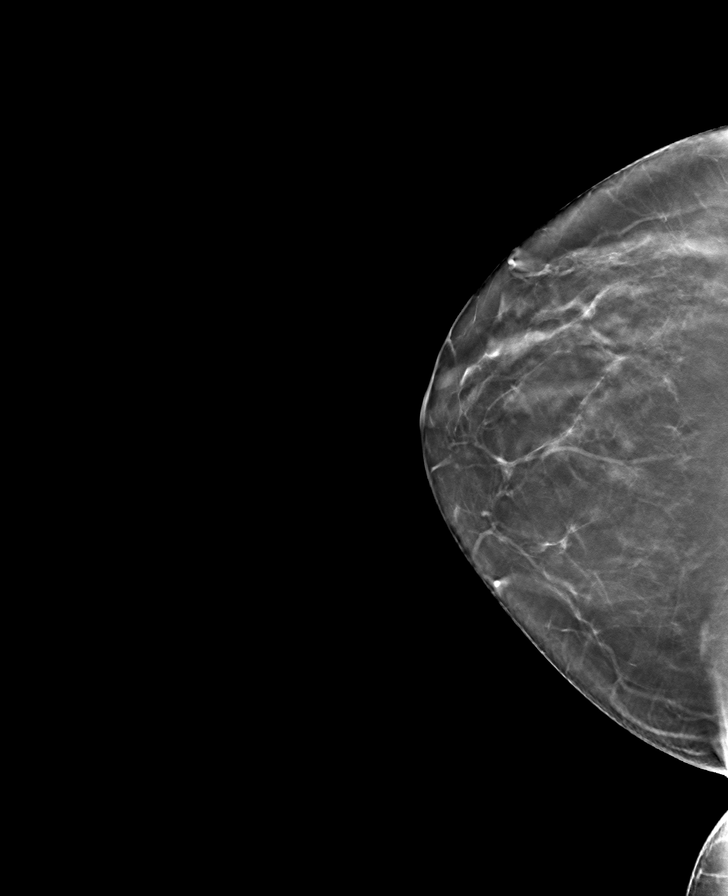

[R MLO tomo · tomo slice 45/90.0]
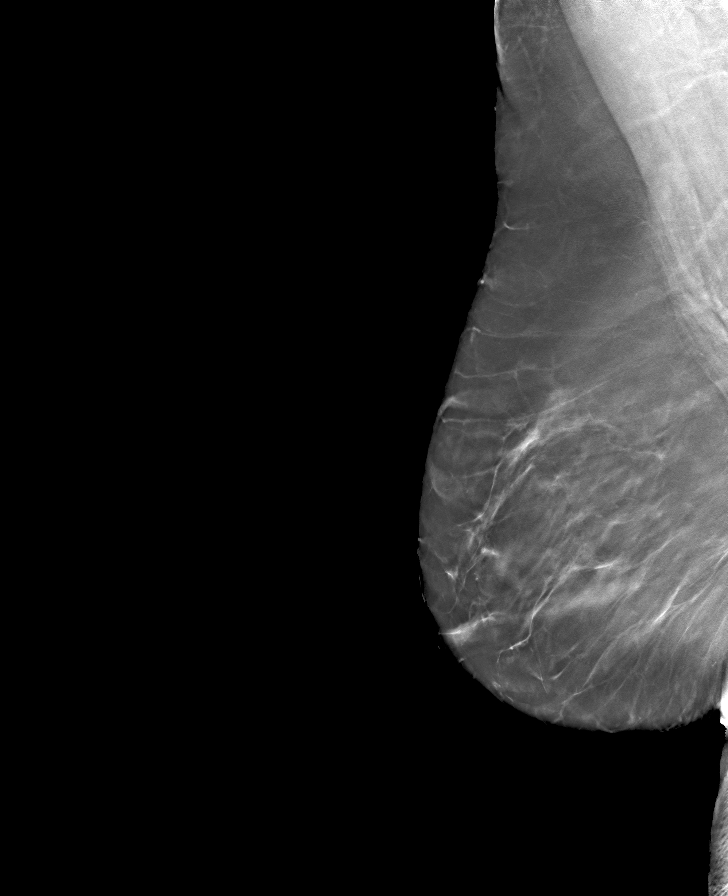

[L MLO tomo · tomo slice 47/93.0]
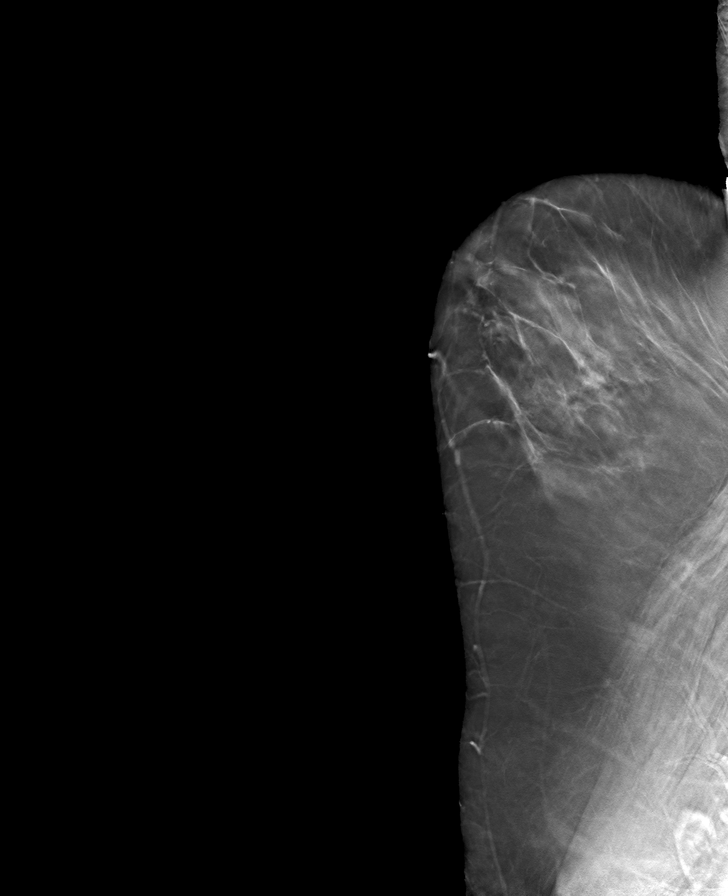

[8 of 24 positions shown; findings below may reference images not displayed]

ACR Breast Density Category b: There are scattered areas of
fibroglandular density.
FINDINGS: There are no findings suspicious for malignancy.
IMPRESSION: No mammographic evidence of malignancy. A result letter of this
screening mammogram will be mailed directly to the patient.

RECOMMENDATION:
Screening mammogram in one year. (Code:51-O-LD2)

BI-RADS CATEGORY  1: Negative.

## 2022-09-19 LAB — CBC AND DIFFERENTIAL
HCT: 41 (ref 36–46)
Hemoglobin: 13.7 (ref 12.0–16.0)
Neutrophils Absolute: 4.5
Platelets: 292 10*3/uL (ref 150–400)
WBC: 6.5

## 2022-09-19 LAB — HEPATIC FUNCTION PANEL
ALT: 19 U/L (ref 7–35)
AST: 23 (ref 13–35)
Alkaline Phosphatase: 99 (ref 25–125)
Bilirubin, Total: 0.3

## 2022-09-19 LAB — BASIC METABOLIC PANEL
BUN: 19 (ref 4–21)
CO2: 25 — AB (ref 13–22)
Chloride: 102 (ref 99–108)
Creatinine: 0.8 (ref 0.5–1.1)
Glucose: 100
Potassium: 4.2 mEq/L (ref 3.5–5.1)
Sodium: 142 (ref 137–147)

## 2022-09-19 LAB — COMPREHENSIVE METABOLIC PANEL
Albumin: 4.6 (ref 3.5–5.0)
Calcium: 9.8 (ref 8.7–10.7)
Globulin: 2.4
eGFR: 82

## 2022-09-19 LAB — CBC: RBC: 4.55 (ref 3.87–5.11)

## 2022-09-28 ENCOUNTER — Encounter (INDEPENDENT_AMBULATORY_CARE_PROVIDER_SITE_OTHER): Payer: Self-pay

## 2022-11-06 ENCOUNTER — Encounter: Payer: Self-pay | Admitting: Family Medicine

## 2022-11-06 ENCOUNTER — Other Ambulatory Visit: Payer: Self-pay | Admitting: Family Medicine

## 2022-11-06 ENCOUNTER — Ambulatory Visit (INDEPENDENT_AMBULATORY_CARE_PROVIDER_SITE_OTHER): Payer: Medicare PPO | Admitting: Family Medicine

## 2022-11-06 VITALS — BP 133/79 | HR 76 | Wt 176.2 lb

## 2022-11-06 DIAGNOSIS — U071 COVID-19: Secondary | ICD-10-CM | POA: Diagnosis not present

## 2022-11-06 DIAGNOSIS — J988 Other specified respiratory disorders: Secondary | ICD-10-CM

## 2022-11-06 DIAGNOSIS — I10 Essential (primary) hypertension: Secondary | ICD-10-CM

## 2022-11-06 DIAGNOSIS — M8580 Other specified disorders of bone density and structure, unspecified site: Secondary | ICD-10-CM

## 2022-11-06 DIAGNOSIS — Z01818 Encounter for other preprocedural examination: Secondary | ICD-10-CM

## 2022-11-06 DIAGNOSIS — M1711 Unilateral primary osteoarthritis, right knee: Secondary | ICD-10-CM

## 2022-11-06 DIAGNOSIS — Z0000 Encounter for general adult medical examination without abnormal findings: Secondary | ICD-10-CM | POA: Diagnosis not present

## 2022-11-06 DIAGNOSIS — E2839 Other primary ovarian failure: Secondary | ICD-10-CM

## 2022-11-06 MED ORDER — ALBUTEROL SULFATE HFA 108 (90 BASE) MCG/ACT IN AERS: 2.0000 | INHALATION_SPRAY | Freq: Four times a day (QID) | RESPIRATORY_TRACT | 0 refills | Status: AC | PRN

## 2022-11-06 NOTE — Progress Notes (Signed)
Office Note 11/06/2022  CC:  Chief Complaint  Patient presents with   Annual Exam    Pt states she has already had a pap, [facility] this summer. Wants to discuss respiratory issue since having Covid 2 weeks ago. Pt also has form to sign from ortho.    HPI:  Patient is a 67 y.o. female who is here for annual health maintenance exam, preoperative medical clearance for upcoming knee surgery, and follow-up hypertension and insomnia. She has been followed now by orthopedics for severe right knee osteoarthritis and she is planning on getting a total knee arthroplasty in the next 1 to 2 months. She did get another injection in the right knee on 11/02/2022 at orthopedist.  Andrea Newton has recently had a respiratory illness. About 10 to 12 days ago she developed headache and bodyaches.  This was followed by development of a cough and decreased taste sensation.  No nasal congestion or runny nose.  Home COVID test was positive early in the illness.  She does say it was an old test. She has a lingering cough.  Reviewed labs done through her bariatric clinic dated 09/20/2022: Normal CBC with differential, normal complete metabolic panel.  She gets good results with use of Ambien for her insomnia. PMP AWARE reviewed today: most recent rx for Ambien was filled 10/17/2022, # 90, rx by me. No red flags.  Past Medical History:  Diagnosis Date   Bleeding internal hemorrhoids 2016   Borderline hypercholesterolemia 2019   10 yr Framingham cv risk = 4.5%. 10/2020 fmhm cv risk=6%.   Constipation    Essential hypertension    GERD (gastroesophageal reflux disease)    History of adenomatous polyp of colon 04/2014   x 1: recall 5 yrs ([facility])   Infectious mononucleosis hepatitis college   Multiple thyroid nodules 2010 approx; 2015   Euthyroid.  Saw ENDO (Dr. Jimmye Norman).  Repeat thyroid u/s 12/2013 showed thyromegaly with multiple nodules meeting biopsy criteria--sent pt to Dr. Elvera Lennox and plan  for repeat u/s (09/2014) and one nodule had grown so bx -->benign.  Labs 03/2014 showed elevted TPO ab, supportive of Hashimoto's thyroiditis--a reassuring finding.  Pt euthyroid. 11/2018 u/s->stable. Annual TFT's with me, 2 yr f/u Dr. Olean Ree   Osteoarthritis    knees>hips.  Takes no meds.   Osteopenia 2017; 2019; 03/2020   Stable DEXA results 2017-2019. T score -2.18 Mar 2020. Rpt 2024.   Postmenopausal status 04/2010   FSH and LH elevated, estrogen low.   Recurrent UTI     Past Surgical History:  Procedure Laterality Date   BIOPSY THYROID  11/05/14   Scant follicular epithelium: benign   Cervical cancer screening  03/11/13   No hx of abnormals.  Neg pap and Neg HR HPV testing: next pap should be after 03/11/2018 and should include HR HPV testing as well.   COLONOSCOPY W/ POLYPECTOMY  05/2011; 04/2014   IH and 'tics ([facility]/ [facility]).  Polyp 04/2014= tubular adenoma (recall 5 yrs)     DEXA  05/2015; 07/2017; 03/18/2020   T score -2.1: osteopenia, fracture risk not high enough to recommend anything other than calcium and vit D.  June 2019: T-score -2.1. 03/2020 T score -2.3. Plan rpt 2024.   ENDOMETRIAL BIOPSY  2006   Performed b/c of abnormal uterine bleeding: normal (fragmented INACTIVE endometrium).  [facility] [place], [name].   HEMORRHOID BANDING  April and May 2016   [facility]   LASIK  2002  Family History  Problem Relation Age of Onset   Arthritis Mother        Osteo (bilat hip replacements)   Cancer Maternal Grandmother        ovarian   Heart disease Maternal Grandmother    Stroke Maternal Grandmother     Social History   Socioeconomic History   Marital status: Married    Spouse name: Not on file   Number of children: Not on file   Years of education: Not on file   Highest education level: Master's degree (e.g., MA, MS, MEng, MEd, MSW, MBA)  Occupational History   Not on file  Tobacco Use   Smoking status: Never   Smokeless tobacco:  Never  Vaping Use   Vaping status: Never Used  Substance and Sexual Activity   Alcohol use: Yes    Alcohol/week: 1.0 standard drink of alcohol    Types: 1 Glasses of wine per week    Comment: socially   Drug use: No   Sexual activity: Not on file  Other Topics Concern   Not on file  Social History Narrative   Married, 2 children (son Musacchia in his 17s, daughter age 35 at [employer]).   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   Native [address], still lives on her [place]'s farm in [place], [name].   No tobacco, rare alcohol, no drugs.  No siblings.   No exercise but "active".   Social Determinants of Health   Financial Resource Strain: Low Risk  (03/18/2022)   Received from [facility], [facility]   Overall Financial Resource Strain (CARDIA)    Difficulty of Paying Living Expenses: Not hard at all  Food Insecurity: No Food Insecurity (03/18/2022)   [facility], [facility]   Hunger Vital Sign    Worried About Running Out of Food in the Last Year: Never true    Ran Out of Food in the Last Year: Never true  Transportation Needs: No Transportation Needs (03/18/2022)   Received from [facility], [facility]   PRAPARE - Transportation    Lack of Transportation (Medical): No    Lack of Transportation (Non-Medical): No  Physical Activity: Insufficiently Active (03/18/2022)   Received from [facility], [facility]   Exercise Vital Sign    Days of Exercise per Week: 2 days    Minutes of Exercise per Session: 20 min  Stress: No Stress Concern Present (03/18/2022)   Received from [facility], [facility] - Occupational Stress Questionnaire    Feeling of Stress : Not at all  Social Connections: Socially Integrated (03/18/2022)   Received from [facility], [facility]   Social Network    How would you rate your social network (family, work, friends)?: Good participation with social networks   Intimate Partner Violence: Not At Risk (03/18/2022)   Received from [facility], [facility]   HITS    Over the last 12 months how often did your partner physically hurt you?: 1    Over the last 12 months how often did your partner insult you or talk down to you?: 1    Over the last 12 months how often did your partner threaten you with physical harm?: 1    Over the last 12 months how often did your partner scream or curse at you?: 1    Outpatient Medications Prior to Visit  Medication Sig Dispense Refill   Ascorbic Acid (VITAMIN C)  500 MG PACK      Calcium Carbonate (CALCIUM 600 PO) Take 600 mg by mouth daily. Take 1-2  tablets once daily.     Ciclopirox 1 % shampoo Apply qod 120 mL 3   ELIDEL 1 % cream as needed.     Famotidine (PEPCID PO) Take by mouth as needed.     hydrochlorothiazide (HYDRODIURIL) 25 MG tablet Take 1 tablet (25 mg total) by mouth daily. 90 tablet 3   loratadine (CLARITIN) 10 MG tablet Take 10 mg by mouth daily.     Multiple Vitamins-Minerals (PRESERVISION AREDS 2+MULTI VIT PO)      pantoprazole (PROTONIX) 40 MG tablet Take 1 tablet (40 mg total) by mouth daily. 90 tablet 3   phentermine (ADIPEX-P) 37.5 MG tablet Take 37.5 mg by mouth daily.     saccharomyces boulardii (FLORASTOR) 250 MG capsule Take by mouth daily.     topiramate (TOPAMAX) 25 MG tablet Take two tablets (50mg ) by mouth every evening for two weeks, then increase to three tablets (75mg ) by mouth every evening .     zolpidem (AMBIEN) 5 MG tablet TAKE 1 TABLET(5 MG) BY MOUTH AT BEDTIME AS NEEDED 90 tablet 1   albuterol (VENTOLIN HFA) 108 (90 Base) MCG/ACT inhaler Inhale 2 puffs into the lungs every 6 (six) hours as needed for wheezing or shortness of breath. 8 g 0   No facility-administered medications prior to visit.    Allergies  Allergen Reactions   Penicillins Rash    Review of Systems  Constitutional:  Negative for appetite change, chills, fatigue and fever.  HENT:  Negative for  congestion, dental problem, ear pain and sore throat.   Eyes:  Negative for discharge, redness and visual disturbance.  Respiratory:  Negative for cough, chest tightness, shortness of breath and wheezing.   Cardiovascular:  Negative for chest pain, palpitations and leg swelling.  Gastrointestinal:  Negative for abdominal pain, blood in stool, diarrhea, nausea and vomiting.  Genitourinary:  Negative for difficulty urinating, dysuria, flank pain, frequency, hematuria and urgency.  Musculoskeletal:  Negative for arthralgias, back pain, joint swelling, myalgias and neck stiffness.  Skin:  Negative for pallor and rash.  Neurological:  Negative for dizziness, speech difficulty, weakness and headaches.  Hematological:  Negative for adenopathy. Does not bruise/bleed easily.  Psychiatric/Behavioral:  Negative for confusion and sleep disturbance. The patient is not nervous/anxious.    PE;    11/06/2022   10:46 AM 08/10/2022    1:32 PM 08/10/2022    1:22 PM  Vitals with BMI  Weight 176 lbs 3 oz  176 lbs 3 oz  Systolic 133 120 409  Diastolic 79 80 92  Pulse 76  105    Gen: Alert, well appearing.  Patient is oriented to person, place, time, and situation. AFFECT: pleasant, lucid thought and speech. ENT: Ears: EACs clear, normal epithelium.  TMs with good light reflex and landmarks bilaterally.  Eyes: no injection, icteris, swelling, or exudate.  EOMI, PERRLA. Nose: no drainage or turbinate edema/swelling.  No injection or focal lesion.  Mouth: lips without lesion/swelling.  Oral mucosa pink and moist.  Dentition intact and without obvious caries or gingival swelling.  Oropharynx without erythema, exudate, or swelling.  Neck: supple/nontender.  No LAD, mass, or TM.  Carotid pulses 2+ bilaterally, without bruits. CV: RRR, no m/r/g.   LUNGS: CTA bilat, nonlabored resps, good aeration in all lung fields. EXT: no clubbing, cyanosis, or edema.  Musculoskeletal: no joint swelling, erythema, warmth, or  tenderness.  ROM of all joints intact. Skin - no sores or suspicious lesions or rashes or color changes  Pertinent labs:  Lab Results  Component Value Date   TSH 2.87 11/09/2021   Lab Results  Component Value Date   WBC 6.3 11/09/2021   HGB 13.2 11/09/2021   HCT 39.7 11/09/2021   MCV 91.4 11/09/2021   PLT 212.0 11/09/2021   Lab Results  Component Value Date   CREATININE 0.74 05/10/2022   BUN 20 05/10/2022   NA 140 05/10/2022   K 3.6 05/10/2022   CL 102 05/10/2022   CO2 31 05/10/2022   Lab Results  Component Value Date   ALT 18 11/09/2021   AST 13 11/09/2021   ALKPHOS 69 11/09/2021   BILITOT 0.5 11/09/2021   Lab Results  Component Value Date   CHOL 203 (H) 11/09/2021   Lab Results  Component Value Date   HDL 63.40 11/09/2021   Lab Results  Component Value Date   LDLCALC 130 (H) 11/09/2021   Lab Results  Component Value Date   TRIG 49.0 11/09/2021   Lab Results  Component Value Date   CHOLHDL 3 11/09/2021   Lab Results  Component Value Date   HGBA1C 5.7 11/09/2021   ASSESSMENT AND PLAN:   #1 health maintenance exam: Reviewed age and gender appropriate health maintenance issues (prudent diet, regular exercise, health risks of tobacco and excessive alcohol, use of seatbelts, fire alarms in home, use of sunscreen).  Also reviewed age and gender appropriate health screening as well as vaccine recommendations. Vaccines: Flu->given today.deferred.  Prevnar 20: deferred.  She will be getting RSV and covid vaccines soon.  Otherwise all utd. Labs: labs last month normal (CBC w/diff and cmet). Cervical ca screening: [facility].. Breast ca screening: mammogram due 05/2023. Colon ca screening: recall 2026. Osteoporosis screening: 03/2020 T score -2.3.  Plan rpt DEXA ordered today.  #2 hypertension, well-controlled on HCTZ 25 mg a day. Electrolytes and kidney function normal on labs 09/20/2022.  #3 insomnia, doing well  long-term on Ambien 5 mg nightly as needed.  #4 COVID infection, gradually resolving. She has benefited from albuterol inhaler in the past so I prescribed a new one today.  #5 severe right knee osteoarthritis.  She is going to get total knee arthroplasty soon. She is cleared, form sent today.  An After Visit Summary was printed and given to the patient.  FOLLOW UP:  Return in about 6 months (around 05/06/2023) for routine chronic illness f/u.  Signed:  Santiago Bumpers, MD           11/06/2022

## 2023-01-15 DIAGNOSIS — R632 Polyphagia: Secondary | ICD-10-CM | POA: Diagnosis not present

## 2023-01-15 DIAGNOSIS — Z760 Encounter for issue of repeat prescription: Secondary | ICD-10-CM | POA: Diagnosis not present

## 2023-01-15 DIAGNOSIS — I10 Essential (primary) hypertension: Secondary | ICD-10-CM | POA: Diagnosis not present

## 2023-01-15 DIAGNOSIS — Z6828 Body mass index (BMI) 28.0-28.9, adult: Secondary | ICD-10-CM | POA: Diagnosis not present

## 2023-01-15 DIAGNOSIS — E663 Overweight: Secondary | ICD-10-CM | POA: Diagnosis not present

## 2023-01-19 DIAGNOSIS — M25561 Pain in right knee: Secondary | ICD-10-CM | POA: Diagnosis not present

## 2023-01-19 DIAGNOSIS — M1711 Unilateral primary osteoarthritis, right knee: Secondary | ICD-10-CM | POA: Diagnosis not present

## 2023-01-19 DIAGNOSIS — G8929 Other chronic pain: Secondary | ICD-10-CM | POA: Diagnosis not present

## 2023-01-29 DIAGNOSIS — G8918 Other acute postprocedural pain: Secondary | ICD-10-CM | POA: Diagnosis not present

## 2023-01-29 DIAGNOSIS — Z96651 Presence of right artificial knee joint: Secondary | ICD-10-CM | POA: Diagnosis not present

## 2023-01-29 DIAGNOSIS — M1711 Unilateral primary osteoarthritis, right knee: Secondary | ICD-10-CM | POA: Diagnosis not present

## 2023-01-29 DIAGNOSIS — M25761 Osteophyte, right knee: Secondary | ICD-10-CM | POA: Diagnosis not present

## 2023-02-08 DIAGNOSIS — Z96651 Presence of right artificial knee joint: Secondary | ICD-10-CM | POA: Diagnosis not present

## 2023-02-09 DIAGNOSIS — Z96651 Presence of right artificial knee joint: Secondary | ICD-10-CM | POA: Diagnosis not present

## 2023-02-09 DIAGNOSIS — M25561 Pain in right knee: Secondary | ICD-10-CM | POA: Diagnosis not present

## 2023-02-12 DIAGNOSIS — Z96651 Presence of right artificial knee joint: Secondary | ICD-10-CM | POA: Diagnosis not present

## 2023-02-12 DIAGNOSIS — M25561 Pain in right knee: Secondary | ICD-10-CM | POA: Diagnosis not present

## 2023-02-16 DIAGNOSIS — Z96651 Presence of right artificial knee joint: Secondary | ICD-10-CM | POA: Diagnosis not present

## 2023-02-16 DIAGNOSIS — M25561 Pain in right knee: Secondary | ICD-10-CM | POA: Diagnosis not present

## 2023-02-20 DIAGNOSIS — Z96651 Presence of right artificial knee joint: Secondary | ICD-10-CM | POA: Diagnosis not present

## 2023-02-20 DIAGNOSIS — M25561 Pain in right knee: Secondary | ICD-10-CM | POA: Diagnosis not present

## 2023-02-23 DIAGNOSIS — M25561 Pain in right knee: Secondary | ICD-10-CM | POA: Diagnosis not present

## 2023-02-23 DIAGNOSIS — Z96651 Presence of right artificial knee joint: Secondary | ICD-10-CM | POA: Diagnosis not present

## 2023-02-27 DIAGNOSIS — Z96651 Presence of right artificial knee joint: Secondary | ICD-10-CM | POA: Diagnosis not present

## 2023-02-27 DIAGNOSIS — M25561 Pain in right knee: Secondary | ICD-10-CM | POA: Diagnosis not present

## 2023-03-02 DIAGNOSIS — Z96651 Presence of right artificial knee joint: Secondary | ICD-10-CM | POA: Diagnosis not present

## 2023-03-02 DIAGNOSIS — M25561 Pain in right knee: Secondary | ICD-10-CM | POA: Diagnosis not present

## 2023-03-06 DIAGNOSIS — M25561 Pain in right knee: Secondary | ICD-10-CM | POA: Diagnosis not present

## 2023-03-06 DIAGNOSIS — Z96651 Presence of right artificial knee joint: Secondary | ICD-10-CM | POA: Diagnosis not present

## 2023-03-08 DIAGNOSIS — M25561 Pain in right knee: Secondary | ICD-10-CM | POA: Diagnosis not present

## 2023-03-08 DIAGNOSIS — Z96651 Presence of right artificial knee joint: Secondary | ICD-10-CM | POA: Diagnosis not present

## 2023-03-13 DIAGNOSIS — M25561 Pain in right knee: Secondary | ICD-10-CM | POA: Diagnosis not present

## 2023-03-13 DIAGNOSIS — Z96651 Presence of right artificial knee joint: Secondary | ICD-10-CM | POA: Diagnosis not present

## 2023-03-16 DIAGNOSIS — M25561 Pain in right knee: Secondary | ICD-10-CM | POA: Diagnosis not present

## 2023-03-16 DIAGNOSIS — Z96651 Presence of right artificial knee joint: Secondary | ICD-10-CM | POA: Diagnosis not present

## 2023-03-23 DIAGNOSIS — M25561 Pain in right knee: Secondary | ICD-10-CM | POA: Diagnosis not present

## 2023-03-23 DIAGNOSIS — Z96651 Presence of right artificial knee joint: Secondary | ICD-10-CM | POA: Diagnosis not present

## 2023-03-28 DIAGNOSIS — M25561 Pain in right knee: Secondary | ICD-10-CM | POA: Diagnosis not present

## 2023-03-28 DIAGNOSIS — Z96651 Presence of right artificial knee joint: Secondary | ICD-10-CM | POA: Diagnosis not present

## 2023-04-02 ENCOUNTER — Telehealth: Payer: Self-pay

## 2023-04-02 ENCOUNTER — Encounter: Payer: Self-pay | Admitting: Family Medicine

## 2023-04-02 ENCOUNTER — Ambulatory Visit: Payer: Medicare PPO | Admitting: Family Medicine

## 2023-04-02 ENCOUNTER — Other Ambulatory Visit: Payer: Self-pay

## 2023-04-02 VITALS — BP 150/98 | HR 101 | Temp 98.4°F | Ht 66.0 in | Wt 182.2 lb

## 2023-04-02 DIAGNOSIS — J4521 Mild intermittent asthma with (acute) exacerbation: Secondary | ICD-10-CM | POA: Diagnosis not present

## 2023-04-02 DIAGNOSIS — J209 Acute bronchitis, unspecified: Secondary | ICD-10-CM | POA: Diagnosis not present

## 2023-04-02 DIAGNOSIS — J069 Acute upper respiratory infection, unspecified: Secondary | ICD-10-CM | POA: Diagnosis not present

## 2023-04-02 MED ORDER — HYDROCODONE BIT-HOMATROP MBR 5-1.5 MG/5ML PO SOLN: ORAL | 0 refills | Status: DC

## 2023-04-02 MED ORDER — AZITHROMYCIN 250 MG PO TABS: ORAL_TABLET | ORAL | 0 refills | Status: DC

## 2023-04-02 MED ORDER — PREDNISONE 20 MG PO TABS: ORAL_TABLET | ORAL | 0 refills | Status: DC

## 2023-04-02 NOTE — Telephone Encounter (Signed)
Sorry, quantity for the prednisone should be 15. Quantity for Zithromax should be 6

## 2023-04-02 NOTE — Telephone Encounter (Signed)
Pt advised new rx sent. 

## 2023-04-02 NOTE — Telephone Encounter (Signed)
Copied from CRM [id number]. Topic: Clinical - Prescription Issue >> Apr 02, 2023  2:20 PM Tiffany H wrote: Reason for CRM: Patient called to advise that there's an issue with her medication. Patient was given less than she thinks the doctor prescribed. According to 04/02/23, patient was prescribed 40mg  prednisone for 5 days, 20mg  for 5 days, then Z pack for 5 days. Please update and assist.

## 2023-04-02 NOTE — Telephone Encounter (Signed)
Prednisone: 2 tabs po every day x 5d then 1 tab po every day x 5d, Normal  Dispense: 10 tablet  Refills: 0 ordered  Pharmacy: [id number] - [place], [place] - [address] (Ph: [phone])  Order Details Ordered on: 04/02/23  Authorizing provider: Jeoffrey Massed, MD     azithromycin (ZITHROMAX) 250 MG tablet  0 ordered         Summary: 2 tabs po qd x 1d, then 1 tab po qd x 4d      Please confirm qty equals sig directions for both medications,

## 2023-04-02 NOTE — Telephone Encounter (Signed)
 Message sent to PCP.

## 2023-04-02 NOTE — Progress Notes (Signed)
OFFICE VISIT  04/02/2023  CC:  Chief Complaint  Patient presents with   Cough    Over 1 week; dry cough, has used Mucinex, nebulizer a few times, albuterol inhaler, otc and prescription cough syrup with no improvement.     Patient is a 68 y.o. female who presents for cough.  HPI: Approximately 7 to 10 days history of cough.  Initially started with nasal congestion/runny nose, cough, some subjective fever and fatigue. Upper respiratory symptoms have resolved and she feels some chest tightness and very persistent nonproductive cough.  She does hear a chest rattle and has a sense that she needs to get some mucus up.  She has used albuterol nebulizer/inhaler and it does bring some short-term relief. Some shortness of breath is felt with significant activities. She has a grandchild who had similar illness before her. Cough getting worse, keeping her up at night and hurting her chest when she coughs.  She got right total knee arthroplasty about 6 weeks ago and rehab is going fine.  ROS as above, plus--> no ear pain or drainage.  No sore throat, no headache.  No dizziness, no rashes, no melena/hematochezia.   no recent changes in lower legs. No n/v/d or abd pain.  No palpitations.    Past Medical History:  Diagnosis Date   Bleeding internal hemorrhoids 2016   Borderline hypercholesterolemia 2019   10 yr Framingham cv risk = 4.5%. 10/2020 fmhm cv risk=6%.   Constipation    Essential hypertension    GERD (gastroesophageal reflux disease)    History of adenomatous polyp of colon 04/2014   x 1: recall 5 yrs ([facility])   Infectious mononucleosis hepatitis college   Mild intermittent asthma    Multiple thyroid nodules 2010 approx; 2015   Euthyroid.  Saw ENDO (Dr. Jimmye Norman).  Repeat thyroid u/s 12/2013 showed thyromegaly with multiple nodules meeting biopsy criteria--sent pt to Dr. Elvera Lennox and plan for repeat u/s (09/2014) and one nodule had grown so bx -->benign.  Labs 03/2014 showed  elevted TPO ab, supportive of Hashimoto's thyroiditis--a reassuring finding.  Pt euthyroid. 11/2018 u/s->stable. Annual TFT's with me, 2 yr f/u Dr. Olean Ree   Osteoarthritis    knees>hips.  Takes no meds.   Osteopenia 2017; 2019; 03/2020   Stable DEXA results 2017-2019. T score -2.18 Mar 2020. Rpt 2024.   Postmenopausal status 04/2010   FSH and LH elevated, estrogen low.   Recurrent UTI     Past Surgical History:  Procedure Laterality Date   BIOPSY THYROID  11/05/14   Scant follicular epithelium: benign   Cervical cancer screening  03/11/13   No hx of abnormals.  Neg pap and Neg HR HPV testing: next pap should be after 03/11/2018 and should include HR HPV testing as well.   COLONOSCOPY W/ POLYPECTOMY  05/2011; 04/2014   IH and 'tics (Dr. Rhetta Mura w/ [facility]).  Polyp 04/2014= tubular adenoma (recall 5 yrs)     DEXA  05/2015; 07/2017; 03/18/2020   T score -2.1: osteopenia, fracture risk not high enough to recommend anything other than calcium and vit D.  June 2019: T-score -2.1. 03/2020 T score -2.3. Plan rpt 2024.   ENDOMETRIAL BIOPSY  2006   Performed b/c of abnormal uterine bleeding: normal (fragmented INACTIVE endometrium).  [facility] [place], [name].   HEMORRHOID BANDING  April and May 2016   [facility]   LASIK  2002    Outpatient Medications Prior to Visit  Medication Sig Dispense Refill  albuterol (VENTOLIN HFA) 108 (90 Base) MCG/ACT inhaler Inhale 2 puffs into the lungs every 6 (six) hours as needed for wheezing or shortness of breath. 8 g 0   Ascorbic Acid (VITAMIN C) 500 MG PACK      Calcium Carbonate (CALCIUM 600 PO) Take 600 mg by mouth daily. Take 1-2  tablets once daily.     Ciclopirox 1 % shampoo Apply qod 120 mL 3   ELIDEL 1 % cream as needed.     Famotidine (PEPCID PO) Take by mouth as needed.     hydrochlorothiazide (HYDRODIURIL) 25 MG tablet Take 1 tablet (25 mg total) by mouth daily. 90 tablet 3   loratadine (CLARITIN) 10 MG tablet Take 10 mg  by mouth daily.     Multiple Vitamins-Minerals (PRESERVISION AREDS 2+MULTI VIT PO)      pantoprazole (PROTONIX) 40 MG tablet TAKE 1 TABLET(40 MG) BY MOUTH DAILY 90 tablet 3   phentermine (ADIPEX-P) 37.5 MG tablet Take 37.5 mg by mouth daily.     saccharomyces boulardii (FLORASTOR) 250 MG capsule Take by mouth daily.     topiramate (TOPAMAX) 25 MG tablet Take two tablets (50mg ) by mouth every evening for two weeks, then increase to three tablets (75mg ) by mouth every evening .     zolpidem (AMBIEN) 5 MG tablet TAKE 1 TABLET(5 MG) BY MOUTH AT BEDTIME AS NEEDED 90 tablet 1   No facility-administered medications prior to visit.    Allergies  Allergen Reactions   Penicillins Rash    Review of Systems  As per HPI  PE:    04/02/2023   11:28 AM 04/02/2023   11:15 AM 11/06/2022   10:46 AM  Vitals with BMI  Height  5\' 6"    Weight  182 lbs 3 oz 176 lbs 3 oz  BMI  29.42   Systolic 150 152 696  Diastolic 98 91 79  Pulse  101 76  Initial blood pressure today 152/91 Manual blood pressure repeat 150/98  Physical Exam  Gen: Alert, well appearing.  Patient is oriented to person, place, time, and situation. AFFECT: pleasant, lucid thought and speech. EXB:MWUX: no injection, icteris, swelling, or exudate.  EOMI, PERRLA. Mouth: lips without lesion/swelling.  Oral mucosa pink and moist. Oropharynx without erythema, exudate, or swelling.  CV: RRR, no m/r/g.   LUNGS: Coarse inspiratory and expiratory rhonchi, nonlabored resps, good aeration in all lung fields. EXT: no clubbing or cyanosis.  No edema.    LABS:  Last CBC Lab Results  Component Value Date   WBC 6.5 09/19/2022   HGB 13.7 09/19/2022   HCT 41 09/19/2022   MCV 91.4 11/09/2021   MCH 30.1 10/24/2012   RDW 13.7 11/09/2021   PLT 292 09/19/2022   Last metabolic panel Lab Results  Component Value Date   GLUCOSE 94 05/10/2022   NA 142 09/19/2022   K 4.2 09/19/2022   CL 102 09/19/2022   CO2 25 (A) 09/19/2022   BUN 19  09/19/2022   CREATININE 0.8 09/19/2022   EGFR 82 09/19/2022   CALCIUM 9.8 09/19/2022   PHOS 3.4 10/24/2012   PROT 6.9 11/09/2021   ALBUMIN 4.6 09/19/2022   BILITOT 0.5 11/09/2021   ALKPHOS 99 09/19/2022   AST 23 09/19/2022   ALT 19 09/19/2022    IMPRESSION AND PLAN:  #1 acute bronchitis, mild exacerbation of asthma. Prolonged symptoms. Prednisone 40 mg a day x 5 days then 20 mg a day x 5 days. Azithromycin x 5 days. Hycodan cough syrup prescribed,  1 to 2 teaspoon twice daily as needed. Continue Ventolin 2 puffs every 6 hours as needed.  An After Visit Summary was printed and given to the patient.  FOLLOW UP: Return if symptoms worsen or fail to improve. Next CPE September/October 2025  Signed:  Santiago Bumpers, MD           04/02/2023

## 2023-04-03 DIAGNOSIS — Z96651 Presence of right artificial knee joint: Secondary | ICD-10-CM | POA: Diagnosis not present

## 2023-04-03 DIAGNOSIS — M25561 Pain in right knee: Secondary | ICD-10-CM | POA: Diagnosis not present

## 2023-04-06 DIAGNOSIS — M25561 Pain in right knee: Secondary | ICD-10-CM | POA: Diagnosis not present

## 2023-04-06 DIAGNOSIS — Z96651 Presence of right artificial knee joint: Secondary | ICD-10-CM | POA: Diagnosis not present

## 2023-04-11 DIAGNOSIS — M25561 Pain in right knee: Secondary | ICD-10-CM | POA: Diagnosis not present

## 2023-04-11 DIAGNOSIS — Z96651 Presence of right artificial knee joint: Secondary | ICD-10-CM | POA: Diagnosis not present

## 2023-04-18 DIAGNOSIS — M25561 Pain in right knee: Secondary | ICD-10-CM | POA: Diagnosis not present

## 2023-04-18 DIAGNOSIS — Z96651 Presence of right artificial knee joint: Secondary | ICD-10-CM | POA: Diagnosis not present

## 2023-04-25 DIAGNOSIS — Z96651 Presence of right artificial knee joint: Secondary | ICD-10-CM | POA: Diagnosis not present

## 2023-04-25 DIAGNOSIS — M25561 Pain in right knee: Secondary | ICD-10-CM | POA: Diagnosis not present

## 2023-05-02 DIAGNOSIS — M25561 Pain in right knee: Secondary | ICD-10-CM | POA: Diagnosis not present

## 2023-05-02 DIAGNOSIS — Z96651 Presence of right artificial knee joint: Secondary | ICD-10-CM | POA: Diagnosis not present

## 2023-05-07 ENCOUNTER — Ambulatory Visit: Payer: Medicare PPO | Admitting: Family Medicine

## 2023-05-09 ENCOUNTER — Ambulatory Visit: Admitting: Family Medicine

## 2023-05-23 ENCOUNTER — Encounter: Payer: Self-pay | Admitting: Family Medicine

## 2023-05-23 ENCOUNTER — Ambulatory Visit: Admitting: Family Medicine

## 2023-05-23 VITALS — BP 137/78 | HR 88 | Temp 98.6°F | Ht 66.0 in | Wt 183.6 lb

## 2023-05-23 DIAGNOSIS — I10 Essential (primary) hypertension: Secondary | ICD-10-CM

## 2023-05-23 DIAGNOSIS — Z79899 Other long term (current) drug therapy: Secondary | ICD-10-CM | POA: Diagnosis not present

## 2023-05-23 DIAGNOSIS — Z23 Encounter for immunization: Secondary | ICD-10-CM

## 2023-05-23 DIAGNOSIS — F5101 Primary insomnia: Secondary | ICD-10-CM | POA: Diagnosis not present

## 2023-05-23 MED ORDER — HYDROCHLOROTHIAZIDE 25 MG PO TABS: 25.0000 mg | ORAL_TABLET | Freq: Every day | ORAL | 3 refills | Status: AC

## 2023-05-23 MED ORDER — ZOLPIDEM TARTRATE 5 MG PO TABS: ORAL_TABLET | ORAL | 1 refills | Status: DC

## 2023-05-23 NOTE — Addendum Note (Signed)
 Addended by: Emi Holes D on: 05/23/2023 02:24 PM   Modules accepted: Orders

## 2023-05-23 NOTE — Progress Notes (Signed)
 OFFICE VISIT  05/23/2023  CC:  Chief Complaint  Patient presents with   Medical Management of Chronic Issues    Patient is a 68 y.o. female who presents for 18-month follow-up hypertension and insomnia.  INTERIM HX: Andrea Newton feels well. She uses Ambien some nights but not every night. Good results consistently.  No side effects.  No home blood pressure monitoring lately.  She stopped monitoring because it had been normal so consistently.   PMP AWARE reviewed today: most recent rx for Ambien was filled 10/17/2022, # 90, rx by me. No red flags.  Past Medical History:  Diagnosis Date   Bleeding internal hemorrhoids 2016   Borderline hypercholesterolemia 2019   10 yr [facility] cv risk = 4.5%. 10/2020 fmhm cv risk=6%.   Constipation    Essential hypertension    GERD (gastroesophageal reflux disease)    History of adenomatous polyp of colon 04/2014   x 1: recall 5 yrs ([facility])   Infectious mononucleosis hepatitis college   Mild intermittent asthma    Multiple thyroid nodules 2010 approx; 2015   Euthyroid.  Saw ENDO (Dr. Jimmye Norman).  Repeat thyroid u/s 12/2013 showed thyromegaly with multiple nodules meeting biopsy criteria--sent pt to Dr. Elvera Lennox and plan for repeat u/s (09/2014) and one nodule had grown so bx -->benign.  Labs 03/2014 showed elevted TPO ab, supportive of Hashimoto's thyroiditis--a reassuring finding.  Pt euthyroid. 11/2018 u/s->stable. Annual TFT's with me, 2 yr f/u Dr. Olean Ree   Osteoarthritis    knees>hips.  Takes no meds.   Osteopenia 2017; 2019; 03/2020   Stable DEXA results 2017-2019. T score -2.18 Mar 2020. Rpt 2024.   Postmenopausal status 04/2010   FSH and LH elevated, estrogen low.   Recurrent UTI     Past Surgical History:  Procedure Laterality Date   BIOPSY THYROID  11/05/14   Scant follicular epithelium: benign   Cervical cancer screening  03/11/13   No hx of abnormals.  Neg pap and Neg HR HPV testing: next pap should be after 03/11/2018 and should  include HR HPV testing as well.   COLONOSCOPY W/ POLYPECTOMY  05/2011; 04/2014   IH and 'tics (Dr. Rhetta Mura w/ [facility]).  Polyp 04/2014= tubular adenoma (recall 5 yrs)     DEXA  05/2015; 07/2017; 03/18/2020   T score -2.1: osteopenia, fracture risk not high enough to recommend anything other than calcium and vit D.  June 2019: T-score -2.1. 03/2020 T score -2.3. Plan rpt 2024.   ENDOMETRIAL BIOPSY  2006   Performed b/c of abnormal uterine bleeding: normal (fragmented INACTIVE endometrium).  [facility] [place], [name].   HEMORRHOID BANDING  April and May 2016   [facility]   LASIK  2002    Outpatient Medications Prior to Visit  Medication Sig Dispense Refill   meloxicam (MOBIC) 15 MG tablet Take 15 mg by mouth daily.     albuterol (VENTOLIN HFA) 108 (90 Base) MCG/ACT inhaler Inhale 2 puffs into the lungs every 6 (six) hours as needed for wheezing or shortness of breath. 8 g 0   Ascorbic Acid (VITAMIN C) 500 MG PACK      Calcium Carbonate (CALCIUM 600 PO) Take 600 mg by mouth daily. Take 1-2  tablets once daily.     Ciclopirox 1 % shampoo Apply qod 120 mL 3   ELIDEL 1 % cream as needed.     Famotidine (PEPCID PO) Take by mouth as needed.     loratadine (CLARITIN) 10 MG tablet Take  10 mg by mouth daily.     Multiple Vitamins-Minerals (PRESERVISION AREDS 2+MULTI VIT PO)      pantoprazole (PROTONIX) 40 MG tablet TAKE 1 TABLET(40 MG) BY MOUTH DAILY 90 tablet 3   phentermine (ADIPEX-P) 37.5 MG tablet Take 37.5 mg by mouth daily.     saccharomyces boulardii (FLORASTOR) 250 MG capsule Take by mouth daily.     topiramate (TOPAMAX) 25 MG tablet Take two tablets (50mg ) by mouth every evening for two weeks, then increase to three tablets (75mg ) by mouth every evening .     hydrochlorothiazide (HYDRODIURIL) 25 MG tablet Take 1 tablet (25 mg total) by mouth daily. 90 tablet 3   zolpidem (AMBIEN) 5 MG tablet TAKE 1 TABLET(5 MG) BY MOUTH AT BEDTIME AS NEEDED 90 tablet 1   No  facility-administered medications prior to visit.    Allergies  Allergen Reactions   Penicillins Rash    Review of Systems As per HPI  PE:    05/23/2023    1:47 PM 04/02/2023   11:28 AM 04/02/2023   11:15 AM  Vitals with BMI  Height 5\' 6"   5\' 6"   Weight 183 lbs 10 oz  182 lbs 3 oz  BMI 29.65  29.42  Systolic 137 150 161  Diastolic 78 98 91  Pulse 88  101     Physical Exam  Gen: Alert, well appearing.  Patient is oriented to person, place, time, and situation. AFFECT: pleasant, lucid thought and speech. No further exam today  LABS:  Last CBC Lab Results  Component Value Date   WBC 6.5 09/19/2022   HGB 13.7 09/19/2022   HCT 41 09/19/2022   MCV 91.4 11/09/2021   MCH 30.1 10/24/2012   RDW 13.7 11/09/2021   PLT 292 09/19/2022   Last metabolic panel Lab Results  Component Value Date   GLUCOSE 94 05/10/2022   NA 142 09/19/2022   K 4.2 09/19/2022   CL 102 09/19/2022   CO2 25 (A) 09/19/2022   BUN 19 09/19/2022   CREATININE 0.8 09/19/2022   EGFR 82 09/19/2022   CALCIUM 9.8 09/19/2022   PHOS 3.4 10/24/2012   PROT 6.9 11/09/2021   ALBUMIN 4.6 09/19/2022   BILITOT 0.5 11/09/2021   ALKPHOS 99 09/19/2022   AST 23 09/19/2022   ALT 19 09/19/2022   Last lipids Lab Results  Component Value Date   CHOL 203 (H) 11/09/2021   HDL 63.40 11/09/2021   LDLCALC 130 (H) 11/09/2021   TRIG 49.0 11/09/2021   CHOLHDL 3 11/09/2021   Last hemoglobin A1c Lab Results  Component Value Date   HGBA1C 5.7 11/09/2021   Last thyroid functions Lab Results  Component Value Date   TSH 2.87 11/09/2021   T3TOTAL 108 11/09/2021   Last vitamin D Lab Results  Component Value Date   VD25OH 42.31 05/14/2015   IMPRESSION AND PLAN:  #1 insomnia, good results with Ambien 5 mg every hour as needed. #90, refill x 1.  #2 hypertension, well-controlled on HCTZ 25 mg a day.    No labs today  An After Visit Summary was printed and given to the patient.  FOLLOW UP: Return in about 6  months (around 11/22/2023) for annual CPE (fasting). Next CPE September/October 2025 Signed:  Santiago Bumpers, MD           05/23/2023

## 2023-05-31 ENCOUNTER — Other Ambulatory Visit: Payer: Self-pay | Admitting: Family Medicine

## 2023-05-31 DIAGNOSIS — Z1231 Encounter for screening mammogram for malignant neoplasm of breast: Secondary | ICD-10-CM

## 2023-06-05 ENCOUNTER — Inpatient Hospital Stay (HOSPITAL_BASED_OUTPATIENT_CLINIC_OR_DEPARTMENT_OTHER): Admission: RE | Admit: 2023-06-05 | Source: Ambulatory Visit | Admitting: Radiology

## 2023-06-05 DIAGNOSIS — Z1231 Encounter for screening mammogram for malignant neoplasm of breast: Secondary | ICD-10-CM

## 2023-07-06 ENCOUNTER — Other Ambulatory Visit: Payer: Medicare PPO

## 2023-07-23 ENCOUNTER — Ambulatory Visit (HOSPITAL_BASED_OUTPATIENT_CLINIC_OR_DEPARTMENT_OTHER)
Admission: RE | Admit: 2023-07-23 | Discharge: 2023-07-23 | Disposition: A | Discharge status: Home / Self Care | Source: Ambulatory Visit | Attending: Family Medicine | Admitting: Family Medicine

## 2023-07-23 ENCOUNTER — Other Ambulatory Visit (HOSPITAL_BASED_OUTPATIENT_CLINIC_OR_DEPARTMENT_OTHER): Payer: Self-pay | Admitting: Family Medicine

## 2023-07-23 DIAGNOSIS — Z1231 Encounter for screening mammogram for malignant neoplasm of breast: Secondary | ICD-10-CM | POA: Diagnosis not present

## 2023-08-16 DIAGNOSIS — Z96651 Presence of right artificial knee joint: Secondary | ICD-10-CM | POA: Diagnosis not present

## 2023-09-06 ENCOUNTER — Ambulatory Visit (INDEPENDENT_AMBULATORY_CARE_PROVIDER_SITE_OTHER): Admitting: Family Medicine

## 2023-09-06 ENCOUNTER — Encounter: Payer: Self-pay | Admitting: Family Medicine

## 2023-09-06 VITALS — BP 115/73 | HR 100 | Temp 98.0°F | Ht 66.0 in | Wt 186.4 lb

## 2023-09-06 DIAGNOSIS — R5383 Other fatigue: Secondary | ICD-10-CM

## 2023-09-06 NOTE — Progress Notes (Signed)
 OFFICE VISIT  09/06/2023  CC:  Chief Complaint  Patient presents with   Fatigue    X4 days; feels a little better after taking Ibuprofen, last dose in the last 2 days   Patient is a 68 y.o. female who presents for feeling tired and lethargic.  HPI: For 5 days Andrea Newton is felt very tired.  Does not feel like she can do anything that she usually does. Denies shortness of breath, cough, fever, or any aches and pains.  No headache or rash.  No URI symptoms. No recent cold or gastrointestinal illness. No known recent sick contacts. She has not changed anything with her diet.  She took her phentermine for a while after I prescribed it but has not taken any for at least 6 weeks.  She does snore but no known history of apneic events during sleep.  ROS as above, plus-->  no CP,  no wheezing, no dizziness,  no melena/hematochezia.  No polyuria or polydipsia.   No focal weakness, paresthesias, or tremors.  No acute vision or hearing abnormalities.  No dysuria or unusual/new urinary urgency or frequency.  No recent changes in lower legs. No n/v/d or abd pain.  No palpitations.    Past Medical History:  Diagnosis Date   Bleeding internal hemorrhoids 2016   Borderline hypercholesterolemia 2019   10 yr [facility] cv risk = 4.5%. 10/2020 fmhm cv risk=6%.   Constipation    Essential hypertension    GERD (gastroesophageal reflux disease)    History of adenomatous polyp of colon 04/2014   x 1: recall 5 yrs ([facility])   Infectious mononucleosis hepatitis college   Mild intermittent asthma    Multiple thyroid  nodules 2010 approx; 2015   Euthyroid.  Saw ENDO (Dr. Aura).  Repeat thyroid  u/s 12/2013 showed thyromegaly with multiple nodules meeting biopsy criteria--sent pt to Dr. Trixie and plan for repeat u/s (09/2014) and one nodule had grown so bx -->benign.  Labs 03/2014 showed elevted TPO ab, supportive of Hashimoto's thyroiditis--a reassuring finding.  Pt euthyroid. 11/2018 u/s->stable. Annual  TFT's with me, 2 yr f/u Dr. Paschal   Osteoarthritis    knees>hips.  Takes no meds.   Osteopenia 2017; 2019; 03/2020   Stable DEXA results 2017-2019. T score -2.18 Mar 2020. Rpt 2024.   Postmenopausal status 04/2010   FSH and LH elevated, estrogen low.   Recurrent UTI     Past Surgical History:  Procedure Laterality Date   BIOPSY THYROID   11/05/14   Scant follicular epithelium: benign   Cervical cancer screening  03/11/13   No hx of abnormals.  Neg pap and Neg HR HPV testing: next pap should be after 03/11/2018 and should include HR HPV testing as well.   COLONOSCOPY W/ POLYPECTOMY  05/2011; 04/2014   IH and 'tics (Dr. Shana w/ [facility]).  Polyp 04/2014= tubular adenoma (recall 5 yrs)     DEXA  05/2015; 07/2017; 03/18/2020   T score -2.1: osteopenia, fracture risk not high enough to recommend anything other than calcium and vit D.  June 2019: T-score -2.1. 03/2020 T score -2.3. Plan rpt 2024.   ENDOMETRIAL BIOPSY  2006   Performed b/c of abnormal uterine bleeding: normal (fragmented INACTIVE endometrium).  [facility] [place], [name].   HEMORRHOID BANDING  April and May 2016   [facility]   LASIK  2002    Outpatient Medications Prior to Visit  Medication Sig Dispense Refill   albuterol  (VENTOLIN  HFA) 108 (90 Base) MCG/ACT inhaler  Inhale 2 puffs into the lungs every 6 (six) hours as needed for wheezing or shortness of breath. 8 g 0   Ascorbic Acid (VITAMIN C) 500 MG PACK      Calcium Carbonate (CALCIUM 600 PO) Take 600 mg by mouth daily. Take 1-2  tablets once daily.     Ciclopirox  1 % shampoo Apply qod 120 mL 3   ELIDEL 1 % cream as needed.     Famotidine (PEPCID PO) Take by mouth as needed.     hydrochlorothiazide  (HYDRODIURIL ) 25 MG tablet Take 1 tablet (25 mg total) by mouth daily. 90 tablet 3   loratadine (CLARITIN) 10 MG tablet Take 10 mg by mouth daily.     meloxicam (MOBIC) 15 MG tablet Take 15 mg by mouth daily.     Multiple Vitamins-Minerals  (PRESERVISION AREDS 2+MULTI VIT PO)      pantoprazole  (PROTONIX ) 40 MG tablet TAKE 1 TABLET(40 MG) BY MOUTH DAILY 90 tablet 3   phentermine (ADIPEX-P) 37.5 MG tablet Take 37.5 mg by mouth daily.     saccharomyces boulardii (FLORASTOR) 250 MG capsule Take by mouth daily.     topiramate (TOPAMAX) 25 MG tablet Take two tablets (50mg ) by mouth every evening for two weeks, then increase to three tablets (75mg ) by mouth every evening .     zolpidem  (AMBIEN ) 5 MG tablet TAKE 1 TABLET(5 MG) BY MOUTH AT BEDTIME AS NEEDED 90 tablet 1   No facility-administered medications prior to visit.    Allergies  Allergen Reactions   Penicillins Rash    Review of Systems  As per HPI  PE:    09/06/2023    3:58 PM 05/23/2023    1:47 PM 04/02/2023   11:28 AM  Vitals with BMI  Height 5' 6 5' 6   Weight 186 lbs 6 oz 183 lbs 10 oz   BMI 30.1 29.65   Systolic 115 137 849  Diastolic 73 78 98  Pulse 100 88      Physical Exam  Gen: Alert, well appearing.  Patient is oriented to person, place, time, and situation. AFFECT: pleasant, lucid thought and speech. ZWU:Zbzd: no injection, icteris, swelling, or exudate.  EOMI, PERRLA. Mouth: lips without lesion/swelling.  Oral mucosa pink and moist. Oropharynx without erythema, exudate, or swelling.  NECK: no LAD or TM or tenderness. CV: RRR, no m/r/g.   LUNGS: CTA bilat, nonlabored resps, good aeration in all lung fields. EXT: no clubbing or cyanosis.  no edema.  SKIN: no rash  LABS:  Last CBC Lab Results  Component Value Date   WBC 6.5 09/19/2022   HGB 13.7 09/19/2022   HCT 41 09/19/2022   MCV 91.4 11/09/2021   MCH 30.1 10/24/2012   RDW 13.7 11/09/2021   PLT 292 09/19/2022   Last metabolic panel Lab Results  Component Value Date   GLUCOSE 94 05/10/2022   NA 142 09/19/2022   K 4.2 09/19/2022   CL 102 09/19/2022   CO2 25 (A) 09/19/2022   BUN 19 09/19/2022   CREATININE 0.8 09/19/2022   EGFR 82 09/19/2022   CALCIUM 9.8 09/19/2022   PHOS 3.4  10/24/2012   PROT 6.9 11/09/2021   ALBUMIN 4.6 09/19/2022   BILITOT 0.5 11/09/2021   ALKPHOS 99 09/19/2022   AST 23 09/19/2022   ALT 19 09/19/2022   Last hemoglobin A1c Lab Results  Component Value Date   HGBA1C 5.7 11/09/2021   Last thyroid  functions Lab Results  Component Value Date   TSH 2.87 11/09/2021  T3TOTAL 108 11/09/2021   THYROIDAB 61 (H) 03/12/2014   IMPRESSION AND PLAN:  Acute fatigue. Unknown etiology. Check CBC with differential, c-Met, TSH, sed rate, CRP, and iron panel.  An After Visit Summary was printed and given to the patient.  FOLLOW UP: Return in about 1 week (around 09/13/2023) for recheck of current acute problem.  Signed:  Gerlene Hockey, MD           09/06/2023

## 2023-09-07 LAB — COMPREHENSIVE METABOLIC PANEL WITH GFR
ALT: 13 U/L (ref 0–35)
AST: 13 U/L (ref 0–37)
Albumin: 4.3 g/dL (ref 3.5–5.2)
Alkaline Phosphatase: 93 U/L (ref 39–117)
BUN: 24 mg/dL — ABNORMAL HIGH (ref 6–23)
CO2: 30 meq/L (ref 19–32)
Calcium: 9.3 mg/dL (ref 8.4–10.5)
Chloride: 104 meq/L (ref 96–112)
Creatinine, Ser: 0.8 mg/dL (ref 0.40–1.20)
GFR: 77.05 mL/min (ref >60.00)
Glucose, Bld: 110 mg/dL — ABNORMAL HIGH (ref 70–99)
Potassium: 3.9 meq/L (ref 3.5–5.1)
Sodium: 142 meq/L (ref 135–145)
Total Bilirubin: 0.3 mg/dL (ref 0.2–1.2)
Total Protein: 6.9 g/dL (ref 6.0–8.3)

## 2023-09-07 LAB — CBC WITH DIFFERENTIAL/PLATELET
Basophils Absolute: 0.1 K/uL (ref 0.0–0.1)
Basophils Relative: 1.4 % (ref 0.0–3.0)
Eosinophils Absolute: 0.2 K/uL (ref 0.0–0.7)
Eosinophils Relative: 2.4 % (ref 0.0–5.0)
HCT: 40.2 % (ref 36.0–46.0)
Hemoglobin: 13.3 g/dL (ref 12.0–15.0)
Lymphocytes Relative: 32.1 % (ref 12.0–46.0)
Lymphs Abs: 2.2 K/uL (ref 0.7–4.0)
MCHC: 33.2 g/dL (ref 30.0–36.0)
MCV: 90.1 fl (ref 78.0–100.0)
Monocytes Absolute: 0.5 K/uL (ref 0.1–1.0)
Monocytes Relative: 7.8 % (ref 3.0–12.0)
Neutro Abs: 3.8 K/uL (ref 1.4–7.7)
Neutrophils Relative %: 56.3 % (ref 43.0–77.0)
Platelets: 267 K/uL (ref 150.0–400.0)
RBC: 4.46 Mil/uL (ref 3.87–5.11)
RDW: 14.1 % (ref 11.5–15.5)
WBC: 6.7 K/uL (ref 4.0–10.5)

## 2023-09-07 LAB — VITAMIN B12: Vitamin B-12: 249 pg/mL (ref 211–911)

## 2023-09-07 LAB — IBC + FERRITIN
Ferritin: 33 ng/mL (ref 10.0–291.0)
Iron: 64 ug/dL (ref 42–145)
Saturation Ratios: 18.7 % — ABNORMAL LOW (ref 20.0–50.0)
TIBC: 341.6 ug/dL (ref 250.0–450.0)
Transferrin: 244 mg/dL (ref 212.0–360.0)

## 2023-09-07 LAB — VITAMIN D 25 HYDROXY (VIT D DEFICIENCY, FRACTURES): VITD: 42.71 ng/mL (ref 30.00–100.00)

## 2023-09-07 LAB — C-REACTIVE PROTEIN: CRP: <1 mg/dL (ref 0.5–20.0)

## 2023-09-07 LAB — TSH: TSH: 2.39 u[IU]/mL (ref 0.35–5.50)

## 2023-09-07 LAB — SEDIMENTATION RATE: Sed Rate: 22 mm/h (ref 0–30)

## 2023-09-09 ENCOUNTER — Ambulatory Visit: Payer: Self-pay | Admitting: Family Medicine

## 2023-09-13 ENCOUNTER — Encounter: Payer: Self-pay | Admitting: Family Medicine

## 2023-09-13 ENCOUNTER — Ambulatory Visit: Admitting: Family Medicine

## 2023-09-13 VITALS — BP 126/74 | HR 85 | Temp 98.0°F | Wt 185.0 lb

## 2023-09-13 DIAGNOSIS — H00012 Hordeolum externum right lower eyelid: Secondary | ICD-10-CM | POA: Diagnosis not present

## 2023-09-13 DIAGNOSIS — R5383 Other fatigue: Secondary | ICD-10-CM

## 2023-09-13 MED ORDER — ERYTHROMYCIN 5 MG/GM OP OINT: TOPICAL_OINTMENT | OPHTHALMIC | 0 refills | Status: DC

## 2023-09-13 NOTE — Progress Notes (Signed)
 OFFICE VISIT  09/13/2023  CC:  Chief Complaint  Patient presents with   Medical Management of Chronic Issues    Patient is a 68 y.o. female who presents for 1 week follow-up fatigue. A/P as of last visit: Acute fatigue. Unknown etiology. Check CBC with differential, c-Met, TSH, sed rate, CRP, and iron panel.  INTERIM HX: Feeling a bit improved, particularly in the mornings.  Still feeling very tired by the afternoon. She developed a stye on the right eye lower lid not long after her last visit with me. She describes having some sneezing. Otherwise, no significant change or new symptoms.  Past Medical History:  Diagnosis Date   Bleeding internal hemorrhoids 2016   Borderline hypercholesterolemia 2019   10 yr [facility] cv risk = 4.5%. 10/2020 fmhm cv risk=6%.   Constipation    Essential hypertension    GERD (gastroesophageal reflux disease)    History of adenomatous polyp of colon 04/2014   x 1: recall 5 yrs ([facility])   Infectious mononucleosis hepatitis [employer]   Mild intermittent asthma    Multiple thyroid  nodules 2010 approx; 2015   Euthyroid.  Saw ENDO (Dr. Aura).  Repeat thyroid  u/s 12/2013 showed thyromegaly with multiple nodules meeting biopsy criteria--sent pt to Dr. Trixie and plan for repeat u/s (09/2014) and one nodule had grown so bx -->benign.  Labs 03/2014 showed elevted TPO ab, supportive of Hashimoto's thyroiditis--a reassuring finding.  Pt euthyroid. 11/2018 u/s->stable. Annual TFT's with me, 2 yr f/u Dr. Paschal   Osteoarthritis    knees>hips.  Takes no meds.   Osteopenia 2017; 2019; 03/2020   Stable DEXA results 2017-2019. T score -2.18 Mar 2020. Rpt 2024.   Postmenopausal status 04/2010   FSH and LH elevated, estrogen low.   Recurrent UTI     Past Surgical History:  Procedure Laterality Date   BIOPSY THYROID   11/05/14   Scant follicular epithelium: benign   Cervical cancer screening  03/11/13   No hx of abnormals.  Neg pap and Neg HR HPV  testing: next pap should be after 03/11/2018 and should include HR HPV testing as well.   COLONOSCOPY W/ POLYPECTOMY  05/2011; 04/2014   IH and 'tics (Dr. Shana w/ [facility]).  Polyp 04/2014= tubular adenoma (recall 5 yrs)     DEXA  05/2015; 07/2017; 03/18/2020   T score -2.1: osteopenia, fracture risk not high enough to recommend anything other than calcium and vit D.  June 2019: T-score -2.1. 03/2020 T score -2.3. Plan rpt 2024.   ENDOMETRIAL BIOPSY  2006   Performed b/c of abnormal uterine bleeding: normal (fragmented INACTIVE endometrium).  [facility] [place], [name].   HEMORRHOID BANDING  April and May 2016   [facility]   LASIK  2002    Outpatient Medications Prior to Visit  Medication Sig Dispense Refill   albuterol  (VENTOLIN  HFA) 108 (90 Base) MCG/ACT inhaler Inhale 2 puffs into the lungs every 6 (six) hours as needed for wheezing or shortness of breath. 8 g 0   Ascorbic Acid (VITAMIN C) 500 MG PACK      Calcium Carbonate (CALCIUM 600 PO) Take 600 mg by mouth daily. Take 1-2  tablets once daily.     Ciclopirox  1 % shampoo Apply qod 120 mL 3   ELIDEL 1 % cream as needed.     Famotidine (PEPCID PO) Take by mouth as needed.     hydrochlorothiazide  (HYDRODIURIL ) 25 MG tablet Take 1 tablet (25 mg total) by mouth daily.  90 tablet 3   loratadine (CLARITIN) 10 MG tablet Take 10 mg by mouth daily.     meloxicam (MOBIC) 15 MG tablet Take 15 mg by mouth daily.     Multiple Vitamins-Minerals (PRESERVISION AREDS 2+MULTI VIT PO)      pantoprazole  (PROTONIX ) 40 MG tablet TAKE 1 TABLET(40 MG) BY MOUTH DAILY 90 tablet 3   phentermine (ADIPEX-P) 37.5 MG tablet Take 37.5 mg by mouth daily.     saccharomyces boulardii (FLORASTOR) 250 MG capsule Take by mouth daily.     topiramate (TOPAMAX) 25 MG tablet Take two tablets (50mg ) by mouth every evening for two weeks, then increase to three tablets (75mg ) by mouth every evening .     zolpidem  (AMBIEN ) 5 MG tablet TAKE 1 TABLET(5  MG) BY MOUTH AT BEDTIME AS NEEDED 90 tablet 1   No facility-administered medications prior to visit.    Allergies  Allergen Reactions   Penicillins Rash    Review of Systems As per HPI  PE:    09/13/2023    3:10 PM 09/06/2023    3:58 PM 05/23/2023    1:47 PM  Vitals with BMI  Height  5' 6 5' 6  Weight 185 lbs 186 lbs 6 oz 183 lbs 10 oz  BMI 29.87 30.1 29.65  Systolic 126 115 862  Diastolic 74 73 78  Pulse 85 100 88    Physical Exam  Gen: Alert, well appearing.  Patient is oriented to person, place, time, and situation. AFFECT: pleasant, lucid thought and speech. No further exam today  LABS:  Last CBC Lab Results  Component Value Date   WBC 6.7 09/06/2023   HGB 13.3 09/06/2023   HCT 40.2 09/06/2023   MCV 90.1 09/06/2023   MCH 30.1 10/24/2012   RDW 14.1 09/06/2023   PLT 267.0 09/06/2023   Lab Results  Component Value Date   IRON 64 09/06/2023   TIBC 341.6 09/06/2023   FERRITIN 33.0 09/06/2023   Last metabolic panel Lab Results  Component Value Date   GLUCOSE 110 (H) 09/06/2023   NA 142 09/06/2023   K 3.9 09/06/2023   CL 104 09/06/2023   CO2 30 09/06/2023   BUN 24 (H) 09/06/2023   CREATININE 0.80 09/06/2023   GFR 77.05 09/06/2023   CALCIUM 9.3 09/06/2023   PHOS 3.4 10/24/2012   PROT 6.9 09/06/2023   ALBUMIN 4.3 09/06/2023   BILITOT 0.3 09/06/2023   ALKPHOS 93 09/06/2023   AST 13 09/06/2023   ALT 13 09/06/2023   Last thyroid  functions Lab Results  Component Value Date   TSH 2.39 09/06/2023   T3TOTAL 108 11/09/2021   THYROIDAB 61 (H) 03/12/2014   Last vitamin D  Lab Results  Component Value Date   VD25OH 42.71 09/06/2023   Last vitamin B12 and Folate Lab Results  Component Value Date   VITAMINB12 249 09/06/2023   Lab Results  Component Value Date   CRP <1.0 09/06/2023   Lab Results  Component Value Date   ESRSEDRATE 22 09/06/2023   IMPRESSION AND PLAN:  Acute fatigue, suspect viral syndrome. Recent lab evaluation and exam  normal. Reassured.  Observation.  Signs/symptoms to call or return for were reviewed and pt expressed understanding.  An After Visit Summary was printed and given to the patient.  FOLLOW UP: Return if symptoms worsen or fail to improve.  Signed:  Gerlene Hockey, MD           09/13/2023

## 2023-10-01 DIAGNOSIS — Z6831 Body mass index (BMI) 31.0-31.9, adult: Secondary | ICD-10-CM | POA: Diagnosis not present

## 2023-10-01 DIAGNOSIS — N8111 Cystocele, midline: Secondary | ICD-10-CM | POA: Diagnosis not present

## 2023-10-01 DIAGNOSIS — Z01419 Encounter for gynecological examination (general) (routine) without abnormal findings: Secondary | ICD-10-CM | POA: Diagnosis not present

## 2023-10-23 ENCOUNTER — Ambulatory Visit (HOSPITAL_BASED_OUTPATIENT_CLINIC_OR_DEPARTMENT_OTHER)

## 2023-10-30 DIAGNOSIS — Z6829 Body mass index (BMI) 29.0-29.9, adult: Secondary | ICD-10-CM | POA: Diagnosis not present

## 2023-10-30 DIAGNOSIS — Z713 Dietary counseling and surveillance: Secondary | ICD-10-CM | POA: Diagnosis not present

## 2023-10-30 DIAGNOSIS — E663 Overweight: Secondary | ICD-10-CM | POA: Diagnosis not present

## 2023-10-30 DIAGNOSIS — Z1331 Encounter for screening for depression: Secondary | ICD-10-CM | POA: Diagnosis not present

## 2023-10-30 DIAGNOSIS — Z7182 Exercise counseling: Secondary | ICD-10-CM | POA: Diagnosis not present

## 2023-11-13 DIAGNOSIS — Z85828 Personal history of other malignant neoplasm of skin: Secondary | ICD-10-CM | POA: Diagnosis not present

## 2023-11-13 DIAGNOSIS — L218 Other seborrheic dermatitis: Secondary | ICD-10-CM | POA: Diagnosis not present

## 2023-11-13 DIAGNOSIS — L570 Actinic keratosis: Secondary | ICD-10-CM | POA: Diagnosis not present

## 2023-11-13 DIAGNOSIS — L814 Other melanin hyperpigmentation: Secondary | ICD-10-CM | POA: Diagnosis not present

## 2023-11-13 DIAGNOSIS — D1801 Hemangioma of skin and subcutaneous tissue: Secondary | ICD-10-CM | POA: Diagnosis not present

## 2023-11-13 DIAGNOSIS — L821 Other seborrheic keratosis: Secondary | ICD-10-CM | POA: Diagnosis not present

## 2023-11-13 DIAGNOSIS — L918 Other hypertrophic disorders of the skin: Secondary | ICD-10-CM | POA: Diagnosis not present

## 2023-11-22 ENCOUNTER — Encounter: Admitting: Family Medicine

## 2023-11-26 ENCOUNTER — Encounter: Payer: Self-pay | Admitting: Family Medicine

## 2023-11-27 DIAGNOSIS — Z6829 Body mass index (BMI) 29.0-29.9, adult: Secondary | ICD-10-CM | POA: Diagnosis not present

## 2023-11-27 DIAGNOSIS — E663 Overweight: Secondary | ICD-10-CM | POA: Diagnosis not present

## 2023-11-27 DIAGNOSIS — E785 Hyperlipidemia, unspecified: Secondary | ICD-10-CM | POA: Diagnosis not present

## 2023-11-27 NOTE — Telephone Encounter (Signed)
 No further action needed at this time. Chart reviewed and is up to date.

## 2023-11-28 ENCOUNTER — Encounter: Admitting: Family Medicine

## 2023-11-29 ENCOUNTER — Ambulatory Visit (INDEPENDENT_AMBULATORY_CARE_PROVIDER_SITE_OTHER): Admitting: Family Medicine

## 2023-11-29 ENCOUNTER — Encounter: Payer: Self-pay | Admitting: Family Medicine

## 2023-11-29 VITALS — BP 123/73 | HR 74 | Temp 98.0°F | Ht 66.0 in | Wt 184.0 lb

## 2023-11-29 DIAGNOSIS — Z9189 Other specified personal risk factors, not elsewhere classified: Secondary | ICD-10-CM | POA: Diagnosis not present

## 2023-11-29 DIAGNOSIS — Z131 Encounter for screening for diabetes mellitus: Secondary | ICD-10-CM | POA: Diagnosis not present

## 2023-11-29 DIAGNOSIS — Z79899 Other long term (current) drug therapy: Secondary | ICD-10-CM

## 2023-11-29 DIAGNOSIS — I10 Essential (primary) hypertension: Secondary | ICD-10-CM

## 2023-11-29 DIAGNOSIS — F5101 Primary insomnia: Secondary | ICD-10-CM | POA: Diagnosis not present

## 2023-11-29 DIAGNOSIS — E7800 Pure hypercholesterolemia, unspecified: Secondary | ICD-10-CM | POA: Diagnosis not present

## 2023-11-29 DIAGNOSIS — Z23 Encounter for immunization: Secondary | ICD-10-CM

## 2023-11-29 DIAGNOSIS — Z0000 Encounter for general adult medical examination without abnormal findings: Secondary | ICD-10-CM | POA: Diagnosis not present

## 2023-11-29 LAB — LIPID PANEL
Cholesterol: 224 mg/dL — ABNORMAL HIGH (ref 0–200)
HDL: 54.6 mg/dL (ref >39.00)
LDL Cholesterol: 150 mg/dL — ABNORMAL HIGH (ref 0–99)
NonHDL: 169.1
Total CHOL/HDL Ratio: 4
Triglycerides: 97 mg/dL (ref 0.0–149.0)
VLDL: 19.4 mg/dL (ref 0.0–40.0)

## 2023-11-29 MED ORDER — ZOLPIDEM TARTRATE 5 MG PO TABS: ORAL_TABLET | ORAL | 1 refills | Status: AC

## 2023-11-29 MED ORDER — PANTOPRAZOLE SODIUM 40 MG PO TBEC: 40.0000 mg | DELAYED_RELEASE_TABLET | Freq: Every day | ORAL | 3 refills | Status: AC

## 2023-11-29 NOTE — Patient Instructions (Signed)

## 2023-11-29 NOTE — Progress Notes (Signed)
 Office Note 11/29/2023  CC:  Chief Complaint  Patient presents with   Medicare Wellness    Pt is fasting    HPI:  Patient is a 68 y.o. female who is here for annual health maintenance exam and 31-month follow-up hypertension and insomnia.  [facility] has her on phentermine and topiramate.  Restarted these about a month ago.  She is trying to improve her dietary and exercise habits.  She does use her Ambien  an average of 2-3 times per week and it is helpful and does not lead to any side effects. PMP AWARE reviewed today: most recent rx for Ambien  was filled for 05/24/23, # 90, rx by me. No red flags.  Past Medical History:  Diagnosis Date   Bleeding internal hemorrhoids 2016   Borderline hypercholesterolemia 2019   10 yr Framingham cv risk = 4.5%. 10/2020 fmhm cv risk=6%.   Constipation    Essential hypertension    GERD (gastroesophageal reflux disease)    History of adenomatous polyp of colon 04/2014   x 1: recall 5 yrs ([facility])   Infectious mononucleosis hepatitis college   Mild intermittent asthma    Multiple thyroid  nodules 2010 approx; 2015   Euthyroid.  Saw ENDO (Dr. Aura).  Repeat thyroid  u/s 12/2013 showed thyromegaly with multiple nodules meeting biopsy criteria--sent pt to Dr. Trixie and plan for repeat u/s (09/2014) and one nodule had grown so bx -->benign.  Labs 03/2014 showed elevted TPO ab, supportive of Hashimoto's thyroiditis--a reassuring finding.  Pt euthyroid. 11/2018 u/s->stable. Annual TFT's with me, 2 yr f/u Dr. Paschal   Osteoarthritis    knees>hips.  Takes no meds.   Osteopenia 2017; 2019; 03/2020   Stable DEXA results 2017-2019. T score -2.18 Mar 2020. Rpt 2024.   Postmenopausal status 04/2010   FSH and LH elevated, estrogen low.   Recurrent UTI     Past Surgical History:  Procedure Laterality Date   BIOPSY THYROID   11/05/2014   Scant follicular epithelium: benign   Cervical cancer screening  03/11/2013   No hx of  abnormals.  Neg pap and Neg HR HPV testing: next pap should be after 03/11/2018 and should include HR HPV testing as well.   COLONOSCOPY W/ POLYPECTOMY  05/2011; 04/2014   IH and 'tics (Dr. Shana [facility]/ [facility]).  Polyp 04/2014= tubular adenoma (recall 5 yrs)     DEXA  05/2015; 07/2017; 03/18/2020   T score -2.1: osteopenia, fracture risk not high enough to recommend anything other than calcium and vit D.  June 2019: T-score -2.1. 03/2020 T score -2.3. Plan rpt 2024.   ENDOMETRIAL BIOPSY  2006   Performed b/c of abnormal uterine bleeding: normal (fragmented INACTIVE endometrium).  [facility] [place], [name].   EYE SURGERY  lasik 2002   HEMORRHOID BANDING  April and May 2016   [facility]   JOINT REPLACEMENT  01/29/2023   right knee   LASIK  2002    Family History  Problem Relation Age of Onset   Arthritis Mother        Osteo (bilat hip replacements)   Cancer Mother    Stroke Mother    Heart disease Mother    Cancer Father    Stroke Father    Vision loss Father    Cancer Maternal Grandmother        ovarian   Heart disease Maternal Grandmother    Stroke Maternal Grandmother    ADD / ADHD Son  Asthma Son    Diabetes Maternal Aunt     Social History   Socioeconomic History   Marital status: Married    Spouse name: Not on file   Number of children: Not on file   Years of education: Not on file   Highest education level: Master's degree (e.g., MA, MS, MEng, MEd, MSW, MBA)  Occupational History   Not on file  Tobacco Use   Smoking status: Never   Smokeless tobacco: Never  Vaping Use   Vaping status: Never Used  Substance and Sexual Activity   Alcohol use: Yes    Alcohol/week: 2.0 standard drinks of alcohol    Types: 2 Glasses of wine per week    Comment: socially   Drug use: No   Sexual activity: Not Currently    Birth control/protection: None  Other Topics Concern   Not on file  Social History Narrative   Married, 2 children (son  Toso in his 25s, daughter age 32 at [employer]).   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   Native [address], still lives on her family's farm in [place], [name].   No tobacco, rare alcohol, no drugs.  No siblings.   No exercise but active.   Social Drivers of Corporate investment banker Strain: Low Risk  (11/25/2023)   Overall Financial Resource Strain (CARDIA)    Difficulty of Paying Living Expenses: Not hard at all  Food Insecurity: No Food Insecurity (11/25/2023)   Hunger Vital Sign    Worried About Running Out of Food in the Last Year: Never true    Ran Out of Food in the Last Year: Never true  Transportation Needs: No Transportation Needs (11/25/2023)   PRAPARE - Administrator, Civil Service (Medical): No    Lack of Transportation (Non-Medical): No  Physical Activity: Insufficiently Active (11/26/2023)   Exercise Vital Sign    Days of Exercise per Week: 2 days    Minutes of Exercise per Session: 20 min  Stress: No Stress Concern Present (11/25/2023)   [employer] of Occupational Health - Occupational Stress Questionnaire    Feeling of Stress: Only a little  Social Connections: Socially Integrated (11/25/2023)   Social Connection and Isolation Panel    Frequency of Communication with Friends and Family: More than three times a week    Frequency of Social Gatherings with Friends and Family: Twice a week    Attends Religious Services: More than 4 times per year    Active Member of [employer] or Organizations: Yes    Attends Engineer, structural: More than 4 times per year    Marital Status: Married  Catering manager Violence: Not At Risk (10/28/2023)   Received from [facility]   HITS    Over the last 12 months how often did your partner physically hurt you?: Never    Over the last 12 months how often did your partner insult you or talk down to you?: Never    Over the last 12 months how often did your partner threaten you  with physical harm?: Never    Over the last 12 months how often did your partner scream or curse at you?: Never    Outpatient Medications Prior to Visit  Medication Sig Dispense Refill   albuterol  (VENTOLIN  HFA) 108 (90 Base) MCG/ACT inhaler Inhale 2 puffs into the lungs every 6 (six) hours as needed for wheezing or shortness of breath. 8 g 0   Ascorbic Acid (  VITAMIN C) 500 MG PACK      Calcium Carbonate (CALCIUM 600 PO) Take 600 mg by mouth daily. Take 1-2  tablets once daily.     Ciclopirox  1 % shampoo Apply qod 120 mL 3   ELIDEL 1 % cream as needed.     erythromycin  ophthalmic ointment Apply thin ribbon to lower lashline of right lower eyelid three times a day for 5d 1 g 0   Famotidine (PEPCID PO) Take by mouth as needed.     hydrochlorothiazide  (HYDRODIURIL ) 25 MG tablet Take 1 tablet (25 mg total) by mouth daily. 90 tablet 3   loratadine (CLARITIN) 10 MG tablet Take 10 mg by mouth daily.     meloxicam (MOBIC) 15 MG tablet Take 15 mg by mouth daily.     Multiple Vitamins-Minerals (PRESERVISION AREDS 2+MULTI VIT PO)      OVER THE COUNTER MEDICATION TURMERIC WITH BLACK PEPPER 1000MG      phentermine (ADIPEX-P) 37.5 MG tablet Take 37.5 mg by mouth daily.     saccharomyces boulardii (FLORASTOR) 250 MG capsule Take by mouth daily.     topiramate (TOPAMAX) 25 MG tablet Take two tablets (50mg ) by mouth every evening for two weeks, then increase to three tablets (75mg ) by mouth every evening .     pantoprazole  (PROTONIX ) 40 MG tablet TAKE 1 TABLET(40 MG) BY MOUTH DAILY 90 tablet 3   zolpidem  (AMBIEN ) 5 MG tablet TAKE 1 TABLET(5 MG) BY MOUTH AT BEDTIME AS NEEDED 90 tablet 1   No facility-administered medications prior to visit.    Allergies  Allergen Reactions   Penicillins Rash    Review of Systems  Constitutional:  Negative for appetite change, chills, fatigue and fever.  HENT:  Negative for congestion, dental problem, ear pain and sore throat.   Eyes:  Negative for discharge, redness  and visual disturbance.  Respiratory:  Negative for cough, chest tightness, shortness of breath and wheezing.   Cardiovascular:  Negative for chest pain, palpitations and leg swelling.  Gastrointestinal:  Negative for abdominal pain, blood in stool, diarrhea, nausea and vomiting.  Genitourinary:  Negative for difficulty urinating, dysuria, flank pain, frequency, hematuria and urgency.  Musculoskeletal:  Negative for arthralgias, back pain, joint swelling, myalgias and neck stiffness.  Skin:  Negative for pallor and rash.  Neurological:  Negative for dizziness, speech difficulty, weakness and headaches.  Hematological:  Negative for adenopathy. Does not bruise/bleed easily.  Psychiatric/Behavioral:  Negative for confusion and sleep disturbance. The patient is not nervous/anxious.     PE;    11/29/2023    8:42 AM 09/13/2023    3:10 PM 09/06/2023    3:58 PM  Vitals with BMI  Height 5' 6  5' 6  Weight 184 lbs 185 lbs 186 lbs 6 oz  BMI 29.71 29.87 30.1  Systolic 123 126 884  Diastolic 73 74 73  Pulse 74 85 100   Exam chaperoned by Jesusa Hahn, CMA Gen: Alert, well appearing.  Patient is oriented to person, place, time, and situation. AFFECT: pleasant, lucid thought and speech. ENT: Ears: EACs clear, normal epithelium.  TMs with good light reflex and landmarks bilaterally.  Eyes: no injection, icteris, swelling, or exudate.  EOMI, PERRLA. Nose: no drainage or turbinate edema/swelling.  No injection or focal lesion.  Mouth: lips without lesion/swelling.  Oral mucosa pink and moist.  Dentition intact and without obvious caries or gingival swelling.  Oropharynx without erythema, exudate, or swelling.  Neck: supple/nontender.  No LAD, mass, or TM.  Carotid  pulses 2+ bilaterally, without bruits. CV: RRR, no m/r/g.   LUNGS: CTA bilat, nonlabored resps, good aeration in all lung fields. ABD: soft, NT, ND, BS normal.  No hepatospenomegaly or mass.  No bruits. EXT: no clubbing, cyanosis, or  edema.  Musculoskeletal: no joint swelling, erythema, warmth, or tenderness.  ROM of all joints intact. Skin - no sores or suspicious lesions or rashes or color changes  Pertinent labs:  Lab Results  Component Value Date   TSH 2.39 09/06/2023   Lab Results  Component Value Date   WBC 6.7 09/06/2023   HGB 13.3 09/06/2023   HCT 40.2 09/06/2023   MCV 90.1 09/06/2023   PLT 267.0 09/06/2023   Lab Results  Component Value Date   CREATININE 0.80 09/06/2023   BUN 24 (H) 09/06/2023   NA 142 09/06/2023   K 3.9 09/06/2023   CL 104 09/06/2023   CO2 30 09/06/2023   Lab Results  Component Value Date   ALT 13 09/06/2023   AST 13 09/06/2023   ALKPHOS 93 09/06/2023   BILITOT 0.3 09/06/2023   Lab Results  Component Value Date   CHOL 203 (H) 11/09/2021   Lab Results  Component Value Date   HDL 63.40 11/09/2021   Lab Results  Component Value Date   LDLCALC 130 (H) 11/09/2021   Lab Results  Component Value Date   TRIG 49.0 11/09/2021   Lab Results  Component Value Date   CHOLHDL 3 11/09/2021   Lab Results  Component Value Date   HGBA1C 5.7 11/09/2021   ASSESSMENT AND PLAN:   #1 health maintenance exam: Reviewed age and gender appropriate health maintenance issues (prudent diet, regular exercise, health risks of tobacco and excessive alcohol, use of seatbelts, fire alarms in home, use of sunscreen).  Also reviewed age and gender appropriate health screening as well as vaccine recommendations. Vaccines: Flu->given today.   Prevnar 20: deferred.  Otherwise all utd. Labs: fasting HP, Hba1c Cervical ca screening: Most [facility]. Breast ca screening: mammogram due 07/2024. Colon ca screening: [facility] 20[facility]). Osteoporosis screening: 03/2020 T score -2.3.  Rpt DEXA is scheduled. CAD screening: She is intermediate risk for coronary artery disease and has had borderline hypercholesterolemia.  Coronary calcium  score ordered today.  2.  Hypertension, well-controlled on HCTZ 25 mg a day. Electrolytes and creatinine have been normal, most recently about 2 months ago.  3.  Insomnia, doing well with long-term as needed use of Ambien  5 mg. Refill ordered today.  4.  Overweight (BMI 29). She is currently a patient with [facility] and is taking Topamax and phentermine.  An After Visit Summary was printed and given to the patient.  FOLLOW UP:  Return in about 6 months (around 05/29/2024) for routine chronic illness f/u.  Signed:  Gerlene Hockey, MD           11/29/2023

## 2023-11-30 LAB — HEMOGLOBIN A1C
Hgb A1c MFr Bld: 5.5 % (ref <5.7)
Mean Plasma Glucose: 111 mg/dL
eAG (mmol/L): 6.2 mmol/L

## 2023-12-01 ENCOUNTER — Ambulatory Visit: Payer: Self-pay | Admitting: Family Medicine

## 2023-12-01 DIAGNOSIS — R911 Solitary pulmonary nodule: Secondary | ICD-10-CM

## 2023-12-03 ENCOUNTER — Other Ambulatory Visit: Payer: Self-pay

## 2023-12-03 ENCOUNTER — Encounter: Payer: Self-pay | Admitting: Family Medicine

## 2023-12-03 ENCOUNTER — Ambulatory Visit (HOSPITAL_BASED_OUTPATIENT_CLINIC_OR_DEPARTMENT_OTHER)
Admission: RE | Admit: 2023-12-03 | Discharge: 2023-12-03 | Disposition: A | Discharge status: Home / Self Care | Payer: Self-pay | Source: Ambulatory Visit | Attending: Family Medicine | Admitting: Family Medicine

## 2023-12-03 DIAGNOSIS — Z9189 Other specified personal risk factors, not elsewhere classified: Secondary | ICD-10-CM | POA: Insufficient documentation

## 2023-12-06 NOTE — Telephone Encounter (Signed)
 Sending back to PCP. FYI reviewed. Pt has reviewed MyChart message sent by provider.

## 2023-12-07 ENCOUNTER — Encounter: Payer: Self-pay | Admitting: Family Medicine

## 2023-12-07 NOTE — Telephone Encounter (Signed)
 Discussed CT results with Andrea Newton today. She has a pulmonology appointment set for 01/09/2024. Signs/symptoms to call or return for were reviewed and pt expressed understanding.

## 2024-01-08 ENCOUNTER — Encounter: Payer: Self-pay | Admitting: Family Medicine

## 2024-01-08 ENCOUNTER — Other Ambulatory Visit (HOSPITAL_BASED_OUTPATIENT_CLINIC_OR_DEPARTMENT_OTHER): Payer: Self-pay

## 2024-01-08 ENCOUNTER — Ambulatory Visit: Admitting: Family Medicine

## 2024-01-08 ENCOUNTER — Ambulatory Visit: Payer: Self-pay

## 2024-01-08 VITALS — BP 134/82 | HR 89 | Resp 16 | Ht 66.0 in | Wt 187.0 lb

## 2024-01-08 DIAGNOSIS — R350 Frequency of micturition: Secondary | ICD-10-CM | POA: Diagnosis not present

## 2024-01-08 LAB — POC URINALSYSI DIPSTICK (AUTOMATED)
Bilirubin, UA: NEGATIVE
Glucose, UA: NEGATIVE
Ketones, UA: NEGATIVE
Leukocytes, UA: NEGATIVE
Nitrite, UA: NEGATIVE
Protein, UA: NEGATIVE
Spec Grav, UA: 1.01 (ref 1.010–1.025)
Urobilinogen, UA: 0.2 U/dL
pH, UA: 6 (ref 5.0–8.0)

## 2024-01-08 MED ORDER — NITROFURANTOIN MONOHYD MACRO 100 MG PO CAPS
100.0000 mg | ORAL_CAPSULE | Freq: Two times a day (BID) | ORAL | 0 refills | Status: DC
  Filled 2024-01-08: qty 14, 7d supply, fill #0

## 2024-01-08 NOTE — Progress Notes (Signed)
 Subjective:    Patient ID: Andrea Newton. Person, female    DOB: 03/10/1955, 68 y.o.   MRN: [id number]  Chief Complaint  Patient presents with   Urinary Frequency    Pt states having freq and urgency, started yesterday    HPI Patient is in today with urgency and some suprapubic discomfort.    Discussed the use of AI scribe software for clinical note transcription with the patient, who gave verbal consent to proceed.  History of Present Illness Andrea Newton. Andrea Newton is a 68 year old female who presents with symptoms suggestive of a urinary tract infection.  She has experienced symptoms similar to previous urinary tract infections, which began on Sunday night and worsened by Monday. Symptoms include urinary urgency and mild cramping, but no dysuria, which is typical for her past infections. She also notes a sensation of needing to urinate with little output.  She has a pessary in place for about a year to support her bladder. She is not sexually active and finds the process of removing and reinserting the pessary unpleasant. She asked whether the pessary could have any impact on her symptoms.  She has a known allergy to penicillin, which caused a rash during childhood, but has not had issues with other antibiotics. She has not taken Macrobid  before. Her last urinary tract infection occurred approximately five years ago, and she recalls experiencing more severe urgency during past episodes.  No significant back pain, although she notes slight discomfort that follows cramping. No history of kidney stones.  \  Ww        Past Surgical History:  Procedure Laterality Date   BIOPSY THYROID   11/05/2014   Scant follicular epithelium: benign   Cervical cancer screening  03/11/2013   No hx of abnormals.  Neg pap and Neg HR HPV testing: next pap should be after 03/11/2018 and should include HR HPV testing as well.   COLONOSCOPY W/ POLYPECTOMY  05/2011; 04/2014   IH and 'tics  (Dr. Shana w/ [facility]).  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  June 2019: T-score -2.1. 03/2020 T score -2.3. Plan rpt 2024.   ENDOMETRIAL BIOPSY  2006   Performed b/c of abnormal uterine bleeding: normal (fragmented INACTIVE endometrium).  [facility], [name].   EYE SURGERY  lasik 2002   HEMORRHOID BANDING  April and May 2016   [facility]   JOINT REPLACEMENT  01/29/2023   right knee   LASIK  2002    Family History  Problem Relation Age of Onset   Arthritis Mother        Osteo (bilat hip replacements)   Cancer Mother    Stroke Mother    Heart disease Mother    Cancer Father    Stroke Father    Vision loss Father    Cancer Maternal Grandmother        ovarian   Heart disease Maternal Grandmother    Stroke Maternal Grandmother    ADD / ADHD Son    Asthma Son    Diabetes Maternal Aunt     Social History   Socioeconomic History   Marital status: Married    Spouse name: Not on file   Number of children: Not  on file   Years of education: Not on file   Highest education level: Master's degree (e.g., MA, MS, MEng, MEd, MSW, MBA)  Occupational History   Not on file  Tobacco Use   Smoking status: Never   Smokeless tobacco: Never  Vaping Use   Vaping status: Never Used  Substance and Sexual Activity   Alcohol use: Yes    Alcohol/week: 2.0 standard drinks of alcohol    Types: 2 Glasses of wine per week    Comment: socially   Drug use: No   Sexual activity: Not Currently    Birth control/protection: None  Other Topics Concern   Not on file  Social History Narrative   Married, 2 children (son [place] in his 24s, daughter age 67 at [employer]).   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   Native [address], still lives on her family's farm in [place], [name].   No tobacco, rare alcohol, no drugs.  No siblings.   No exercise but active.   Social Drivers of Corporate Investment Banker Strain: Low Risk  (11/25/2023)   Overall Financial Resource Strain (CARDIA)    Difficulty of Paying Living Expenses: Not hard at all  Food Insecurity: No Food Insecurity (11/25/2023)   Hunger Vital Sign    Worried About Running Out of Food in the Last Year: Never true    Ran Out of Food in the Last Year: Never true  Transportation Needs: No Transportation Needs (11/25/2023)   PRAPARE - Administrator, Civil Service (Medical): No    Lack of Transportation (Non-Medical): No  Physical Activity: Insufficiently Active (11/26/2023)   Exercise Vital Sign    Days of Exercise per Week: 2 days    Minutes of Exercise per Session: 20 min  Stress: No Stress Concern Present (11/25/2023)   [employer] of Occupational Health - Occupational Stress Questionnaire    Feeling of Stress: Only a little  Social Connections: Socially Integrated (11/25/2023)   Social Connection and Isolation Panel    Frequency of Communication with Friends and Family: More than three times a week    Frequency of Social Gatherings with Friends and Family: Twice a week    Attends Religious Services: More than 4 times per year    Active Member of [employer] or Organizations: Yes    Attends Engineer, Structural: More than 4 times per year    Marital Status: Married  Catering Manager Violence: Not At Risk (10/28/2023)   Received from [facility]   HITS    Over the last 12 months how often did your partner physically hurt you?: Never    Over the last 12 months how often did your partner insult you or talk down to you?: Never    Over the last 12 months how often did your partner threaten you with physical harm?: Never    Over the last 12 months how often did your partner scream or curse at you?: Never    Outpatient  Medications Prior to Visit  Medication Sig Dispense Refill   albuterol  (VENTOLIN  HFA) 108 (90 Base) MCG/ACT inhaler Inhale 2 puffs into the lungs every 6 (six) hours as needed for wheezing or shortness of breath. 8 g 0   Ascorbic Acid (VITAMIN C) 500 MG PACK      Calcium Carbonate (CALCIUM 600 PO) Take 600 mg by mouth daily. Take 1-2  tablets once daily.     Ciclopirox  1 % shampoo Apply qod  120 mL 3   ELIDEL 1 % cream as needed.     erythromycin  ophthalmic ointment Apply thin ribbon to lower lashline of right lower eyelid three times a day for 5d 1 g 0   Famotidine (PEPCID PO) Take by mouth as needed.     hydrochlorothiazide  (HYDRODIURIL ) 25 MG tablet Take 1 tablet (25 mg total) by mouth daily. 90 tablet 3   loratadine (CLARITIN) 10 MG tablet Take 10 mg by mouth daily.     meloxicam (MOBIC) 15 MG tablet Take 15 mg by mouth daily.     Multiple Vitamins-Minerals (PRESERVISION AREDS 2+MULTI VIT PO)      OVER THE COUNTER MEDICATION TURMERIC WITH BLACK PEPPER 1000MG      pantoprazole  (PROTONIX ) 40 MG tablet Take 1 tablet (40 mg total) by mouth daily. 90 tablet 3   phentermine (ADIPEX-P) 37.5 MG tablet Take 37.5 mg by mouth daily.     saccharomyces boulardii (FLORASTOR) 250 MG capsule Take by mouth daily.     topiramate (TOPAMAX) 25 MG tablet Take two tablets (50mg ) by mouth every evening for two weeks, then increase to three tablets (75mg ) by mouth every evening .     zolpidem  (AMBIEN ) 5 MG tablet TAKE 1 TABLET(5 MG) BY MOUTH AT BEDTIME AS NEEDED 90 tablet 1   No facility-administered medications prior to visit.    Allergies  Allergen Reactions   Penicillins Rash    Review of Systems  Constitutional:  Negative for chills, fever and malaise/fatigue.  HENT:  Negative for congestion and hearing loss.   Eyes:  Negative for blurred vision and discharge.  Respiratory:  Negative for cough, sputum production and shortness of breath.   Cardiovascular:  Negative for chest pain, palpitations and leg  swelling.  Gastrointestinal:  Negative for abdominal pain, blood in stool, constipation, diarrhea, heartburn, nausea and vomiting.  Genitourinary:  Negative for dysuria, frequency, hematuria and urgency.  Musculoskeletal:  Negative for back pain, falls and myalgias.  Skin:  Negative for rash.  Neurological:  Negative for dizziness, sensory change, loss of consciousness, weakness and headaches.  Endo/Heme/Allergies:  Negative for environmental allergies. Does not bruise/bleed easily.  Psychiatric/Behavioral:  Negative for depression and suicidal ideas. The patient is not nervous/anxious and does not have insomnia.        Objective:    Physical Exam Vitals and nursing note reviewed.  Constitutional:      General: She is not in acute distress.    Appearance: Normal appearance. She is well-developed.  HENT:     Head: Normocephalic and atraumatic.  Eyes:     General: No scleral icterus.       Right eye: No discharge.        Left eye: No discharge.  Cardiovascular:     Rate and Rhythm: Normal rate and regular rhythm.     Heart sounds: No murmur heard. Pulmonary:     Effort: Pulmonary effort is normal. No respiratory distress.     Breath sounds: Normal breath sounds.  Musculoskeletal:        General: Normal range of motion.     Cervical back: Normal range of motion and neck supple.     Right lower leg: No edema.     Left lower leg: No edema.  Skin:    General: Skin is warm and dry.  Neurological:     Mental Status: She is alert and oriented to person, place, and time.  Psychiatric:        Mood and Affect: Mood  normal.        Behavior: Behavior normal.        Thought Content: Thought content normal.        Judgment: Judgment normal.     BP 134/82 (BP Location: Left Arm, Patient Position: Sitting, Cuff Size: Normal)   Pulse 89   Resp 16   Ht 5' 6 (1.676 m)   Wt 187 lb (84.8 kg)   LMP 02/13/2010   SpO2 96%   BMI 30.18 kg/m  Wt Readings from Last 3 Encounters:  01/08/24  187 lb (84.8 kg)  11/29/23 184 lb (83.5 kg)  09/13/23 185 lb (83.9 kg)    Diabetic Foot Exam - Simple   No data filed    Lab Results  Component Value Date   WBC 6.7 09/06/2023   HGB 13.3 09/06/2023   HCT 40.2 09/06/2023   PLT 267.0 09/06/2023   GLUCOSE 110 (H) 09/06/2023   CHOL 224 (H) 11/29/2023   TRIG 97.0 11/29/2023   HDL 54.60 11/29/2023   LDLCALC 150 (H) 11/29/2023   ALT 13 09/06/2023   AST 13 09/06/2023   NA 142 09/06/2023   K 3.9 09/06/2023   CL 104 09/06/2023   CREATININE 0.80 09/06/2023   BUN 24 (H) 09/06/2023   CO2 30 09/06/2023   TSH 2.39 09/06/2023   HGBA1C 5.5 11/29/2023    Lab Results  Component Value Date   TSH 2.39 09/06/2023   Lab Results  Component Value Date   WBC 6.7 09/06/2023   HGB 13.3 09/06/2023   HCT 40.2 09/06/2023   MCV 90.1 09/06/2023   PLT 267.0 09/06/2023   Lab Results  Component Value Date   NA 142 09/06/2023   K 3.9 09/06/2023   CO2 30 09/06/2023   GLUCOSE 110 (H) 09/06/2023   BUN 24 (H) 09/06/2023   CREATININE 0.80 09/06/2023   BILITOT 0.3 09/06/2023   ALKPHOS 93 09/06/2023   AST 13 09/06/2023   ALT 13 09/06/2023   PROT 6.9 09/06/2023   ALBUMIN 4.3 09/06/2023   CALCIUM 9.3 09/06/2023   EGFR 82 09/19/2022   GFR 77.05 09/06/2023   Lab Results  Component Value Date   CHOL 224 (H) 11/29/2023   Lab Results  Component Value Date   HDL 54.60 11/29/2023   Lab Results  Component Value Date   LDLCALC 150 (H) 11/29/2023   Lab Results  Component Value Date   TRIG 97.0 11/29/2023   Lab Results  Component Value Date   CHOLHDL 4 11/29/2023   Lab Results  Component Value Date   HGBA1C 5.5 11/29/2023       Assessment & Plan:  Urinary frequency -     POCT Urinalysis Dipstick (Automated) -     Urine Culture -     Nitrofurantoin  Monohyd Macro; Take 1 capsule (100 mg total) by mouth 2 (two) times daily.  Dispense: 14 capsule; Refill: 0  Assessment and Plan Assessment & Plan Suspected urinary tract infection  with urinary frequency   Symptoms of urinary urgency and cramping began yesterday without burning sensation. Mild back pain follows cramping. Urinalysis shows trace hematuria. Differential includes UTI, possibly exacerbated by pessary use. History of UTIs, but none in the past five years. Treat empirically due to holiday weekend and potential symptom worsening. Prescribe antibiotic for suspected UTI. Send urine for culture to confirm infection. Keep pessary in place unless advised otherwise by OB GYN. Recommend AZO over-the-counter for burning if it develops. Start antibiotics if symptoms worsen.  Penicillin allergy  Allergy to penicillin with childhood rash history. No known allergies to cephalosporins or other antibiotics. Avoid prescribing penicillin-based antibiotics.    Pratik Dalziel R Lowne Chase, DO

## 2024-01-08 NOTE — Telephone Encounter (Signed)
 FYI Only or Action Required?: FYI only for provider: appointment scheduled on 11.25.25.  Patient was last seen in primary care on 11/29/2023 by McGowen, Aleene DEL, MD.  Called Nurse Triage reporting Urinary Frequency.  Symptoms began several days ago.  Interventions attempted: Nothing.  Symptoms are: unchanged.  Triage Disposition: See HCP Within 4 Hours (Or PCP Triage)  Patient/caregiver understands and will follow disposition?: Yes  Copied from CRM #[id number]. Topic: Clinical - Red Word Triage >> Jan 08, 2024 10:21 AM Franky GRADE wrote: Red Word that prompted transfer to Nurse Triage: Patient is experiencing UTI symptoms, frequent need to urinate with low amounts coming out as well as lower back pain. Reason for Disposition  Side (flank) or lower back pain present  Answer Assessment - Initial Assessment Questions Pt states about 2 days ago she began to notice frequency with urination, urgency, decreased output and pressure. She denied any pain initially but when probed states she is having some slight back pain but attributed it to cramping. Pt denies any fever, vomiting or other symptoms.     1. SYMPTOM: What's the main symptom you're concerned about? (e.g., frequency, incontinence)     Frequency, urgency, not going as much, pressure 2. ONSET: When did the  symptoms  start?     2 days ago.  3. PAIN: Is there any pain? If Yes, ask: How bad is it? (Scale: 1-10; mild, moderate, severe)   Denies pain with urination.  4. CAUSE: What do you think is causing the symptoms?     uti 5. OTHER SYMPTOMS: Do you have any other symptoms? (e.g., blood in urine, fever, flank pain, pain with urination)     When asked, she states she might have some back pain but thinks its just cramping.  Protocols used: Urinary Symptoms-A-AH

## 2024-01-09 ENCOUNTER — Encounter: Payer: Self-pay | Admitting: Emergency Medicine

## 2024-01-09 ENCOUNTER — Ambulatory Visit: Admitting: Emergency Medicine

## 2024-01-09 ENCOUNTER — Telehealth: Payer: Self-pay | Admitting: Emergency Medicine

## 2024-01-09 VITALS — BP 126/84 | HR 80 | Ht 65.0 in | Wt 185.2 lb

## 2024-01-09 DIAGNOSIS — R053 Chronic cough: Secondary | ICD-10-CM

## 2024-01-09 DIAGNOSIS — R911 Solitary pulmonary nodule: Secondary | ICD-10-CM | POA: Diagnosis not present

## 2024-01-09 DIAGNOSIS — Z8616 Personal history of COVID-19: Secondary | ICD-10-CM | POA: Diagnosis not present

## 2024-01-09 DIAGNOSIS — C3490 Malignant neoplasm of unspecified part of unspecified bronchus or lung: Secondary | ICD-10-CM | POA: Insufficient documentation

## 2024-01-09 NOTE — Telephone Encounter (Signed)
 Please schedule the following:  Provider performing procedure: Rochella Benner Diagnosis: Left lower lobe nodule Which side if for nodule / mass?  Left Procedure: Robotic assisted navigational bronchoscopy, endobronchial ultrasound Has patient been spoken to by Provider and given informed consent?  Yes Anesthesia: General Do you need Fluro?  Yes Duration of procedure: 60 minutes Date: 01/21/2024 Alternate Date: 01/22/2024 Time: Any Location: [facility] Does patient have OSA?  No DM?  No or Latex allergy?  No Medication Restriction/ Anticoagulate/Antiplatelet: None Pre-op Labs Ordered:determined by Anesthesia Imaging request: CT chest will be ordered (If, SuperDimension CT Chest, please have STAT courier sent to ENDO)

## 2024-01-09 NOTE — H&P (View-Only) (Signed)
 Subjective:    Patient ID: Andrea Newton. Rexroad, female    DOB: 1955/10/14, 69 y.o.   MRN: [id number]  HPI Discussed the use of AI scribe software for clinical note transcription with the patient, who gave verbal consent to proceed.  History of Present Illness Andrea Newton. Andrea Newton is a 68 year old female who presents with a pulmonary nodule. She is accompanied by Wolm, a family member. She was referred by Dr. Helon for evaluation of a pulmonary nodule noted on chest imaging.  A pulmonary nodule was identified on a cardiac calcium scoring CT scan performed on December 03, 2023. The scan revealed a somewhat rounded subsolid lesion in the left lower lobe measuring 3.0 by 2.1 centimeters with mild associated speculation. She has no known history of lung infections or significant respiratory issues.  She has had COVID-19 three times but has not received the RSV vaccine. She experiences occasional cough, sometimes using cough drops, which she attributes to sinus drainage, particularly when lying down at night.  Her past medical history includes hypertension, GERD, recurrent urinary tract infections, and thyroid  nodules. She has mild intermittent asthma, for which she has used an inhaler in the past few years, primarily during colds. She does not require the inhaler for day-to-day activities and has not undergone official breathing tests for asthma.  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 She has lived locally all her life and has no history of military service or positive TB tests. She helps care for her two-year-old grandson.    Results RADIOLOGY Cardiac calcium scoring CT: Cardiac calcium score 15; rounded subsolid lesion in left lower lobe measuring 3.0 x 2.1 cm with mild associated spiculation (12/03/2023)    Review of Systems As per HPI  Past Medical History:  Diagnosis Date   Bleeding internal hemorrhoids 2016    Borderline hypercholesterolemia 2019   10 yr [facility] cv risk = 4.5%. 10/2020 fmhm cv risk=6%.  October 2025 risk 8%.   Constipation    Essential hypertension    GERD (gastroesophageal reflux disease)    History of adenomatous polyp of colon 04/2014   x 1: recall 5 yrs ([facility])   Infectious mononucleosis hepatitis college   Mild intermittent asthma    Multiple thyroid  nodules 2010 approx; 2015   Euthyroid.  Saw ENDO (Dr. Aura).  Repeat thyroid  u/s 12/2013 showed thyromegaly with multiple nodules meeting biopsy criteria--sent pt to Dr. Trixie and plan for repeat u/s (09/2014) and one nodule had grown so bx -->benign.  Labs 03/2014 showed elevted TPO ab, supportive of Hashimoto's thyroiditis--a reassuring finding.  Pt euthyroid. 11/2018 u/s->stable. Annual TFT's with me, 2 yr f/u Dr. Paschal   Osteoarthritis    knees>hips.  Takes no meds.   Osteopenia 2017; 2019; 03/2020   Stable DEXA results 2017-2019. T score -2.18 Mar 2020. Rpt 2024.   Postmenopausal status 04/2010   FSH and LH elevated, estrogen low.   Pulmonary nodule    3 cm-->pulm ref (11/2023)   Recurrent UTI     Family History  Problem Relation Age of Onset   Arthritis Mother        Osteo (bilat hip replacements)   Cancer Mother    Stroke Mother    Heart disease Mother    Cancer Father    Stroke Father    Vision loss Father    Cancer Maternal Grandmother        ovarian   Heart  disease Maternal Grandmother    Stroke Maternal Grandmother    ADD / ADHD Son    Asthma Son    Diabetes Maternal Aunt      Social History   Socioeconomic History   Marital status: Married    Spouse name: Not on file   Number of children: Not on file   Years of education: Not on file   Highest education level: Master's degree (e.g., MA, MS, MEng, MEd, MSW, MBA)  Occupational History   Not on file  Tobacco Use   Smoking status: Never   Smokeless tobacco: Never  Vaping Use   Vaping status: Never Used  Substance and Sexual  Activity   Alcohol use: Yes    Alcohol/week: 2.0 standard drinks of alcohol    Types: 2 Glasses of wine per week    Comment: socially   Drug use: No   Sexual activity: Not Currently    Birth control/protection: None  Other Topics Concern   Not on file  Social History Narrative   Married, 2 children (son Wargo in his 58s, daughter age 17 at [employer]).   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   Native [address], still lives on her family's farm in [place], [name].   No tobacco, rare alcohol, no drugs.  No siblings.   No exercise but active.   Social Drivers of Corporate Investment Banker Strain: Low Risk  (11/25/2023)   Overall Financial Resource Strain (CARDIA)    Difficulty of Paying Living Expenses: Not hard at all  Food Insecurity: No Food Insecurity (11/25/2023)   Hunger Vital Sign    Worried About Running Out of Food in the Last Year: Never true    Ran Out of Food in the Last Year: Never true  Transportation Needs: No Transportation Needs (11/25/2023)   PRAPARE - Administrator, Civil Service (Medical): No    Lack of Transportation (Non-Medical): No  Physical Activity: Insufficiently Active (11/26/2023)   Exercise Vital Sign    Days of Exercise per Week: 2 days    Minutes of Exercise per Session: 20 min  Stress: No Stress Concern Present (11/25/2023)   [employer] of Occupational Health - Occupational Stress Questionnaire    Feeling of Stress: Only a little  Social Connections: Socially Integrated (11/25/2023)   Social Connection and Isolation Panel    Frequency of Communication with Friends and Family: More than three times a week    Frequency of Social Gatherings with Friends and Family: Twice a week    Attends Religious Services: More than 4 times per year    Active Member of [employer] or Organizations: Yes    Attends Engineer, Structural: More than 4 times per year    Marital Status: Married  Catering Manager  Violence: Not At Risk (10/28/2023)   Received from [facility]   HITS    Over the last 12 months how often did your partner physically hurt you?: Never    Over the last 12 months how often did your partner insult you or talk down to you?: Never    Over the last 12 months how often did your partner threaten you with physical harm?: Never    Over the last 12 months how often did your partner scream or curse at you?: Never    Allergies  Allergen Reactions   Penicillins Rash    Current Outpatient Medications on File Prior to Visit  Medication Sig Dispense Refill  albuterol  (VENTOLIN  HFA) 108 (90 Base) MCG/ACT inhaler Inhale 2 puffs into the lungs every 6 (six) hours as needed for wheezing or shortness of breath. 8 g 0   Ascorbic Acid (VITAMIN C) 500 MG PACK      Calcium Carbonate (CALCIUM 600 PO) Take 600 mg by mouth daily. Take 1-2  tablets once daily.     Ciclopirox  1 % shampoo Apply qod 120 mL 3   ELIDEL 1 % cream as needed.     erythromycin  ophthalmic ointment Apply thin ribbon to lower lashline of right lower eyelid three times a day for 5d 1 g 0   Famotidine (PEPCID PO) Take by mouth as needed.     hydrochlorothiazide  (HYDRODIURIL ) 25 MG tablet Take 1 tablet (25 mg total) by mouth daily. 90 tablet 3   loratadine (CLARITIN) 10 MG tablet Take 10 mg by mouth daily.     Multiple Vitamins-Minerals (PRESERVISION AREDS 2+MULTI VIT PO)      nitrofurantoin , macrocrystal-monohydrate, (MACROBID ) 100 MG capsule Take 1 capsule (100 mg total) by mouth 2 (two) times daily. 14 capsule 0   OVER THE COUNTER MEDICATION TURMERIC WITH BLACK PEPPER 1000MG      pantoprazole  (PROTONIX ) 40 MG tablet Take 1 tablet (40 mg total) by mouth daily. 90 tablet 3   saccharomyces boulardii (FLORASTOR) 250 MG capsule Take by mouth daily.     zolpidem  (AMBIEN ) 5 MG tablet TAKE 1 TABLET(5 MG) BY MOUTH AT BEDTIME AS NEEDED 90 tablet 1   meloxicam (MOBIC) 15 MG tablet Take 15 mg by mouth daily. (Patient not taking:  Reported on 01/09/2024)     phentermine (ADIPEX-P) 37.5 MG tablet Take 37.5 mg by mouth daily. (Patient not taking: Reported on 01/09/2024)     topiramate (TOPAMAX) 25 MG tablet Take two tablets (50mg ) by mouth every evening for two weeks, then increase to three tablets (75mg ) by mouth every evening . (Patient not taking: Reported on 01/09/2024)     No current facility-administered medications on file prior to visit.       Objective:    Vitals:   01/09/24 0842  BP: 126/84  Pulse: 80  SpO2: 98%  Weight: 185 lb 3.2 oz (84 kg)  Height: 5' 5 (1.651 m)   Physical Exam Gen: Pleasant, well-nourished, in no distress,  normal affect  ENT: No lesions,  mouth clear,  oropharynx clear, no postnasal drip  Neck: No JVD, no stridor  Lungs: No use of accessory muscles, no crackles or wheezing on normal respiration, no wheeze on forced expiration  Cardiovascular: RRR, heart sounds normal, no murmur or gallops, no peripheral edema  Musculoskeletal: No deformities, no cyanosis or clubbing  Neuro: alert, awake, non focal  Skin: Warm, no lesions or rashes       Assessment & Plan:   Assessment & Plan Left lower lobe pulmonary nodule   Assessment and Plan Assessment & Plan Solitary pulmonary nodule, left lower lobe 3.0 x 2.1 cm nodule in left lower lobe, suspicious for adenocarcinoma. Characteristics suggest malignancy despite non-smoking status. Early detection favorable for cure. - Ordered PET scan for nodal activity. - Scheduled bronchoscopy for nodule and lymph node biopsy. - Ordered pulmonary function tests. - Discussed potential surgical intervention if adenocarcinoma confirmed.  Chronic cough Intermittent cough, possibly related to sinus drainage. No definitive link to nodule. - Monitor cough symptoms and address as needed.    No follow-ups on file.  I personally spent a total of 61 minutes in the care of the patient today including  preparing to see the patient,  getting/reviewing separately obtained history, performing a medically appropriate exam/evaluation, counseling and educating, placing orders, documenting clinical information in the EHR, independently interpreting results, and communicating results.   Lamar Chris, MD, PhD 01/09/2024, 4:38 PM [facility] [phone] or if no answer before 7:00PM call [phone] For any issues after 7:00PM please call eLink [phone]

## 2024-01-09 NOTE — Progress Notes (Signed)
 Subjective:    Patient ID: Andrea Newton. Rexroad, female    DOB: 1955/10/14, 69 y.o.   MRN: [id number]  HPI Discussed the use of AI scribe software for clinical note transcription with the patient, who gave verbal consent to proceed.  History of Present Illness [name]. [name] is a 68 year old female who presents with a pulmonary nodule. She is accompanied by Wolm, a family member. She was referred by Dr. Helon for evaluation of a pulmonary nodule noted on chest imaging.  A pulmonary nodule was identified on a cardiac calcium scoring CT scan performed on December 03, 2023. The scan revealed a somewhat rounded subsolid lesion in the left lower lobe measuring 3.0 by 2.1 centimeters with mild associated speculation. She has no known history of lung infections or significant respiratory issues.  She has had COVID-19 three times but has not received the RSV vaccine. She experiences occasional cough, sometimes using cough drops, which she attributes to sinus drainage, particularly when lying down at night.  Her past medical history includes hypertension, GERD, recurrent urinary tract infections, and thyroid  nodules. She has mild intermittent asthma, for which she has used an inhaler in the past few years, primarily during colds. She does not require the inhaler for day-to-day activities and has not undergone official breathing tests for asthma.  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 She has lived locally all her life and has no history of military service or positive TB tests. She helps care for her two-year-old grandson.    Results RADIOLOGY Cardiac calcium scoring CT: Cardiac calcium score 15; rounded subsolid lesion in left lower lobe measuring 3.0 x 2.1 cm with mild associated spiculation (12/03/2023)    Review of Systems As per HPI  Past Medical History:  Diagnosis Date   Bleeding internal hemorrhoids 2016    Borderline hypercholesterolemia 2019   10 yr [facility] cv risk = 4.5%. 10/2020 fmhm cv risk=6%.  October 2025 risk 8%.   Constipation    Essential hypertension    GERD (gastroesophageal reflux disease)    History of adenomatous polyp of colon 04/2014   x 1: recall 5 yrs ([facility])   Infectious mononucleosis hepatitis college   Mild intermittent asthma    Multiple thyroid  nodules 2010 approx; 2015   Euthyroid.  Saw ENDO (Dr. Aura).  Repeat thyroid  u/s 12/2013 showed thyromegaly with multiple nodules meeting biopsy criteria--sent pt to Dr. Trixie and plan for repeat u/s (09/2014) and one nodule had grown so bx -->benign.  Labs 03/2014 showed elevted TPO ab, supportive of Hashimoto's thyroiditis--a reassuring finding.  Pt euthyroid. 11/2018 u/s->stable. Annual TFT's with me, 2 yr f/u Dr. Paschal   Osteoarthritis    knees>hips.  Takes no meds.   Osteopenia 2017; 2019; 03/2020   Stable DEXA results 2017-2019. T score -2.18 Mar 2020. Rpt 2024.   Postmenopausal status 04/2010   FSH and LH elevated, estrogen low.   Pulmonary nodule    3 cm-->pulm ref (11/2023)   Recurrent UTI     Family History  Problem Relation Age of Onset   Arthritis Mother        Osteo (bilat hip replacements)   Cancer Mother    Stroke Mother    Heart disease Mother    Cancer Father    Stroke Father    Vision loss Father    Cancer Maternal Grandmother        ovarian   Heart  disease Maternal Grandmother    Stroke Maternal Grandmother    ADD / ADHD Son    Asthma Son    Diabetes Maternal Aunt      Social History   Socioeconomic History   Marital status: Married    Spouse name: Not on file   Number of children: Not on file   Years of education: Not on file   Highest education level: Master's degree (e.g., MA, MS, MEng, MEd, MSW, MBA)  Occupational History   Not on file  Tobacco Use   Smoking status: Never   Smokeless tobacco: Never  Vaping Use   Vaping status: Never Used  Substance and Sexual  Activity   Alcohol use: Yes    Alcohol/week: 2.0 standard drinks of alcohol    Types: 2 Glasses of wine per week    Comment: socially   Drug use: No   Sexual activity: Not Currently    Birth control/protection: None  Other Topics Concern   Not on file  Social History Narrative   Married, 2 children (son Wargo in his 58s, daughter age 17 at [employer]).   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   Native [address], still lives on her family's farm in [place], [name].   No tobacco, rare alcohol, no drugs.  No siblings.   No exercise but active.   Social Drivers of Corporate Investment Banker Strain: Low Risk  (11/25/2023)   Overall Financial Resource Strain (CARDIA)    Difficulty of Paying Living Expenses: Not hard at all  Food Insecurity: No Food Insecurity (11/25/2023)   Hunger Vital Sign    Worried About Running Out of Food in the Last Year: Never true    Ran Out of Food in the Last Year: Never true  Transportation Needs: No Transportation Needs (11/25/2023)   PRAPARE - Administrator, Civil Service (Medical): No    Lack of Transportation (Non-Medical): No  Physical Activity: Insufficiently Active (11/26/2023)   Exercise Vital Sign    Days of Exercise per Week: 2 days    Minutes of Exercise per Session: 20 min  Stress: No Stress Concern Present (11/25/2023)   [employer] of Occupational Health - Occupational Stress Questionnaire    Feeling of Stress: Only a little  Social Connections: Socially Integrated (11/25/2023)   Social Connection and Isolation Panel    Frequency of Communication with Friends and Family: More than three times a week    Frequency of Social Gatherings with Friends and Family: Twice a week    Attends Religious Services: More than 4 times per year    Active Member of [employer] or Organizations: Yes    Attends Engineer, Structural: More than 4 times per year    Marital Status: Married  Catering Manager  Violence: Not At Risk (10/28/2023)   Received from [facility]   HITS    Over the last 12 months how often did your partner physically hurt you?: Never    Over the last 12 months how often did your partner insult you or talk down to you?: Never    Over the last 12 months how often did your partner threaten you with physical harm?: Never    Over the last 12 months how often did your partner scream or curse at you?: Never    Allergies  Allergen Reactions   Penicillins Rash    Current Outpatient Medications on File Prior to Visit  Medication Sig Dispense Refill  albuterol  (VENTOLIN  HFA) 108 (90 Base) MCG/ACT inhaler Inhale 2 puffs into the lungs every 6 (six) hours as needed for wheezing or shortness of breath. 8 g 0   Ascorbic Acid (VITAMIN C) 500 MG PACK      Calcium Carbonate (CALCIUM 600 PO) Take 600 mg by mouth daily. Take 1-2  tablets once daily.     Ciclopirox  1 % shampoo Apply qod 120 mL 3   ELIDEL 1 % cream as needed.     erythromycin  ophthalmic ointment Apply thin ribbon to lower lashline of right lower eyelid three times a day for 5d 1 g 0   Famotidine (PEPCID PO) Take by mouth as needed.     hydrochlorothiazide  (HYDRODIURIL ) 25 MG tablet Take 1 tablet (25 mg total) by mouth daily. 90 tablet 3   loratadine (CLARITIN) 10 MG tablet Take 10 mg by mouth daily.     Multiple Vitamins-Minerals (PRESERVISION AREDS 2+MULTI VIT PO)      nitrofurantoin , macrocrystal-monohydrate, (MACROBID ) 100 MG capsule Take 1 capsule (100 mg total) by mouth 2 (two) times daily. 14 capsule 0   OVER THE COUNTER MEDICATION TURMERIC WITH BLACK PEPPER 1000MG      pantoprazole  (PROTONIX ) 40 MG tablet Take 1 tablet (40 mg total) by mouth daily. 90 tablet 3   saccharomyces boulardii (FLORASTOR) 250 MG capsule Take by mouth daily.     zolpidem  (AMBIEN ) 5 MG tablet TAKE 1 TABLET(5 MG) BY MOUTH AT BEDTIME AS NEEDED 90 tablet 1   meloxicam (MOBIC) 15 MG tablet Take 15 mg by mouth daily. (Patient not taking:  Reported on 01/09/2024)     phentermine (ADIPEX-P) 37.5 MG tablet Take 37.5 mg by mouth daily. (Patient not taking: Reported on 01/09/2024)     topiramate (TOPAMAX) 25 MG tablet Take two tablets (50mg ) by mouth every evening for two weeks, then increase to three tablets (75mg ) by mouth every evening . (Patient not taking: Reported on 01/09/2024)     No current facility-administered medications on file prior to visit.       Objective:    Vitals:   01/09/24 0842  BP: 126/84  Pulse: 80  SpO2: 98%  Weight: 185 lb 3.2 oz (84 kg)  Height: 5' 5 (1.651 m)   Physical Exam Gen: Pleasant, well-nourished, in no distress,  normal affect  ENT: No lesions,  mouth clear,  oropharynx clear, no postnasal drip  Neck: No JVD, no stridor  Lungs: No use of accessory muscles, no crackles or wheezing on normal respiration, no wheeze on forced expiration  Cardiovascular: RRR, heart sounds normal, no murmur or gallops, no peripheral edema  Musculoskeletal: No deformities, no cyanosis or clubbing  Neuro: alert, awake, non focal  Skin: Warm, no lesions or rashes       Assessment & Plan:   Assessment & Plan Left lower lobe pulmonary nodule   Assessment and Plan Assessment & Plan Solitary pulmonary nodule, left lower lobe 3.0 x 2.1 cm nodule in left lower lobe, suspicious for adenocarcinoma. Characteristics suggest malignancy despite non-smoking status. Early detection favorable for cure. - Ordered PET scan for nodal activity. - Scheduled bronchoscopy for nodule and lymph node biopsy. - Ordered pulmonary function tests. - Discussed potential surgical intervention if adenocarcinoma confirmed.  Chronic cough Intermittent cough, possibly related to sinus drainage. No definitive link to nodule. - Monitor cough symptoms and address as needed.    No follow-ups on file.  I personally spent a total of 61 minutes in the care of the patient today including  preparing to see the patient,  getting/reviewing separately obtained history, performing a medically appropriate exam/evaluation, counseling and educating, placing orders, documenting clinical information in the EHR, independently interpreting results, and communicating results.   Lamar Chris, MD, PhD 01/09/2024, 4:38 PM [facility] [phone] or if no answer before 7:00PM call [phone] For any issues after 7:00PM please call eLink [phone]

## 2024-01-09 NOTE — Telephone Encounter (Signed)
 Letter given by Marcus Case# [id number] will send to [facility] for auth

## 2024-01-09 NOTE — Patient Instructions (Signed)
  VISIT SUMMARY: Today, you were seen for a follow-up regarding a pulmonary nodule found on a recent CT scan. We discussed the findings and the next steps for further evaluation. You were accompanied by Bruce, and we reviewed your medical history and current symptoms.  YOUR PLAN: -SOLITARY PULMONARY NODULE, LEFT LOWER LOBE: A pulmonary nodule is a small, round growth in the lung. Your nodule measures 3.0 x 2.1 cm and has characteristics that suggest it could be cancerous, even though you do not smoke. Early detection is important for a potential cure. We have ordered a PET scan to check for activity in the lymph nodes, scheduled a bronchoscopy to biopsy the nodule and lymph nodes, and ordered pulmonary function tests. If the nodule is confirmed to be cancerous, we will discuss surgical options.  -CHRONIC COUGH: You have an intermittent cough that may be related to sinus drainage and not necessarily linked to the pulmonary nodule. We will monitor your cough symptoms and address them as needed.  INSTRUCTIONS: Please follow up with the PET scan, bronchoscopy, and pulmonary function tests as scheduled. We will review the results and discuss the next steps based on the findings. If you experience any new or worsening symptoms, please contact our office.

## 2024-01-14 DIAGNOSIS — M816 Localized osteoporosis [Lequesne]: Secondary | ICD-10-CM | POA: Diagnosis not present

## 2024-01-14 DIAGNOSIS — N958 Other specified menopausal and perimenopausal disorders: Secondary | ICD-10-CM | POA: Diagnosis not present

## 2024-01-15 ENCOUNTER — Ambulatory Visit (HOSPITAL_COMMUNITY)
Admission: RE | Admit: 2024-01-15 | Discharge: 2024-01-15 | Disposition: A | Source: Ambulatory Visit | Attending: Emergency Medicine | Admitting: Emergency Medicine

## 2024-01-15 DIAGNOSIS — E042 Nontoxic multinodular goiter: Secondary | ICD-10-CM | POA: Diagnosis not present

## 2024-01-15 DIAGNOSIS — K449 Diaphragmatic hernia without obstruction or gangrene: Secondary | ICD-10-CM | POA: Diagnosis not present

## 2024-01-15 DIAGNOSIS — R911 Solitary pulmonary nodule: Secondary | ICD-10-CM | POA: Diagnosis not present

## 2024-01-17 ENCOUNTER — Encounter (HOSPITAL_COMMUNITY): Payer: Self-pay | Admitting: Emergency Medicine

## 2024-01-17 ENCOUNTER — Other Ambulatory Visit: Payer: Self-pay

## 2024-01-17 NOTE — Progress Notes (Signed)
 SDW CALL  Patient was given pre-op instructions over the phone. The opportunity was given for the patient to ask questions. No further questions asked. Patient verbalized understanding of instructions given.   PCP - Dr. Aleene Hockey Cardiologist - denies  PPM/ICD - denies   Chest CT - 01/15/24 EKG - DOS Stress Test -denies  ECHO - denies Cardiac Cath - denies  Sleep Study - denies CPAP - n/a  No DM  Last dose of GLP1 agonist-  n/a GLP1 instructions:  n/a  Blood Thinner Instructions: n/a Aspirin Instructions: n/a  ERAS Protcol - NPO PRE-SURGERY Ensure or G2- n/a  COVID TEST- n/a   Anesthesia review: no  Patient denies shortness of breath, fever, cough and chest pain over the phone call   All instructions explained to the patient, with a verbal understanding of the material. Patient agrees to go over the instructions while at home for a better understanding.

## 2024-01-18 ENCOUNTER — Encounter (HOSPITAL_COMMUNITY)
Admission: RE | Admit: 2024-01-18 | Discharge: 2024-01-18 | Attending: Emergency Medicine | Admitting: Emergency Medicine

## 2024-01-18 ENCOUNTER — Telehealth: Payer: Self-pay

## 2024-01-18 DIAGNOSIS — R911 Solitary pulmonary nodule: Secondary | ICD-10-CM

## 2024-01-18 LAB — GLUCOSE, CAPILLARY: Glucose-Capillary: 96 mg/dL (ref 70–99)

## 2024-01-18 MED ORDER — FLUDEOXYGLUCOSE F - 18 (FDG) INJECTION
9.0400 | Freq: Once | INTRAVENOUS | Status: AC
  Administered 2024-01-18: 9.04 via INTRAVENOUS

## 2024-01-18 NOTE — Telephone Encounter (Signed)
 Copied from CRM #[id number]. Topic: Clinical - Medical Advice >> Jan 16, 2024  4:04 PM Isabell A wrote: Reason for CRM: Patient is requesting to speak to someone in regards to her upcoming bronchoscopy - would like to know how much down time after procedure.   Callback number: [phone]   Called and spoke to pt. Per Dr. Pawar, she will be monitored in the endoscopy suite for 1-2 hours after the procedure, and then someone will need to driver her home. She was also told to not drive for 24 hours after the procedure. Pt verbalized understanding, NFN.

## 2024-01-21 ENCOUNTER — Encounter (HOSPITAL_COMMUNITY): Payer: Self-pay | Admitting: Emergency Medicine

## 2024-01-21 ENCOUNTER — Ambulatory Visit (HOSPITAL_COMMUNITY)

## 2024-01-21 ENCOUNTER — Ambulatory Visit (HOSPITAL_COMMUNITY): Payer: Self-pay

## 2024-01-21 ENCOUNTER — Other Ambulatory Visit: Payer: Self-pay

## 2024-01-21 ENCOUNTER — Ambulatory Visit (HOSPITAL_COMMUNITY)
Admission: RE | Admit: 2024-01-21 | Discharge: 2024-01-21 | Disposition: A | Attending: Emergency Medicine | Admitting: Emergency Medicine

## 2024-01-21 ENCOUNTER — Encounter (HOSPITAL_COMMUNITY): Admission: RE | Disposition: A | Payer: Self-pay | Source: Home / Self Care | Attending: Emergency Medicine

## 2024-01-21 DIAGNOSIS — R0989 Other specified symptoms and signs involving the circulatory and respiratory systems: Secondary | ICD-10-CM | POA: Diagnosis not present

## 2024-01-21 DIAGNOSIS — R911 Solitary pulmonary nodule: Secondary | ICD-10-CM | POA: Diagnosis not present

## 2024-01-21 DIAGNOSIS — J449 Chronic obstructive pulmonary disease, unspecified: Secondary | ICD-10-CM | POA: Diagnosis not present

## 2024-01-21 DIAGNOSIS — C3432 Malignant neoplasm of lower lobe, left bronchus or lung: Secondary | ICD-10-CM | POA: Diagnosis not present

## 2024-01-21 DIAGNOSIS — Z48813 Encounter for surgical aftercare following surgery on the respiratory system: Secondary | ICD-10-CM | POA: Diagnosis not present

## 2024-01-21 DIAGNOSIS — R590 Localized enlarged lymph nodes: Secondary | ICD-10-CM

## 2024-01-21 DIAGNOSIS — I10 Essential (primary) hypertension: Secondary | ICD-10-CM | POA: Diagnosis not present

## 2024-01-21 DIAGNOSIS — R918 Other nonspecific abnormal finding of lung field: Secondary | ICD-10-CM | POA: Diagnosis not present

## 2024-01-21 DIAGNOSIS — E785 Hyperlipidemia, unspecified: Secondary | ICD-10-CM | POA: Diagnosis not present

## 2024-01-21 HISTORY — PX: VIDEO BRONCHOSCOPY WITH ENDOBRONCHIAL NAVIGATION: SHX6175

## 2024-01-21 HISTORY — PX: BRONCHIAL WASHINGS: SHX5105

## 2024-01-21 HISTORY — PX: CRYOTHERAPY: SHX6894

## 2024-01-21 HISTORY — PX: VIDEO BRONCHOSCOPY WITH ENDOBRONCHIAL ULTRASOUND: SHX6177

## 2024-01-21 HISTORY — PX: BRONCHIAL NEEDLE ASPIRATION BIOPSY: SHX5106

## 2024-01-21 LAB — CBC
HCT: 39.5 % (ref 36.0–46.0)
Hemoglobin: 12.6 g/dL (ref 12.0–15.0)
MCH: 29.8 pg (ref 26.0–34.0)
MCHC: 31.9 g/dL (ref 30.0–36.0)
MCV: 93.4 fL (ref 80.0–100.0)
Platelets: 257 K/uL (ref 150–400)
RBC: 4.23 MIL/uL (ref 3.87–5.11)
RDW: 13.2 % (ref 11.5–15.5)
WBC: 5.3 K/uL (ref 4.0–10.5)
nRBC: 0 % (ref 0.0–0.2)

## 2024-01-21 LAB — BASIC METABOLIC PANEL WITH GFR
Anion gap: 12 (ref 5–15)
BUN: 14 mg/dL (ref 8–23)
CO2: 25 mmol/L (ref 22–32)
Calcium: 8.8 mg/dL — ABNORMAL LOW (ref 8.9–10.3)
Chloride: 103 mmol/L (ref 98–111)
Creatinine, Ser: 0.69 mg/dL (ref 0.44–1.00)
GFR, Estimated: >60 mL/min (ref >60)
Glucose, Bld: 105 mg/dL — ABNORMAL HIGH (ref 70–99)
Potassium: 3.9 mmol/L (ref 3.5–5.1)
Sodium: 140 mmol/L (ref 135–145)

## 2024-01-21 SURGERY — VIDEO BRONCHOSCOPY WITH ENDOBRONCHIAL NAVIGATION
Anesthesia: General

## 2024-01-21 MED ORDER — LIDOCAINE 2% (20 MG/ML) 5 ML SYRINGE
INTRAMUSCULAR | Status: DC | PRN
  Administered 2024-01-21: 40 mg via INTRAVENOUS

## 2024-01-21 MED ORDER — OXYCODONE HCL 5 MG PO TABS
5.0000 mg | ORAL_TABLET | Freq: Once | ORAL | Status: DC | PRN
  Filled 2024-01-21: qty 1

## 2024-01-21 MED ORDER — LACTATED RINGERS IV SOLN: INTRAVENOUS | Status: DC

## 2024-01-21 MED ORDER — DEXAMETHASONE SOD PHOSPHATE PF 10 MG/ML IJ SOLN
INTRAMUSCULAR | Status: DC | PRN
  Administered 2024-01-21: 10 mg via INTRAVENOUS

## 2024-01-21 MED ORDER — FENTANYL CITRATE (PF) 100 MCG/2ML IJ SOLN: 25.0000 ug | INTRAMUSCULAR | Status: DC | PRN

## 2024-01-21 MED ORDER — OXYCODONE HCL 5 MG/5ML PO SOLN
5.0000 mg | Freq: Once | ORAL | Status: DC | PRN
  Filled 2024-01-21: qty 5

## 2024-01-21 MED ORDER — FENTANYL CITRATE (PF) 250 MCG/5ML IJ SOLN
INTRAMUSCULAR | Status: DC | PRN
  Administered 2024-01-21: 100 ug via INTRAVENOUS

## 2024-01-21 MED ORDER — CHLORHEXIDINE GLUCONATE 0.12 % MT SOLN
15.0000 mL | Freq: Once | OROMUCOSAL | Status: AC
  Administered 2024-01-21: 15 mL via OROMUCOSAL
  Filled 2024-01-21: qty 15

## 2024-01-21 MED ORDER — PROPOFOL 10 MG/ML IV BOLUS
INTRAVENOUS | Status: DC | PRN
  Administered 2024-01-21: 150 mg via INTRAVENOUS
  Administered 2024-01-21: 50 mg via INTRAVENOUS
  Administered 2024-01-21: 175 ug/kg/min via INTRAVENOUS

## 2024-01-21 MED ORDER — SUGAMMADEX SODIUM 200 MG/2ML IV SOLN
INTRAVENOUS | Status: DC | PRN
  Administered 2024-01-21: 200 mg via INTRAVENOUS

## 2024-01-21 MED ORDER — ACETAMINOPHEN 500 MG PO TABS
1000.0000 mg | ORAL_TABLET | Freq: Once | ORAL | Status: AC
  Administered 2024-01-21: 1000 mg via ORAL
  Filled 2024-01-21: qty 2

## 2024-01-21 MED ORDER — ROCURONIUM BROMIDE 10 MG/ML (PF) SYRINGE
PREFILLED_SYRINGE | INTRAVENOUS | Status: DC | PRN
  Administered 2024-01-21: 60 mg via INTRAVENOUS
  Administered 2024-01-21: 10 mg via INTRAVENOUS

## 2024-01-21 MED ORDER — FENTANYL CITRATE (PF) 100 MCG/2ML IJ SOLN
INTRAMUSCULAR | Status: AC
  Filled 2024-01-21: qty 2

## 2024-01-21 MED ORDER — PHENYLEPHRINE HCL-NACL 20-0.9 MG/250ML-% IV SOLN
INTRAVENOUS | Status: DC | PRN
  Administered 2024-01-21: 20 ug/min via INTRAVENOUS

## 2024-01-21 MED ORDER — MIDAZOLAM HCL (PF) 2 MG/2ML IJ SOLN: 0.5000 mg | Freq: Once | INTRAMUSCULAR | Status: DC | PRN

## 2024-01-21 MED ORDER — MEPERIDINE HCL 25 MG/ML IJ SOLN
6.2500 mg | INTRAMUSCULAR | Status: DC | PRN
  Filled 2024-01-21: qty 1

## 2024-01-21 MED ORDER — ONDANSETRON HCL 4 MG/2ML IJ SOLN
INTRAMUSCULAR | Status: DC | PRN
  Administered 2024-01-21: 4 mg via INTRAVENOUS

## 2024-01-21 SURGICAL SUPPLY — 36 items
ADAPTER BRONCHOSCOPE OLYMPUS (ADAPTER) ×3 IMPLANT
ADAPTER VALVE BIOPSY EBUS (MISCELLANEOUS) IMPLANT
BAG COUNTER SPONGE SURGICOUNT (BAG) ×3 IMPLANT
BRUSH CYTOL CELLEBRITY 1.5X140 (MISCELLANEOUS) ×3 IMPLANT
BRUSH SUPERTRAX BIOPSY (INSTRUMENTS) IMPLANT
BRUSH SUPERTRAX NDL-TIP CYTO (INSTRUMENTS) ×3 IMPLANT
CANISTER SUCTION 3000ML PPV (SUCTIONS) ×3 IMPLANT
CNTNR URN SCR LID CUP LEK RST (MISCELLANEOUS) ×3 IMPLANT
COVER BACK TABLE 60X90IN (DRAPES) ×3 IMPLANT
FILTER STRAW FLUID ASPIR (MISCELLANEOUS) IMPLANT
FORCEPS BIOP 1.5 SINGLE USE (MISCELLANEOUS) ×3 IMPLANT
FORCEPS BIOP SUPERTRX PREMAR (INSTRUMENTS) ×3 IMPLANT
GAUZE SPONGE 4X4 12PLY STRL (GAUZE/BANDAGES/DRESSINGS) ×3 IMPLANT
GLOVE BIO SURGEON STRL SZ7.5 (GLOVE) ×6 IMPLANT
GOWN STRL REUS W/ TWL LRG LVL3 (GOWN DISPOSABLE) ×6 IMPLANT
KIT CLEAN ENDO COMPLIANCE (KITS) ×3 IMPLANT
KIT LOCATABLE GUIDE (CANNULA) IMPLANT
KIT MARKER FIDUCIAL DELIVERY (KITS) IMPLANT
KIT TURNOVER KIT B (KITS) ×3 IMPLANT
MARKER SKIN DUAL TIP RULER LAB (MISCELLANEOUS) ×3 IMPLANT
NDL SUPERTRX PREMARK BIOPSY (NEEDLE) ×3 IMPLANT
NEEDLE SUPERTRX PREMARK BIOPSY (NEEDLE) ×3 IMPLANT
OIL SILICONE PENTAX (PARTS (SERVICE/REPAIRS)) ×3 IMPLANT
PAD ARMBOARD POSITIONER FOAM (MISCELLANEOUS) ×6 IMPLANT
PATCHES PATIENT (LABEL) ×9 IMPLANT
SOLN 0.9% NACL POUR BTL 1000ML (IV SOLUTION) ×3 IMPLANT
SOLN STERILE WATER BTL 1000 ML (IV SOLUTION) ×3 IMPLANT
SYR 20ML ECCENTRIC (SYRINGE) ×3 IMPLANT
SYR 20ML LL LF (SYRINGE) ×3 IMPLANT
SYR 50ML SLIP (SYRINGE) ×3 IMPLANT
TOWEL GREEN STERILE FF (TOWEL DISPOSABLE) ×3 IMPLANT
TRAP SPECIMEN MUCUS 40CC (MISCELLANEOUS) IMPLANT
TUBE CONNECTING 20X1/4 (TUBING) ×3 IMPLANT
UNDERPAD 30X36 HEAVY ABSORB (UNDERPADS AND DIAPERS) ×3 IMPLANT
VALVE BIOPSY SINGLE USE (MISCELLANEOUS) ×3 IMPLANT
VALVE SUCTION BRONCHIO DISP (MISCELLANEOUS) ×3 IMPLANT

## 2024-01-21 NOTE — Transfer of Care (Signed)
 Immediate Anesthesia Transfer of Care Note  Patient: Andrea Newton  Procedure(s) Performed: VIDEO BRONCHOSCOPY WITH ENDOBRONCHIAL NAVIGATION (Left) BRONCHOSCOPY, WITH EBUS IRRIGATION, BRONCHUS BRONCHOSCOPY, WITH NEEDLE ASPIRATION BIOPSY CRYOTHERAPY  Patient Location: PACU  Anesthesia Type:General  Level of Consciousness: awake, alert , and oriented  Airway & Oxygen Therapy: Patient Spontanous Breathing  Post-op Assessment: Report given to RN and Post -op Vital signs reviewed and stable  Post vital signs: Reviewed and stable  Last Vitals:  Vitals Value Taken Time  BP 97/63 01/21/24 1300  Temp    Pulse 93 01/21/24 1300  Resp 25 01/21/24 1300  SpO2 91% 01/21/24 1300    Last Pain:  Vitals:   01/21/24 0915  TempSrc:   PainSc: 0-No pain         Complications: No notable events documented.

## 2024-01-21 NOTE — Discharge Instructions (Addendum)

## 2024-01-21 NOTE — Interval H&P Note (Signed)
 History and Physical Interval Note:  01/21/2024 9:47 AM  Andrea Newton  has presented today for surgery, with the diagnosis of Left lower lobe nodule.  The various methods of treatment have been discussed with the patient and family. After consideration of risks, benefits and other options for treatment, the patient has consented to  Procedure(s) with comments: VIDEO BRONCHOSCOPY WITH ENDOBRONCHIAL NAVIGATION (Left) - EBUS scope available also BRONCHOSCOPY, WITH EBUS (N/A) as a surgical intervention.  The patient's history has been reviewed, patient examined, no change in status, stable for surgery.  I have reviewed the patient's chart and labs.  Questions were answered to the patient's satisfaction.     Lamar GORMAN Chris

## 2024-01-21 NOTE — Op Note (Signed)
 Procedure Note  Patient: Andrea Newton  [employer] Cios mobile C-arm was utilized to identify and biopsy left lower lobe mixed density nodule.  Needle-in-lesion was confirmed using real-time Cios imaging, and images were uploaded to PACS.      Lamar Chris, MD, PhD 01/21/2024, 12:58 PM [facility] [phone] or if no answer before 7:00PM call [phone] For any issues after 7:00PM please call eLink [phone]

## 2024-01-21 NOTE — Anesthesia Preprocedure Evaluation (Addendum)
 Anesthesia Evaluation  Patient identified by MRN, date of birth, ID band Patient awake    Reviewed: Allergy & Precautions, NPO status , Patient's Chart, lab work & pertinent test results  History of Anesthesia Complications Negative for: history of anesthetic complications  Airway Mallampati: I  TM Distance: >3 FB Neck ROM: Full    Dental  (+) Dental Advisory Given   Pulmonary asthma , COPD,  COPD inhaler   breath sounds clear to auscultation       Cardiovascular hypertension,  Rhythm:Regular Rate:Normal     Neuro/Psych negative neurological ROS     GI/Hepatic Neg liver ROS,GERD  Medicated and Controlled,,  Endo/Other  BMI 30  Renal/GU negative Renal ROS     Musculoskeletal   Abdominal   Peds  Hematology Hb 12.6, plt 257k   Anesthesia Other Findings   Reproductive/Obstetrics                              Anesthesia Physical Anesthesia Plan  ASA: 3  Anesthesia Plan: General   Post-op Pain Management: Tylenol  PO (pre-op)*   Induction: Intravenous  PONV Risk Score and Plan: 3 and Ondansetron , Dexamethasone  and Treatment may vary due to age or medical condition  Airway Management Planned: Oral ETT  Additional Equipment: None  Intra-op Plan:   Post-operative Plan: Extubation in OR  Informed Consent: I have reviewed the patients History and Physical, chart, labs and discussed the procedure including the risks, benefits and alternatives for the proposed anesthesia with the patient or authorized representative who has indicated his/her understanding and acceptance.     Dental advisory given  Plan Discussed with: CRNA and Surgeon  Anesthesia Plan Comments:          Anesthesia Quick Evaluation

## 2024-01-21 NOTE — Anesthesia Postprocedure Evaluation (Signed)
 Anesthesia Post Note  Patient: Andrea Newton  Procedure(s) Performed: VIDEO BRONCHOSCOPY WITH ENDOBRONCHIAL NAVIGATION (Left) BRONCHOSCOPY, WITH EBUS IRRIGATION, BRONCHUS BRONCHOSCOPY, WITH NEEDLE ASPIRATION BIOPSY CRYOTHERAPY     Patient location during evaluation: PACU Anesthesia Type: General Level of consciousness: awake and alert, oriented and patient cooperative Pain management: pain level controlled Vital Signs Assessment: post-procedure vital signs reviewed and stable Respiratory status: spontaneous breathing, nonlabored ventilation and respiratory function stable Cardiovascular status: blood pressure returned to baseline and stable Postop Assessment: no apparent nausea or vomiting and able to ambulate Anesthetic complications: no   No notable events documented.  Last Vitals:  Vitals:   01/21/24 1315 01/21/24 1330  BP: 102/61 109/64  Pulse: 87 78  Resp: 20 19  Temp:  37.1 C  SpO2: 97% 99%    Last Pain:  Vitals:   01/21/24 1330  TempSrc:   PainSc: 0-No pain                 [name],E. Louisa Favaro

## 2024-01-21 NOTE — Anesthesia Procedure Notes (Signed)
 Procedure Name: Intubation Date/Time: 01/21/2024 11:25 AM  Performed by: Roslynn Waddell LABOR, CRNAPre-anesthesia Checklist: Patient identified, Emergency Drugs available, Suction available and Patient being monitored Patient Re-evaluated:Patient Re-evaluated prior to induction Oxygen Delivery Method: Circle System Utilized Preoxygenation: Pre-oxygenation with 100% oxygen Induction Type: IV induction Ventilation: Mask ventilation without difficulty Tube type: Oral Tube size: 8.5 mm Number of attempts: 1 Airway Equipment and Method: Stylet and Oral airway Placement Confirmation: ETT inserted through vocal cords under direct vision, positive ETCO2 and breath sounds checked- equal and bilateral Secured at: 21 cm Tube secured with: Tape Dental Injury: Teeth and Oropharynx as per pre-operative assessment  Comments: CRNA DL with Mac 3 blade, but unable to get a good enough view. Switched to GS Pro and able to easily intubate. MDA mask ventilated while switching blades

## 2024-01-21 NOTE — Op Note (Signed)
 Video Bronchoscopy with Endobronchial Ultrasound and Electromagnetic Navigation Procedure Note  Date of Operation: 01/21/2024  Pre-op Diagnosis: Left lower lobe nodule, mediastinal adenopathy  Post-op Diagnosis: Same  Surgeon: LAMAR CHRIS  Assistants: None  Anesthesia: General endotracheal anesthesia  Operation: Flexible video fiberoptic bronchoscopy with endobronchial ultrasound, robotic assisted navigation and biopsies.  Estimated Blood Loss: Minimal  Complications: None apparent  Indications and History: Andrea Newton is a 68 y.o. female never smoker with hypertension, GERD.  She was felt to have a mixed density left lower lobe pulmonary nodule on coronary calcium CT chest, confirmed on subsequent imaging.  Recommendation was made to achieve a tissue diagnosis using endobronchial ultrasound and robotic assisted navigational bronchoscopy. The risks, benefits, complications, treatment options and expected outcomes were discussed with the patient.  The possibilities of pneumothorax, pneumonia, reaction to medication, pulmonary aspiration, perforation of a viscus, bleeding, failure to diagnose a condition and creating a complication requiring transfusion or operation were discussed with the patient who freely signed the consent.    Description of Procedure: The patient was seen in the Preoperative Area, was examined and was deemed appropriate to proceed.  The patient was taken to [facility] Endoscopy room 3, identified as [place] FOR[place] and the procedure verified as Flexible Video Fiberoptic Bronchoscopy with robotic assisted navigation and endobronchial ultrasound.  A Time Out was held and the above information confirmed.   Robotic assisted navigation: Prior to the date of the procedure a high-resolution CT scan of the chest was performed. Utilizing ION software program a virtual tracheobronchial tree was generated to allow the creation of distinct navigation pathways to the  patient's parenchymal abnormalities. After being taken to the operating room general anesthesia was initiated and the patient  was orally intubated. The video fiberoptic bronchoscope was introduced via the endotracheal tube and a general inspection was performed which showed normal right and left lung anatomy. Aspiration of the bilateral mainstems was completed to remove any remaining secretions. Robotic catheter inserted into patient's endotracheal tube.   Target #1 left lower lobe mixed density pulmonary nodule: The distinct navigation pathways prepared prior to this procedure were then utilized to navigate to patient's lesion identified on CT scan. The robotic catheter was secured into place and the vision probe was withdrawn.  Lesion location was approximated using fluoroscopy.  Local registration and targeting was performed using [employer] Cios mobile C-arm three-dimensional imaging. Under fluoroscopic guidance transbronchial needle brushings, transbronchial needle biopsies, and transbronchial cryoprobe biopsies were performed to be sent for cytology and pathology.  Needle-in-lesion was confirmed using Cios mobile C-arm.  A bronchioalveolar lavage was performed in the left lower lobe adjacent to the nodule and sent for cytology.     Endobronchial ultrasound: The robotic scope was then withdrawn and the endobronchial ultrasound was used to identify and characterize the peritracheal, hilar and bronchial lymph nodes. Inspection showed nodes ranging from 0.25-0.50 cm at stations 4R, 7, 4L.  There was a 1.0 cm node at station 12L.SABRA Using real-time ultrasound guidance Wang needle biopsies were take from Station 4R, 7, 4L, 12L nodes and were sent for cytology.   At the end of the procedure a general airway inspection was performed and there was no evidence of active bleeding. The bronchoscope was removed.  The patient tolerated the procedure well. There was no significant blood loss and there were  no obvious complications. A post-procedural chest x-ray is pending.  Samples Target #1: 1. Transbronchial needle brushings from left lower lobe nodule 2. Transbronchial Wang needle  biopsies from left lower lobe nodule 3. Transbronchial cryoprobe biopsies from left lower lobe nodule 4. Bronchoalveolar lavage from left lower lobe   EBUS Samples: 1. Wang needle biopsies from 4R node 2. Wang needle biopsies from 7 node 3. Wang needle biopsies from 4L node 4. Wang needle biopsies from 12L node   Lamar Chris, MD, PhD 01/21/2024, 12:45 PM [facility]

## 2024-01-23 ENCOUNTER — Encounter (HOSPITAL_COMMUNITY): Payer: Self-pay | Admitting: Emergency Medicine

## 2024-01-23 LAB — CYTOLOGY - NON PAP

## 2024-01-25 ENCOUNTER — Encounter: Payer: Self-pay | Admitting: Family Medicine

## 2024-01-28 ENCOUNTER — Ambulatory Visit: Admitting: Acute Care

## 2024-01-29 ENCOUNTER — Telehealth: Payer: Self-pay

## 2024-01-29 DIAGNOSIS — C3492 Malignant neoplasm of unspecified part of left bronchus or lung: Secondary | ICD-10-CM

## 2024-01-29 NOTE — Telephone Encounter (Signed)
 Copied from CRM [id number]. Topic: Appointments - Appointment Cancel/Reschedule >> Jan 25, 2024  5:22 PM Andrea Newton wrote: Patient/patient representative is calling to cancel or reschedule an appointment. Refer to attachments for appointment information.  Patient is calling to reschedule from 12/15 for Bronch f/u. PAS unable to schedule with NP Groce. Please call patient to move appointment to a sooner time with correct provider. >> Jan 29, 2024  3:06 PM Nurse Kelly BRAVO, RN wrote: Can this patient be scheduled with Dr. Shelah. He has openings and held spots this week. [facility] has a full schedule.  >> Jan 29, 2024  2:50 PM Zebedee NOVAK wrote: [name] we are needing a spot to put this follow up on they were on 01/28/24 and it was cancelled Bronch was 12/8 >> Jan 29, 2024  9:33 AM [name] wrote: [facility] doesn't have anything soon. Not sure if double booking is an option.     ATC x1 to trying schedule PFT. No answer- left message to call back to schedule.  Front staff can you please schedule this pts PFT. Openings 1/5 for PFT.

## 2024-01-29 NOTE — Telephone Encounter (Signed)
 Called and left VM for pt to schedule her with Dr. Shelah on 12/17 at 4pm, spot has been held.

## 2024-01-29 NOTE — Telephone Encounter (Signed)
 I spoke with the patient and reviewed results with her. I want to refer her to Thoracic Surgery - she agrees. Referral made  She needs to have her PFT scheduled please

## 2024-01-29 NOTE — Addendum Note (Signed)
 Addended by: SHELAH LAMAR RAMAN on: 01/29/2024 03:14 PM   Modules accepted: Orders

## 2024-01-29 NOTE — Telephone Encounter (Signed)
 I called the patient to discuss bronchoscopy results with her, but no answer.  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  Nodes were negative except for some possible atypical cells and station 7.  I want to discuss referral plans with her.  I left her message that I would call her back

## 2024-01-29 NOTE — Telephone Encounter (Signed)
 Copied from CRM #[id number]. Topic: Clinical - Lab/Test Results >> Jan 29, 2024 10:21 AM Rozanna MATSU wrote: Reason for CRM: pt is requesting to speak with someone about her bronchoscopy results she does not want to wait until Feb since her appt was cancelled.  Dr Shelah, please advise results

## 2024-01-29 NOTE — Telephone Encounter (Signed)
 Thoracic Tumor Board Review  Andrea Newton was reviewed before presentation on the on the Thoracic Tumor Board on 01/29/2024.  Andrea Newton has a personal history of lung cancer.  Andrea Newton meets National Comprehensive Cancer Network (NCCN) criteria for genetic testing for Hereditary Breast and Ovarian Cancer Syndrome based on her family history of pancreatic cancer in her father and ovarian cancer in her maternal grandmother.   Andrea Fryer, MS, CGC  Certified Genetic Counselor  Email: [email or web address]  Phone: [phone]

## 2024-01-30 NOTE — Telephone Encounter (Signed)
 Pls call pt and ask her if she would like this geneticist referral.

## 2024-01-31 ENCOUNTER — Other Ambulatory Visit: Payer: Self-pay

## 2024-01-31 ENCOUNTER — Telehealth: Payer: Self-pay

## 2024-01-31 NOTE — Addendum Note (Signed)
 Addended by: LINDSEY-MILLS, Jullian Clayson on: 01/31/2024 12:30 PM   Modules accepted: Orders

## 2024-01-31 NOTE — Addendum Note (Signed)
 Addended by: CANDISE ALEENE DEL on: 01/31/2024 11:08 AM   Modules accepted: Orders

## 2024-01-31 NOTE — Telephone Encounter (Signed)
 No further action needed.

## 2024-01-31 NOTE — Telephone Encounter (Signed)
 Spoke w/ patient Andrea Newton. Everything has been addressed     Copied from CRM #[id number]. Topic: Clinical - Lab/Test Results >> Jan 29, 2024 10:21 AM Rozanna MATSU wrote: Reason for CRM: pt is requesting to speak with someone about her bronchoscopy results she does not want to wait until Feb since her appt was cancelled. >> Jan 29, 2024  4:31 PM Rozanna MATSU wrote: Pt called back stated to go ahead and schedule the PFT for 01/05, she has her phone set and it he call did not come through. She stated if you call back she will just check mychart for the appt. I attempted to call [facility] no answer. The date was not available on my end to schedule her. Thanks

## 2024-01-31 NOTE — Telephone Encounter (Signed)
 Spoke with pt, she agreed with referral being placed.

## 2024-01-31 NOTE — Progress Notes (Signed)
 The proposed treatment discussed in conference is for discussion purpose only and is not a binding recommendation.  The patients have not been physically examined, or presented with their treatment options.  Therefore, final treatment plans cannot be decided.

## 2024-01-31 NOTE — Telephone Encounter (Signed)
 Okay, referral has been ordered

## 2024-02-05 ENCOUNTER — Ambulatory Visit (INDEPENDENT_AMBULATORY_CARE_PROVIDER_SITE_OTHER): Admitting: *Deleted

## 2024-02-05 DIAGNOSIS — R911 Solitary pulmonary nodule: Secondary | ICD-10-CM

## 2024-02-05 LAB — PULMONARY FUNCTION TEST
DL/VA % pred: 101 %
DL/VA: 4.18 ml/min/mmHg/L
DLCO cor % pred: 80 %
DLCO cor: 16.95 ml/min/mmHg
DLCO unc % pred: 78 %
DLCO unc: 16.52 ml/min/mmHg
FEF 25-75 Post: 2.95 L/s
FEF 25-75 Pre: 3.4 L/s
FEF2575-%Change-Post: -13 %
FEF2575-%Pred-Post: 134 %
FEF2575-%Pred-Pre: 154 %
FEV1-%Change-Post: -2 %
FEV1-%Pred-Post: 95 %
FEV1-%Pred-Pre: 97 %
FEV1-Post: 2.46 L
FEV1-Pre: 2.51 L
FEV1FVC-%Change-Post: 3 %
FEV1FVC-%Pred-Pre: 110 %
FEV6-%Change-Post: -5 %
FEV6-%Pred-Post: 85 %
FEV6-%Pred-Pre: 91 %
FEV6-Post: 2.79 L
FEV6-Pre: 2.96 L
FEV6FVC-%Change-Post: 0 %
FEV6FVC-%Pred-Post: 104 %
FEV6FVC-%Pred-Pre: 104 %
FVC-%Change-Post: -5 %
FVC-%Pred-Post: 82 %
FVC-%Pred-Pre: 87 %
FVC-Post: 2.79 L
FVC-Pre: 2.96 L
Post FEV1/FVC ratio: 88 %
Post FEV6/FVC ratio: 100 %
Pre FEV1/FVC ratio: 85 %
Pre FEV6/FVC Ratio: 100 %
RV % pred: 67 %
RV: 1.49 L
TLC % pred: 82 %
TLC: 4.42 L

## 2024-02-05 NOTE — Patient Instructions (Signed)
 Full PFT performed today.

## 2024-02-05 NOTE — Progress Notes (Signed)
 Full PFT performed today.

## 2024-02-15 ENCOUNTER — Ambulatory Visit
Attending: Thoracic Surgery (Cardiothoracic Vascular Surgery) | Admitting: Thoracic Surgery (Cardiothoracic Vascular Surgery)

## 2024-02-15 ENCOUNTER — Other Ambulatory Visit: Payer: Self-pay | Admitting: *Deleted

## 2024-02-15 ENCOUNTER — Encounter: Payer: Self-pay | Admitting: *Deleted

## 2024-02-15 VITALS — BP 139/87 | HR 93 | Resp 18 | Ht 65.0 in | Wt 183.0 lb

## 2024-02-15 DIAGNOSIS — R911 Solitary pulmonary nodule: Secondary | ICD-10-CM

## 2024-02-15 NOTE — H&P (View-Only) (Signed)
 "     [address] [place] [address]             [phone]                   Andrea Newton [facility] Medical Record #[id number] Date of Birth: 1956-01-07  Referring: Shelah Lamar RAMAN, MD Primary Care: Candise Aleene DEL, MD Primary Cardiologist: None  Chief Complaint:    Chief Complaint  Patient presents with   Lung Cancer    New patient consult, review all studies    History of Present Illness:    Andrea Newton is a 69 y.o. female who presents for surgical evaluation of a biopsy-proven left lower lobe adenocarcinoma.  This was found incidentally on her coronary calcium score CT.  She is a lifetime non-smoker.  She is asymptomatic.     Zubrod Score: At the time of surgery this patients most appropriate activity status/level should be described as: [x]     0    Normal activity, no symptoms []     1    Restricted in physical strenuous activity but ambulatory, able to do out light work []     2    Ambulatory and capable of self care, unable to do work activities, up and about               >50 % of waking hours                              []     3    Only limited self care, in bed greater than 50% of waking hours []     4    Completely disabled, no self care, confined to bed or chair []     5    Moribund     Past Medical History:  Diagnosis Date   Bleeding internal hemorrhoids 2016   Borderline hypercholesterolemia 2019   10 yr [place] cv risk = 4.5%. 10/2020 [facility] cv risk=6%.  October 2025 risk 8%.   Constipation    Essential hypertension    GERD (gastroesophageal reflux disease)    History of adenomatous polyp of colon 04/2014   x 1: recall 5 yrs ([facility])   Infectious mononucleosis hepatitis college   Mild intermittent asthma    Multiple thyroid  nodules 2010 approx; 2015   Euthyroid.  Saw ENDO (Dr. Aura).  Repeat thyroid  u/s 12/2013 showed thyromegaly with multiple nodules meeting biopsy criteria--sent pt to Dr. Trixie  and plan for repeat u/s (09/2014) and one nodule had grown so bx -->benign.  Labs 03/2014 showed elevted TPO ab, supportive of Hashimoto's thyroiditis--a reassuring finding.  Pt euthyroid. 11/2018 u/s->stable. Annual TFT's with me, 2 yr f/u Dr. Paschal   Osteoarthritis    knees>hips.  Takes no meds.   Osteopenia 2017; 2019; 03/2020   Stable DEXA results 2017-2019. T score -2.18 Mar 2020. Rpt 2024.   Postmenopausal status 04/2010   FSH and LH elevated, estrogen low.   Pulmonary nodule    3 cm-->pulm ref (11/2023)   Recurrent UTI     Past Surgical History:  Procedure Laterality Date   BIOPSY THYROID   11/05/2014   Scant follicular epithelium: benign   BRONCHIAL NEEDLE ASPIRATION BIOPSY  01/21/2024   + adenocarcinoma. Procedure: BRONCHOSCOPY, WITH NEEDLE ASPIRATION BIOPSY;  Surgeon: Shelah Lamar RAMAN, MD;  Location: [facility] ENDOSCOPY;  Service: Pulmonary;;   BRONCHIAL WASHINGS  01/21/2024   Procedure: IRRIGATION, BRONCHUS;  Surgeon: Shelah Lamar RAMAN, MD;  Location: [facility] ENDOSCOPY;  Service: Pulmonary;;   Cervical cancer screening  03/11/2013   No hx of abnormals.  Neg pap and Neg HR HPV testing: next pap should be after 03/11/2018 and should include HR HPV testing as well.   COLONOSCOPY W/ POLYPECTOMY  05/2011; 04/2014   IH and 'tics (Dr. Shana w/ [facility]).  Polyp 04/2014= tubular adenoma (recall 5 yrs)     Coronary calcium score     October 2025, 60th percentile   CRYOTHERAPY  01/21/2024   Procedure: CRYOTHERAPY;  Surgeon: Shelah Lamar RAMAN, MD;  Location[facility] END[facility]COPY;  Service: Pulmonary;;   DEXA  05/2015; 07/2017; 03/18/2020   T score -2.1: osteopenia, fracture risk not high enough to recommend anything other than calcium and vit D.  June 2019: T-score -2.1. 03/2020 T score -2.3. Plan rpt 2024.   ENDOMETRIAL BIOPSY  2006   Performed b/c of abnormal uterine bleeding: normal (fragmented INACTIVE endometrium).  [name], [name].   EYE SURGERY  lasik 2002   HEMORRHOID  BANDING  April and May 2016   [facility]   JOINT REPLACEMENT  01/29/2023   right knee   LASIK  2002   VIDEO BRONCHOSCOPY WITH ENDOBRONCHIAL NAVIGATION Left 01/21/2024   Procedure: VIDEO BRONCHOSCOPY WITH ENDOBRONCHIAL NAVIGATION;  Surgeon: Shelah Lamar RAMAN, MD;  Location: [facility] ENDOSCOPY;  Service: Pulmonary;  Laterality: Left;  EBUS scope available also   VIDEO BRONCHOSCOPY WITH ENDOBRONCHIAL ULTRASOUND N/A 01/21/2024   Procedure: BRONCHOSCOPY, WITH EBUS;  Surgeon: Shelah Lamar RAMAN, MD;  Location: [facility] ENDOSCOPY;  Service: Pulmonary;  Laterality: N/A;    Family History  Problem Relation Age of Onset   Arthritis Mother        Osteo (bilat hip replacements)   Cancer Mother    Stroke Mother    Heart disease Mother    Clotting disorder Mother    Pancreatic cancer Father    Stroke Father    Vision loss Father    Ovarian cancer Maternal Grandmother        ovarian   Heart disease Maternal Grandmother    Stroke Maternal Grandmother    ADD / ADHD Son    Asthma Son    Diabetes Maternal Aunt      Tobacco Use History[1]  Social History   Substance and Sexual Activity  Alcohol Use Yes   Alcohol/week: 2.0 standard drinks of alcohol   Types: 2 Glasses of wine per week   Comment: socially     Allergies[2]  Current Outpatient Medications  Medication Sig Dispense Refill   albuterol  (VENTOLIN  HFA) 108 (90 Base) MCG/ACT inhaler Inhale 2 puffs into the lungs every 6 (six) hours as needed for wheezing or shortness of breath. 8 g 0   Ascorbic Acid (VITAMIN C) 500 MG PACK      Calcium Carbonate (CALCIUM 600 PO) Take 600 mg by mouth daily. Take 1-2  tablets once daily.     Ciclopirox  1 % shampoo Apply qod 120 mL 3   ELIDEL 1 % cream as needed.     erythromycin  ophthalmic ointment Apply thin ribbon to lower lashline of right lower eyelid three times a day for 5d 1 g 0   Famotidine (PEPCID PO) Take by mouth as needed.     hydrochlorothiazide  (HYDRODIURIL ) 25 MG tablet Take 1 tablet (25 mg  total) by mouth daily. 90 tablet 3   loratadine (CLARITIN) 10 MG tablet Take  10 mg by mouth daily.     Multiple Vitamins-Minerals (PRESERVISION AREDS 2+MULTI VIT PO)      nitrofurantoin , macrocrystal-monohydrate, (MACROBID ) 100 MG capsule Take 1 capsule (100 mg total) by mouth 2 (two) times daily. 14 capsule 0   OVER THE COUNTER MEDICATION TURMERIC WITH BLACK PEPPER 1000MG      pantoprazole  (PROTONIX ) 40 MG tablet Take 1 tablet (40 mg total) by mouth daily. 90 tablet 3   phentermine (ADIPEX-P) 37.5 MG tablet Take 37.5 mg by mouth daily.     saccharomyces boulardii (FLORASTOR) 250 MG capsule Take by mouth daily.     zolpidem  (AMBIEN ) 5 MG tablet TAKE 1 TABLET(5 MG) BY MOUTH AT BEDTIME AS NEEDED 90 tablet 1   No current facility-administered medications for this visit.    Review of Systems  Constitutional:  Negative for malaise/fatigue.  Respiratory:  Negative for cough and shortness of breath.   Cardiovascular:  Negative for chest pain.  Neurological: Negative.      PHYSICAL EXAMINATION: BP 139/87 (BP Location: Right Arm)   Pulse 93   Resp 18   Ht 5' 5 (1.651 m)   Wt 183 lb (83 kg)   LMP 02/13/2010   SpO2 95%   BMI 30.45 kg/m  Physical Exam Constitutional:      General: She is not in acute distress.    Appearance: She is not ill-appearing.  Eyes:     Extraocular Movements: Extraocular movements intact.  Cardiovascular:     Rate and Rhythm: Normal rate.  Musculoskeletal:        General: Normal range of motion.     Cervical back: Normal range of motion.  Skin:    General: Skin is warm and dry.  Neurological:     General: No focal deficit present.     Mental Status: She is alert and oriented to person, place, and time.          I have independently reviewed the above radiology studies  and reviewed the findings with the patient.   Recent Lab Findings: Lab Results  Component Value Date   WBC 5.3 01/21/2024   HGB 12.6 01/21/2024   HCT 39.5 01/21/2024   PLT 257  01/21/2024   GLUCOSE 105 (H) 01/21/2024   CHOL 224 (H) 11/29/2023   TRIG 97.0 11/29/2023   HDL 54.60 11/29/2023   LDLCALC 150 (H) 11/29/2023   ALT 13 09/06/2023   AST 13 09/06/2023   NA 140 01/21/2024   K 3.9 01/21/2024   CL 103 01/21/2024   CREATININE 0.69 01/21/2024   BUN 14 01/21/2024   CO2 25 01/21/2024   TSH 2.39 09/06/2023   HGBA1C 5.5 11/29/2023    Diagnostic Studies & Laboratory data:     Recent Radiology Findings:   NM PET Image Initial (PI) Skull Base To Thigh Result Date: 01/23/2024 CLINICAL DATA:  Initial treatment strategy for left lower lobe pulmonary mass. EXAM: NUCLEAR MEDICINE PET SKULL BASE TO THIGH TECHNIQUE: 9.04 mCi F-18 FDG was injected intravenously. Full-ring PET imaging was performed from the skull base to thigh after the radiotracer. CT data was obtained and used for attenuation correction and anatomic localization. Fasting blood glucose: 96 mg/dl COMPARISON:  Chest CT 87/97/7974 FINDINGS: Mediastinal blood pool activity: SUV max 2.7 Liver activity: SUV max NA NECK: No hypermetabolic lymph nodes in the neck. Incidental CT findings: Multinodular thyroid  goiter but no hypermetabolic thyroid  lesions are identified. This has been evaluated on previous imaging. (ref: J Am Coll Radiol. 2015 Feb;12(2): 143-50). CHEST: The  large part solid left lower lobe lung lesion demonstrates hypermetabolism with SUV max of 2.9. Findings highly suspicious for adenocarcinoma. 8.5 mm AP window node demonstrates low level hypermetabolism with SUV max of 3.5. 8 mm right paratracheal node has an SUV max of 2.7. 12 mm subcarinal node has an SUV max of 3.7. Findings are indeterminate. These could represent inflamed/reactive lymph nodes, particularly given the patient's underlying lung disease/pulmonary fibrosis. No hypermetabolic breast masses, supraclavicular or axillary adenopathy. Incidental CT findings: Stable changes of interstitial lung disease. No pleural effusions. Large hiatal hernia  noted. ABDOMEN/PELVIS: No abnormal hypermetabolic activity within the liver, pancreas, adrenal glands, or spleen. No hypermetabolic lymph nodes in the abdomen or pelvis. Incidental CT findings: Multiple hepatic cysts are noted. Bilateral renal calculi. No worrisome hepatic or adrenal gland lesions. Age advanced vascular calcifications most notably involving the lower aorta and bilateral iliac arteries. Moderate sigmoid colon diverticulosis. Pessary ring noted in the vagina. Suspect uterine fibroids. SKELETON: No findings suspicious for osseous metastatic disease. Incidental CT findings: None. IMPRESSION: 1. The large part solid left lower lobe lung lesion is hypermetabolic and highly suspicious for adenocarcinoma. 2. Mildly hypermetabolic mediastinal lymph nodes are indeterminate. These could be inflammatory/reactive given the patient's underlying lung disease/pulmonary fibrosis. 3. No findings for metastatic disease involving the abdomen/pelvis or bony structures. 4. Age advanced vascular calcifications. 5. Aortic atherosclerosis. Aortic Atherosclerosis (ICD10-I70.0). Electronically Signed   By: MYRTIS Stammer M.D.   On: 01/23/2024 14:41   DG Chest Port 1 View Result Date: 01/21/2024 CLINICAL DATA:  Status post bronchoscopy EXAM: PORTABLE CHEST 1 VIEW COMPARISON:  Chest radiograph dated 01/13/2021 FINDINGS: Low lung volumes with bronchovascular crowding. Patchy left lower lung opacity. Diffuse interstitial opacity, likely reflecting pulmonary fibrosis. Trace blunting of the bilateral costophrenic angles. No pneumothorax. Enlarged cardiomediastinal silhouette is likely projectional. No acute osseous abnormality. IMPRESSION: 1. Low lung volumes with bronchovascular crowding. Patchy left lower lung opacity, likely a combination of postprocedural change and atelectasis. Known left lower lobe pulmonary nodule is obscured. 2. No pneumothorax. Trace blunting of the bilateral costophrenic angles, which may represent  trace pleural effusions. Electronically Signed   By: Limin  Xu M.D.   On: 01/21/2024 14:02   DG C-ARM BRONCHOSCOPY Result Date: 01/21/2024 C-ARM BRONCHOSCOPY: Fluoroscopy was utilized by the requesting physician.  No radiographic interpretation.   DG C-Arm 1-60 Min-No Report Result Date: 01/21/2024 Fluoroscopy was utilized by the requesting physician.  No radiographic interpretation.     PFTs:  - FVC: 87% - FEV1: 97% -DLCO: 80%  FINAL MICROSCOPIC DIAGNOSIS:  A. LUNG, LLL, FINE NEEDLE ASPIRATION:  Adenocarcinoma  See comment   B. LUNG, LLL, BRUSHING:  Adenocarcinoma    Assessment / Plan:   69 y.o. 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  She also had some avidity on PET/CT involving her hilar and mediastinal lymph nodes, but these were all biopsied and negative.  Based on these findings I think that she would be a good candidate for surgical resection.  We discussed the risks and benefits of a left robotic assisted thoracoscopy with left lower lobectomy.  She is agreeable to proceed.    LLLEctomy I  spent 40 minutes with  the patient face to face in counseling and coordination of care.    Linnie MALVA Rayas 02/15/2024 1:14 PM           [1]  Social History Tobacco Use  Smoking Status Never  Smokeless Tobacco Never  [2]  Allergies Allergen Reactions   Penicillins Rash   "

## 2024-02-15 NOTE — Progress Notes (Signed)
 "     [address] [place] [address]             [phone]                   Andrea Newton [facility] Medical Record #[id number] Date of Birth: 1956-01-07  Referring: Andrea Lamar RAMAN, MD Primary Care: Andrea Aleene DEL, MD Primary Cardiologist: None  Chief Complaint:    Chief Complaint  Patient presents with   Lung Cancer    New patient consult, review all studies    History of Present Illness:    Andrea Newton is a 69 y.o. female who presents for surgical evaluation of a biopsy-proven left lower lobe adenocarcinoma.  This was found incidentally on her coronary calcium score CT.  She is a lifetime non-smoker.  She is asymptomatic.     Zubrod Score: At the time of surgery this patients most appropriate activity status/level should be described as: [x]     0    Normal activity, no symptoms []     1    Restricted in physical strenuous activity but ambulatory, able to do out light work []     2    Ambulatory and capable of self care, unable to do work activities, up and about               >50 % of waking hours                              []     3    Only limited self care, in bed greater than 50% of waking hours []     4    Completely disabled, no self care, confined to bed or chair []     5    Moribund     Past Medical History:  Diagnosis Date   Bleeding internal hemorrhoids 2016   Borderline hypercholesterolemia 2019   10 yr [place] cv risk = 4.5%. 10/2020 fmhm cv risk=6%.  October 2025 risk 8%.   Constipation    Essential hypertension    GERD (gastroesophageal reflux disease)    History of adenomatous polyp of colon 04/2014   x 1: recall 5 yrs ([facility])   Infectious mononucleosis hepatitis college   Mild intermittent asthma    Multiple thyroid  nodules 2010 approx; 2015   Euthyroid.  Saw ENDO (Andrea Newton).  Repeat thyroid  u/s 12/2013 showed thyromegaly with multiple nodules meeting biopsy criteria--sent pt to Andrea Newton  and plan for repeat u/s (09/2014) and one nodule had grown so bx -->benign.  Labs 03/2014 showed elevted TPO ab, supportive of Hashimoto's thyroiditis--a reassuring finding.  Pt euthyroid. 11/2018 u/s->stable. Annual TFT's with me, 2 yr f/u Andrea Newton   Osteoarthritis    knees>hips.  Takes no meds.   Osteopenia 2017; 2019; 03/2020   Stable DEXA results 2017-2019. T score -2.18 Mar 2020. Rpt 2024.   Postmenopausal status 04/2010   FSH and LH elevated, estrogen low.   Pulmonary nodule    3 cm-->pulm ref (11/2023)   Recurrent UTI     Past Surgical History:  Procedure Laterality Date   BIOPSY THYROID   11/05/2014   Scant follicular epithelium: benign   BRONCHIAL NEEDLE ASPIRATION BIOPSY  01/21/2024   + adenocarcinoma. Procedure: BRONCHOSCOPY, WITH NEEDLE ASPIRATION BIOPSY;  Surgeon: Andrea Lamar RAMAN, MD;  Location: [facility] ENDOSCOPY;  Service: Pulmonary;;   BRONCHIAL WASHINGS  01/21/2024   Procedure: IRRIGATION, BRONCHUS;  Surgeon: Andrea Lamar RAMAN, MD;  Location: [facility] ENDOSCOPY;  Service: Pulmonary;;   Cervical cancer screening  03/11/2013   No hx of abnormals.  Neg pap and Neg HR HPV testing: next pap should be after 03/11/2018 and should include HR HPV testing as well.   COLONOSCOPY W/ POLYPECTOMY  05/2011; 04/2014   IH and 'tics (Dr. Shana w/ [facility]).  Polyp 04/2014= tubular adenoma (recall 5 yrs)     Coronary calcium score     October 2025, 60th percentile   CRYOTHERAPY  01/21/2024   Procedure: CRYOTHERAPY;  Surgeon: Andrea Lamar RAMAN, MD;  Location[facility] END[facility]COPY;  Service: Pulmonary;;   DEXA  05/2015; 07/2017; 03/18/2020   T score -2.1: osteopenia, fracture risk not high enough to recommend anything other than calcium and vit D.  June 2019: T-score -2.1. 03/2020 T score -2.3. Plan rpt 2024.   ENDOMETRIAL BIOPSY  2006   Performed b/c of abnormal uterine bleeding: normal (fragmented INACTIVE endometrium).  [name], [name].   EYE SURGERY  lasik 2002   HEMORRHOID  BANDING  April and May 2016   [facility]   JOINT REPLACEMENT  01/29/2023   right knee   LASIK  2002   VIDEO BRONCHOSCOPY WITH ENDOBRONCHIAL NAVIGATION Left 01/21/2024   Procedure: VIDEO BRONCHOSCOPY WITH ENDOBRONCHIAL NAVIGATION;  Surgeon: Andrea Lamar RAMAN, MD;  Location: [facility] ENDOSCOPY;  Service: Pulmonary;  Laterality: Left;  EBUS scope available also   VIDEO BRONCHOSCOPY WITH ENDOBRONCHIAL ULTRASOUND N/A 01/21/2024   Procedure: BRONCHOSCOPY, WITH EBUS;  Surgeon: Andrea Lamar RAMAN, MD;  Location: [facility] ENDOSCOPY;  Service: Pulmonary;  Laterality: N/A;    Family History  Problem Relation Age of Onset   Arthritis Mother        Osteo (bilat hip replacements)   Cancer Mother    Stroke Mother    Heart disease Mother    Clotting disorder Mother    Pancreatic cancer Father    Stroke Father    Vision loss Father    Ovarian cancer Maternal Grandmother        ovarian   Heart disease Maternal Grandmother    Stroke Maternal Grandmother    ADD / ADHD Son    Asthma Son    Diabetes Maternal Aunt      Tobacco Use History[1]  Social History   Substance and Sexual Activity  Alcohol Use Yes   Alcohol/week: 2.0 standard drinks of alcohol   Types: 2 Glasses of wine per week   Comment: socially     Allergies[2]  Current Outpatient Medications  Medication Sig Dispense Refill   albuterol  (VENTOLIN  HFA) 108 (90 Base) MCG/ACT inhaler Inhale 2 puffs into the lungs every 6 (six) hours as needed for wheezing or shortness of breath. 8 g 0   Ascorbic Acid (VITAMIN C) 500 MG PACK      Calcium Carbonate (CALCIUM 600 PO) Take 600 mg by mouth daily. Take 1-2  tablets once daily.     Ciclopirox  1 % shampoo Apply qod 120 mL 3   ELIDEL 1 % cream as needed.     erythromycin  ophthalmic ointment Apply thin ribbon to lower lashline of right lower eyelid three times a day for 5d 1 g 0   Famotidine (PEPCID PO) Take by mouth as needed.     hydrochlorothiazide  (HYDRODIURIL ) 25 MG tablet Take 1 tablet (25 mg  total) by mouth daily. 90 tablet 3   loratadine (CLARITIN) 10 MG tablet Take  10 mg by mouth daily.     Multiple Vitamins-Minerals (PRESERVISION AREDS 2+MULTI VIT PO)      nitrofurantoin , macrocrystal-monohydrate, (MACROBID ) 100 MG capsule Take 1 capsule (100 mg total) by mouth 2 (two) times daily. 14 capsule 0   OVER THE COUNTER MEDICATION TURMERIC WITH BLACK PEPPER 1000MG      pantoprazole  (PROTONIX ) 40 MG tablet Take 1 tablet (40 mg total) by mouth daily. 90 tablet 3   phentermine (ADIPEX-P) 37.5 MG tablet Take 37.5 mg by mouth daily.     saccharomyces boulardii (FLORASTOR) 250 MG capsule Take by mouth daily.     zolpidem  (AMBIEN ) 5 MG tablet TAKE 1 TABLET(5 MG) BY MOUTH AT BEDTIME AS NEEDED 90 tablet 1   No current facility-administered medications for this visit.    Review of Systems  Constitutional:  Negative for malaise/fatigue.  Respiratory:  Negative for cough and shortness of breath.   Cardiovascular:  Negative for chest pain.  Neurological: Negative.      PHYSICAL EXAMINATION: BP 139/87 (BP Location: Right Arm)   Pulse 93   Resp 18   Ht 5' 5 (1.651 m)   Wt 183 lb (83 kg)   LMP 02/13/2010   SpO2 95%   BMI 30.45 kg/m  Physical Exam Constitutional:      General: She is not in acute distress.    Appearance: She is not ill-appearing.  Eyes:     Extraocular Movements: Extraocular movements intact.  Cardiovascular:     Rate and Rhythm: Normal rate.  Musculoskeletal:        General: Normal range of motion.     Cervical back: Normal range of motion.  Skin:    General: Skin is warm and dry.  Neurological:     General: No focal deficit present.     Mental Status: She is alert and oriented to person, place, and time.          I have independently reviewed the above radiology studies  and reviewed the findings with the patient.   Recent Lab Findings: Lab Results  Component Value Date   WBC 5.3 01/21/2024   HGB 12.6 01/21/2024   HCT 39.5 01/21/2024   PLT 257  01/21/2024   GLUCOSE 105 (H) 01/21/2024   CHOL 224 (H) 11/29/2023   TRIG 97.0 11/29/2023   HDL 54.60 11/29/2023   LDLCALC 150 (H) 11/29/2023   ALT 13 09/06/2023   AST 13 09/06/2023   NA 140 01/21/2024   K 3.9 01/21/2024   CL 103 01/21/2024   CREATININE 0.69 01/21/2024   BUN 14 01/21/2024   CO2 25 01/21/2024   TSH 2.39 09/06/2023   HGBA1C 5.5 11/29/2023    Diagnostic Studies & Laboratory data:     Recent Radiology Findings:   NM PET Image Initial (PI) Skull Base To Thigh Result Date: 01/23/2024 CLINICAL DATA:  Initial treatment strategy for left lower lobe pulmonary mass. EXAM: NUCLEAR MEDICINE PET SKULL BASE TO THIGH TECHNIQUE: 9.04 mCi F-18 FDG was injected intravenously. Full-ring PET imaging was performed from the skull base to thigh after the radiotracer. CT data was obtained and used for attenuation correction and anatomic localization. Fasting blood glucose: 96 mg/dl COMPARISON:  Chest CT 87/97/7974 FINDINGS: Mediastinal blood pool activity: SUV max 2.7 Liver activity: SUV max NA NECK: No hypermetabolic lymph nodes in the neck. Incidental CT findings: Multinodular thyroid  goiter but no hypermetabolic thyroid  lesions are identified. This has been evaluated on previous imaging. (ref: J Am Coll Radiol. 2015 Feb;12(2): 143-50). CHEST: The  large part solid left lower lobe lung lesion demonstrates hypermetabolism with SUV max of 2.9. Findings highly suspicious for adenocarcinoma. 8.5 mm AP window node demonstrates low level hypermetabolism with SUV max of 3.5. 8 mm right paratracheal node has an SUV max of 2.7. 12 mm subcarinal node has an SUV max of 3.7. Findings are indeterminate. These could represent inflamed/reactive lymph nodes, particularly given the patient's underlying lung disease/pulmonary fibrosis. No hypermetabolic breast masses, supraclavicular or axillary adenopathy. Incidental CT findings: Stable changes of interstitial lung disease. No pleural effusions. Large hiatal hernia  noted. ABDOMEN/PELVIS: No abnormal hypermetabolic activity within the liver, pancreas, adrenal glands, or spleen. No hypermetabolic lymph nodes in the abdomen or pelvis. Incidental CT findings: Multiple hepatic cysts are noted. Bilateral renal calculi. No worrisome hepatic or adrenal gland lesions. Age advanced vascular calcifications most notably involving the lower aorta and bilateral iliac arteries. Moderate sigmoid colon diverticulosis. Pessary ring noted in the vagina. Suspect uterine fibroids. SKELETON: No findings suspicious for osseous metastatic disease. Incidental CT findings: None. IMPRESSION: 1. The large part solid left lower lobe lung lesion is hypermetabolic and highly suspicious for adenocarcinoma. 2. Mildly hypermetabolic mediastinal lymph nodes are indeterminate. These could be inflammatory/reactive given the patient's underlying lung disease/pulmonary fibrosis. 3. No findings for metastatic disease involving the abdomen/pelvis or bony structures. 4. Age advanced vascular calcifications. 5. Aortic atherosclerosis. Aortic Atherosclerosis (ICD10-I70.0). Electronically Signed   By: MYRTIS Stammer M.D.   On: 01/23/2024 14:41   DG Chest Port 1 View Result Date: 01/21/2024 CLINICAL DATA:  Status post bronchoscopy EXAM: PORTABLE CHEST 1 VIEW COMPARISON:  Chest radiograph dated 01/13/2021 FINDINGS: Low lung volumes with bronchovascular crowding. Patchy left lower lung opacity. Diffuse interstitial opacity, likely reflecting pulmonary fibrosis. Trace blunting of the bilateral costophrenic angles. No pneumothorax. Enlarged cardiomediastinal silhouette is likely projectional. No acute osseous abnormality. IMPRESSION: 1. Low lung volumes with bronchovascular crowding. Patchy left lower lung opacity, likely a combination of postprocedural change and atelectasis. Known left lower lobe pulmonary nodule is obscured. 2. No pneumothorax. Trace blunting of the bilateral costophrenic angles, which may represent  trace pleural effusions. Electronically Signed   By: Limin  Xu M.D.   On: 01/21/2024 14:02   DG C-ARM BRONCHOSCOPY Result Date: 01/21/2024 C-ARM BRONCHOSCOPY: Fluoroscopy was utilized by the requesting physician.  No radiographic interpretation.   DG C-Arm 1-60 Min-No Report Result Date: 01/21/2024 Fluoroscopy was utilized by the requesting physician.  No radiographic interpretation.     PFTs:  - FVC: 87% - FEV1: 97% -DLCO: 80%  FINAL MICROSCOPIC DIAGNOSIS:  A. LUNG, LLL, FINE NEEDLE ASPIRATION:  Adenocarcinoma  See comment   B. LUNG, LLL, BRUSHING:  Adenocarcinoma    Assessment / Plan:   69 y.o. 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  She also had some avidity on PET/CT involving her hilar and mediastinal lymph nodes, but these were all biopsied and negative.  Based on these findings I think that she would be a good candidate for surgical resection.  We discussed the risks and benefits of a left robotic assisted thoracoscopy with left lower lobectomy.  She is agreeable to proceed.    LLLEctomy I  spent 40 minutes with  the patient face to face in counseling and coordination of care.    Andrea Newton 02/15/2024 1:14 PM           [1]  Social History Tobacco Use  Smoking Status Never  Smokeless Tobacco Never  [2]  Allergies Allergen Reactions   Penicillins Rash   "

## 2024-02-20 NOTE — Progress Notes (Signed)
 Surgical Instructions   Your procedure is scheduled on Monday February 25, 2024. Report to [facility] Main Entrance A at 8:30 A.M., then check in with the Admitting office. Any questions or running late day of surgery: call [phone]  Questions prior to your surgery date: call [phone], Monday-Friday, 8am-4pm. If you experience any cold or flu symptoms such as cough, fever, chills, shortness of breath, etc. between now and your scheduled surgery, please notify us  at the above number.     Remember:  Do not eat or drink after midnight the night before your surgery  Take these medicines the morning of surgery with A SIP OF WATER  loratadine (CLARITIN)  pantoprazole  (PROTONIX )   May take these medicines IF NEEDED:None   Please hold your phentermine (ADIPEX-P) effective immediately   One week prior to surgery, STOP taking any Aspirin (unless otherwise instructed by your surgeon) Aleve, Naproxen, Ibuprofen, Motrin, Advil, Goody's, BC's, all herbal medications, fish oil, and non-prescription vitamins.                     Do NOT Smoke (Tobacco/Vaping) for 24 hours prior to your procedure.  If you use a CPAP at night, you may bring your mask/headgear for your overnight stay.   You will be asked to remove any contacts, glasses, piercing's, hearing aid's, dentures/partials prior to surgery. Please bring cases for these items if needed.    Patients discharged the day of surgery will not be allowed to drive home, and someone needs to stay with them for 24 hours.  SURGICAL WAITING ROOM VISITATION Patients may have no more than 2 support people in the waiting area - these visitors may rotate.   Pre-op nurse will coordinate an appropriate time for 1 ADULT support person, who may not rotate, to accompany patient in pre-op.  Children under the age of 46 must have an adult with them who is not the patient and must remain in the main waiting area with an adult.  If the patient needs to  stay at the hospital during part of their recovery, the visitor guidelines for inpatient rooms apply.  Please refer to the [facility] website for the visitor guidelines for any additional information.   If you received a COVID test during your pre-op visit  it is requested that you wear a mask when out in public, stay away from anyone that may not be feeling well and notify your surgeon if you develop symptoms. If you have been in contact with anyone that has tested positive in the last 10 days please notify you surgeon.      Pre-operative CHG Bathing Instructions   You can play a key role in reducing the risk of infection after surgery. Your skin needs to be as free of germs as possible. You can reduce the number of germs on your skin by washing with CHG (chlorhexidine  gluconate) soap before surgery. CHG is an antiseptic soap that kills germs and continues to kill germs even after washing.   DO NOT use if you have an allergy to chlorhexidine /CHG or antibacterial soaps. If your skin becomes reddened or irritated, stop using the CHG and notify one of our RNs at [phone].              TAKE A SHOWER THE NIGHT BEFORE SURGERY   Please keep in mind the following:  DO NOT shave, including legs and underarms, 48 hours prior to surgery.   Place clean sheets on your bed the  night before surgery Use a clean washcloth (not used since being washed) for shower. DO NOT sleep with pet's night before surgery.  CHG Shower Instructions:  Wash your face and private area with normal soap. If you choose to wash your hair, wash first with your normal shampoo.  After you use shampoo/soap, rinse your hair and body thoroughly to remove shampoo/soap residue.  Turn the water OFF and apply half the bottle of CHG soap to a CLEAN washcloth.  Apply CHG soap ONLY FROM YOUR NECK DOWN TO YOUR TOES (washing for 3-5 minutes)  DO NOT use CHG soap on face, private areas, open wounds, or sores.  Pay special attention to  the area where your surgery is being performed.  If you are having back surgery, having someone wash your back for you may be helpful. Wait 2 minutes after CHG soap is applied, then you may rinse off the CHG soap.  Pat dry with a clean towel  Put on clean pajamas    Additional instructions for the day of surgery: If you choose, you may shower the morning of surgery with an antibacterial soap.  DO NOT APPLY any lotions, deodorants or perfumes.   Do not wear jewelry or makeup Do not wear nail polish, gel polish, artificial nails, or any other type of covering on natural nails (fingers and toes) Do not bring valuables to the hospital. [facility] is not responsible for valuables/personal belongings. Put on clean/comfortable clothes.  Please brush your teeth.  Ask your nurse before applying any prescription medications to the skin.

## 2024-02-21 ENCOUNTER — Encounter (HOSPITAL_COMMUNITY): Payer: Self-pay

## 2024-02-21 ENCOUNTER — Other Ambulatory Visit: Payer: Self-pay

## 2024-02-21 ENCOUNTER — Ambulatory Visit (HOSPITAL_COMMUNITY)
Admission: RE | Admit: 2024-02-21 | Discharge: 2024-02-21 | Disposition: A | Discharge status: Home / Self Care | Source: Ambulatory Visit | Attending: Thoracic Surgery (Cardiothoracic Vascular Surgery) | Admitting: Thoracic Surgery (Cardiothoracic Vascular Surgery)

## 2024-02-21 ENCOUNTER — Encounter (HOSPITAL_COMMUNITY)
Admission: RE | Admit: 2024-02-21 | Discharge: 2024-02-21 | Disposition: A | Discharge status: Home / Self Care | Source: Ambulatory Visit | Attending: Thoracic Surgery (Cardiothoracic Vascular Surgery) | Admitting: Thoracic Surgery (Cardiothoracic Vascular Surgery)

## 2024-02-21 VITALS — BP 147/74 | HR 75 | Temp 98.6°F | Resp 18 | Ht 65.0 in | Wt 190.0 lb

## 2024-02-21 DIAGNOSIS — R911 Solitary pulmonary nodule: Secondary | ICD-10-CM | POA: Diagnosis not present

## 2024-02-21 DIAGNOSIS — Z01818 Encounter for other preprocedural examination: Secondary | ICD-10-CM

## 2024-02-21 DIAGNOSIS — R918 Other nonspecific abnormal finding of lung field: Secondary | ICD-10-CM | POA: Insufficient documentation

## 2024-02-21 DIAGNOSIS — I498 Other specified cardiac arrhythmias: Secondary | ICD-10-CM | POA: Diagnosis not present

## 2024-02-21 HISTORY — DX: Localized osteoporosis (Lequesne): M81.6

## 2024-02-21 LAB — COMPREHENSIVE METABOLIC PANEL WITH GFR
ALT: 27 U/L (ref 0–44)
AST: 26 U/L (ref 15–41)
Albumin: 4.4 g/dL (ref 3.5–5.0)
Alkaline Phosphatase: 97 U/L (ref 38–126)
Anion gap: 9 (ref 5–15)
BUN: 15 mg/dL (ref 8–23)
CO2: 30 mmol/L (ref 22–32)
Calcium: 9.5 mg/dL (ref 8.9–10.3)
Chloride: 101 mmol/L (ref 98–111)
Creatinine, Ser: 0.65 mg/dL (ref 0.44–1.00)
GFR, Estimated: >60 mL/min
Glucose, Bld: 99 mg/dL (ref 70–99)
Potassium: 4 mmol/L (ref 3.5–5.1)
Sodium: 140 mmol/L (ref 135–145)
Total Bilirubin: 0.3 mg/dL (ref 0.0–1.2)
Total Protein: 7.4 g/dL (ref 6.5–8.1)

## 2024-02-21 LAB — CBC
HCT: 41.2 % (ref 36.0–46.0)
Hemoglobin: 13.4 g/dL (ref 12.0–15.0)
MCH: 30.2 pg (ref 26.0–34.0)
MCHC: 32.5 g/dL (ref 30.0–36.0)
MCV: 93 fL (ref 80.0–100.0)
Platelets: 236 K/uL (ref 150–400)
RBC: 4.43 MIL/uL (ref 3.87–5.11)
RDW: 13.2 % (ref 11.5–15.5)
WBC: 7.3 K/uL (ref 4.0–10.5)
nRBC: 0 % (ref 0.0–0.2)

## 2024-02-21 LAB — URINALYSIS, ROUTINE W REFLEX MICROSCOPIC
Bilirubin Urine: NEGATIVE
Glucose, UA: NEGATIVE mg/dL
Hgb urine dipstick: NEGATIVE
Ketones, ur: NEGATIVE mg/dL
Nitrite: NEGATIVE
Protein, ur: NEGATIVE mg/dL
Specific Gravity, Urine: 1.009 (ref 1.005–1.030)
pH: 6 (ref 5.0–8.0)

## 2024-02-21 LAB — TYPE AND SCREEN
ABO/RH(D): O POS
Antibody Screen: NEGATIVE

## 2024-02-21 LAB — SURGICAL PCR SCREEN
MRSA, PCR: NEGATIVE
Staphylococcus aureus: NEGATIVE

## 2024-02-21 LAB — PROTIME-INR
INR: 1 (ref 0.8–1.2)
Prothrombin Time: 13.3 s (ref 11.4–15.2)

## 2024-02-21 LAB — APTT: aPTT: 29 s (ref 24–36)

## 2024-02-21 NOTE — Progress Notes (Addendum)
 PCP - Dr. Manus Hockey Cardiologist - denies Pulmonologist - Dr Shelah  PPM/ICD - denies Device Orders - n/a Rep Notified - n/a  Chest x-ray - 02/21/24 - ok to complete at PAT per Bernardino Sprang EKG - 02/21/24 Stress Test - denies ECHO - denies Cardiac Cath - denies  Sleep Study - denies CPAP - n/a  No DM  Last dose of GLP1 agonist-   n/a GLP1 instructions:  n/a  Blood Thinner Instructions: n/a Aspirin Instructions: n/a  ERAS Protcol - NPO PRE-SURGERY Ensure or G2- n/a  COVID TEST- n/a   Anesthesia review: EKG -   Patient denies shortness of breath, fever, cough and chest pain at PAT appointment   All instructions explained to the patient, with a verbal understanding of the material. Patient agrees to go over the instructions while at home for a better understanding. Patient also instructed to self quarantine after being tested for COVID-19. The opportunity to ask questions was provided.  Last dose of Adipex was 02/18/24.  Notified Bernardino Sprang of UA results

## 2024-02-22 NOTE — Progress Notes (Signed)
 Anesthesia Chart Review:  Case: [id number] Date/Time: 02/25/24 1022   Procedure: LOBECTOMY, LUNG, ROBOT-ASSISTED, USING VATS (Left: Chest) - LEFT ROBOTIC LEFT LOWER LOBECTOMY   Anesthesia type: General   Diagnosis: Left lower lobe pulmonary nodule [R91.1]   Pre-op diagnosis: LLL NODULE   [facility]: [facility] ROOM 10 / [facility]   Surgeons: Shyrl Linnie KIDD, MD       DISCUSSION: Patient is a 69 year old female scheduled for the above procedure. She had an incidental finding of LLL lung nodule on recent coronary calcium score CT.  She is s/p bronchoscopy with LLL biopsies on 01/21/2024. FNA + adenocarcinoma.   History includes never smoker, left lung adenocarcinoma, thyroid  nodules (03/2014, labs suggestive of Hashimoto's thyroiditis) GERD, hypercholesterolemia, HTN, asthma, osteoarthritis (right TKA 01/29/2023).  Dr. Shyrl classified her Zubrod score is 0: Normal activity, no symptoms. CAC 15 (60th percentile) in 11/2023.  Preoperative EKG and labs noted. Anesthesia team to evaluate on the day of surgery.  VS: BP (!) 147/74   Pulse 75   Temp 37 C   Resp 18   Ht 5' 5 (1.651 m)   Wt 86.2 kg   LMP 02/13/2010   SpO2 97%   BMI 31.62 kg/m    PROVIDERS: Candise Aleene DEL, MD is PCP  Shelah Charleston, MD is pulmonologist   LABS: Labs reviewed: Acceptable for surgery. (all labs ordered are listed, but only abnormal results are displayed)  Labs Reviewed  URINALYSIS, ROUTINE W REFLEX MICROSCOPIC - Abnormal; Notable for the following components:      Result Value   Color, Urine STRAW (*)    Leukocytes,Ua TRACE (*)    Bacteria, UA RARE (*)    All other components within normal limits  SURGICAL PCR SCREEN  CBC  COMPREHENSIVE METABOLIC PANEL WITH GFR  PROTIME-INR  APTT  TYPE AND SCREEN    IMAGES: CXR 02/21/2024: Report in process.  PET Scan 01/18/2024: IMPRESSION: 1. The large part solid left lower lobe lung lesion is hypermetabolic and highly suspicious for adenocarcinoma. 2.  Mildly hypermetabolic mediastinal lymph nodes are indeterminate. These could be inflammatory/reactive given the patient's underlying lung disease/pulmonary fibrosis. 3. No findings for metastatic disease involving the abdomen/pelvis or bony structures. 4. Age advanced vascular calcifications. 5. Aortic atherosclerosis.   CT Super D Chest 01/15/2024: MPRESSION: 1. Subsolid subpleural left lower lobe lesion measuring 3.1 x 2.4 cm with possible bronchiolar infiltration and scattered inferior satellite nodules up to 0.8 x 0.7 cm; recommend multidisciplinary evaluation with consideration of PET/CT and/or tissue sampling given size and morphology. 2. Bilateral thyroid  nodules up to 1.7 cm; recommend non-emergent thyroid  ultrasound based on size criteria and impression of us  thryoid 11/15/2018. 3. No gross hilar adenopathy with limited evaluation on this noncontrast study. 4. Other, non-acute and/or normal findings as above.   US  Thyroid  11/15/2018: IMPRESSION: 1. Diffusely enlarged, heterogeneous and multinodular thyroid  gland most consistent with multinodular goiter. 2. Slight interval growth of right mid and left inferior TI-RADS category 3 nodules (labeled # 2 and # 8 above) compared to prior imaging from August of 2016. Minimal enlargement over 4 years is of uncertain clinical significance. At the current size and imaging characteristics, neither nodule meets criteria for biopsy. Consider continued surveillance. 3. The previously biopsied nodule in the left superior gland demonstrates no significant interval change. 4. Numerous additional bilateral thyroid  nodules which do not meet criteria for biopsy or further imaging surveillance. - The above is in keeping with the ACR TI-RADS recommendations - J Am Coll Radiol 2017;14:587-595.  EKG: Normal sinus rhythm with sinus arrhythmia  Minimal voltage criteria for LVH, may be normal variant ( Cornell product )  Nonspecific T wave  abnormality  Abnormal ECG  - When compared with ECG of 21-Jan-2024 08:55, Lead II is flat (question lead placement), otherwise I think tracing appears stable when compared to 01/21/2024 EKG.    CV: CT Cardiac Calcium Scoring 10/202/2025: FINDINGS: Coronary Calcium Score: Left main: 0 Left anterior descending artery: 7 Left circumflex artery: 9 Right coronary artery: 0 Total: 15 Percentile: 60   Ascending Aorta: Normal caliber. Aortic atherosclerosis. Aortic Valve Calcium score: 17 Mitral annular calcification: 0 Pericardium: Normal.   IMPRESSION: 1. Coronary calcium score of 15. This was 60th percentile for age-,race-, and sex-matched controls. RECOMMENDATIONS:.. If CAC is 1 to 20, it is reasonable to initiate statin therapy for patients >=103 years of age...    Past Medical History:  Diagnosis Date   Bleeding internal hemorrhoids 2016   Borderline hypercholesterolemia 2019   10 yr Fr[place]ham cv risk = 4.5%. 10/2020 fmhm cv risk=6%.  October 2025 risk 8%.   Constipation    Essential hypertension    GERD (gastroesophageal reflux disease)    History of adenomatous polyp of colon 04/2014   x 1: recall 5 yrs ([facility])   Infectious mononucleosis hepatitis college   Localized osteoporosis of spine    Mild intermittent asthma    Multiple thyroid  nodules 2010 approx; 2015   Euthyroid.  Saw ENDO (Dr. Aura).  Repeat thyroid  u/s 12/2013 showed thyromegaly with multiple nodules meeting biopsy criteria--sent pt to Dr. Trixie and plan for repeat u/s (09/2014) and one nodule had grown so bx -->benign.  Labs 03/2014 showed elevted TPO ab, supportive of Hashimoto's thyroiditis--a reassuring finding.  Pt euthyroid. 11/2018 u/s->stable. Annual TFT's with me, 2 yr f/u Dr. Paschal   Osteoarthritis    knees>hips.  Takes no meds.   Osteopenia 2017; 2019; 03/2020   Stable DEXA results 2017-2019. T score -2.18 Mar 2020. Rpt 2024.   Postmenopausal status 04/2010   FSH and LH elevated, estrogen  low.   Pulmonary nodule    3 cm-->pulm ref (11/2023)   Recurrent UTI     Past Surgical History:  Procedure Laterality Date   BIOPSY THYROID   11/05/2014   Scant follicular epithelium: benign   BRONCHIAL NEEDLE ASPIRATION BIOPSY  01/21/2024   + adenocarcinoma. Procedure: BRONCHOSCOPY, WITH NEEDLE ASPIRATION BIOPSY;  Surgeon: Shelah Lamar RAMAN, MD;  [facility] END[facility]COPY;  Service: Pulmonary;;   BRONCHIAL WASHINGS  01/21/2024   Procedure: IRRIGATION, BRONCHUS;  Surgeon: Shelah Lamar RAMAN, MD;  Location: [facility] ENDOSCOPY;  Service: Pulmonary;;   Cervical cancer screening  03/11/2013   No hx of abnormals.  Neg pap and Neg HR HPV testing: next pap should be after 03/11/2018 and should include HR HPV testing as well.   COLONOSCOPY W/ POLYPECTOMY  05/2011; 04/2014   IH and 'tics (Dr. Shana w/ [facility]).  Polyp 04/2014= tubular adenoma (recall 5 yrs)     Coronary calcium score     October 2025, 60th percentile   CRYOTHERAPY  01/21/2024   Procedure: CRYOTHERAPY;  Surgeon: Shelah Lamar RAMAN, MD;  Location: [facility] ENDOSCOPY;  Service: Pulmonary;;   DEXA  05/2015; 07/2017; 03/18/2020   T score -2.1: osteopenia, fracture risk not high enough to recommend anything other than calcium and vit D.  June 2019: T-score -2.1. 03/2020 T score -2.3. Plan rpt 2024.   ENDOMETRIAL BIOPSY  2006   Performed b/c of abnormal  uterine bleeding: normal (fragmented INACTIVE endometrium).  [facility] [place], [name].   EYE SURGERY  lasik 2002   HEMORRHOID BANDING  April and May 2016   [facility]   JOINT REPLACEMENT  01/29/2023   right knee   LASIK  2002   VIDEO BRONCHOSCOPY WITH ENDOBRONCHIAL NAVIGATION Left 01/21/2024   Procedure: VIDEO BRONCHOSCOPY WITH ENDOBRONCHIAL NAVIGATION;  Surgeon: Shelah Lamar RAMAN, MD;  Location: [facility] ENDOSCOPY;  Service: Pulmonary;  Laterality: Left;  EBUS scope available also   VIDEO BRONCHOSCOPY WITH ENDOBRONCHIAL ULTRASOUND N/A 01/21/2024   Procedure: BRONCHOSCOPY, WITH EBUS;   Surgeon: Shelah Lamar RAMAN, MD;  Location: [facility] ENDOSCOPY;  Service: Pulmonary;  Laterality: N/A;    MEDICATIONS:  albuterol  (VENTOLIN  HFA) 108 (90 Base) MCG/ACT inhaler   Calcium Carbonate (CALCIUM 600 PO)   Ciclopirox  1 % shampoo   ELIDEL 1 % cream   erythromycin  ophthalmic ointment   hydrochlorothiazide  (HYDRODIURIL ) 25 MG tablet   loratadine (CLARITIN) 10 MG tablet   Multiple Vitamins-Minerals (PRESERVISION AREDS 2+MULTI VIT PO)   nitrofurantoin , macrocrystal-monohydrate, (MACROBID ) 100 MG capsule   pantoprazole  (PROTONIX ) 40 MG tablet   phentermine (ADIPEX-P) 37.5 MG tablet   saccharomyces boulardii (FLORASTOR) 250 MG capsule   zolpidem  (AMBIEN ) 5 MG tablet   No current facility-administered medications for this encounter.   She is on Macrobid  BID for UTI.   Last phentermine 02/18/2024.   Isaiah Ruder, PA-C Surgical Short Stay/Anesthesiology [facility] Phone [phone] [facility] Phone [phone] 02/22/2024 10:30 AM

## 2024-02-22 NOTE — Anesthesia Preprocedure Evaluation (Signed)
"                                    Anesthesia Evaluation  Patient identified by MRN, date of birth, ID band Patient awake    Reviewed: Allergy & Precautions, NPO status , Patient's Chart, lab work & pertinent test results  History of Anesthesia Complications Negative for: history of anesthetic complications  Airway Mallampati: III  TM Distance: >3 FB Neck ROM: Full    Dental  (+) Dental Advisory Given, Teeth Intact   Pulmonary asthma   Pulmonary nodules    Pulmonary exam normal        Cardiovascular hypertension, Pt. on medications Normal cardiovascular exam   Obesity    Neuro/Psych negative neurological ROS  negative psych ROS   GI/Hepatic Neg liver ROS,GERD  Medicated and Controlled,,  Endo/Other   Obesity   Renal/GU negative Renal ROS     Musculoskeletal  (+) Arthritis , Osteoarthritis,    Abdominal   Peds  Hematology negative hematology ROS (+)   Anesthesia Other Findings   Reproductive/Obstetrics                              Anesthesia Physical Anesthesia Plan  ASA: 2  Anesthesia Plan: General   Post-op Pain Management: Tylenol  PO (pre-op)*   Induction: Intravenous  PONV Risk Score and Plan: 3 and Treatment may vary due to age or medical condition, Ondansetron  and Dexamethasone   Airway Management Planned: Double Lumen EBT  Additional Equipment: ClearSight  Intra-op Plan:   Post-operative Plan: Extubation in OR  Informed Consent: I have reviewed the patients History and Physical, chart, labs and discussed the procedure including the risks, benefits and alternatives for the proposed anesthesia with the patient or authorized representative who has indicated his/her understanding and acceptance.     Dental advisory given  Plan Discussed with: CRNA and Anesthesiologist  Anesthesia Plan Comments: (PAT note written 02/22/2024 by Isaiah Ruder, PA-C. 2 PIV, [employer])         Anesthesia  Quick Evaluation  "

## 2024-02-25 ENCOUNTER — Inpatient Hospital Stay (HOSPITAL_COMMUNITY)
Admission: RE | Admit: 2024-02-25 | Discharge: 2024-02-26 | DRG: 165 | Disposition: A | Discharge status: Home / Self Care | Attending: Thoracic Surgery (Cardiothoracic Vascular Surgery) | Admitting: Thoracic Surgery (Cardiothoracic Vascular Surgery)

## 2024-02-25 ENCOUNTER — Encounter (HOSPITAL_COMMUNITY)
Admission: RE | Disposition: A | Payer: Self-pay | Source: Home / Self Care | Attending: Thoracic Surgery (Cardiothoracic Vascular Surgery)

## 2024-02-25 ENCOUNTER — Encounter (HOSPITAL_COMMUNITY): Payer: Self-pay | Admitting: Thoracic Surgery (Cardiothoracic Vascular Surgery)

## 2024-02-25 ENCOUNTER — Inpatient Hospital Stay (HOSPITAL_COMMUNITY): Payer: Self-pay | Admitting: Anesthesiology

## 2024-02-25 ENCOUNTER — Inpatient Hospital Stay (HOSPITAL_COMMUNITY)

## 2024-02-25 ENCOUNTER — Encounter (HOSPITAL_COMMUNITY): Payer: Self-pay | Admitting: Vascular Surgery

## 2024-02-25 DIAGNOSIS — Z8041 Family history of malignant neoplasm of ovary: Secondary | ICD-10-CM | POA: Diagnosis not present

## 2024-02-25 DIAGNOSIS — M1712 Unilateral primary osteoarthritis, left knee: Secondary | ICD-10-CM | POA: Diagnosis present

## 2024-02-25 DIAGNOSIS — Z96651 Presence of right artificial knee joint: Secondary | ICD-10-CM | POA: Diagnosis present

## 2024-02-25 DIAGNOSIS — M160 Bilateral primary osteoarthritis of hip: Secondary | ICD-10-CM | POA: Diagnosis present

## 2024-02-25 DIAGNOSIS — Z825 Family history of asthma and other chronic lower respiratory diseases: Secondary | ICD-10-CM

## 2024-02-25 DIAGNOSIS — Z832 Family history of diseases of the blood and blood-forming organs and certain disorders involving the immune mechanism: Secondary | ICD-10-CM | POA: Diagnosis not present

## 2024-02-25 DIAGNOSIS — Z860101 Personal history of adenomatous and serrated colon polyps: Secondary | ICD-10-CM

## 2024-02-25 DIAGNOSIS — Z833 Family history of diabetes mellitus: Secondary | ICD-10-CM | POA: Diagnosis not present

## 2024-02-25 DIAGNOSIS — J4520 Mild intermittent asthma, uncomplicated: Secondary | ICD-10-CM | POA: Diagnosis present

## 2024-02-25 DIAGNOSIS — Z8261 Family history of arthritis: Secondary | ICD-10-CM | POA: Diagnosis not present

## 2024-02-25 DIAGNOSIS — C3492 Malignant neoplasm of unspecified part of left bronchus or lung: Secondary | ICD-10-CM

## 2024-02-25 DIAGNOSIS — I10 Essential (primary) hypertension: Secondary | ICD-10-CM | POA: Diagnosis present

## 2024-02-25 DIAGNOSIS — C3432 Malignant neoplasm of lower lobe, left bronchus or lung: Secondary | ICD-10-CM | POA: Diagnosis present

## 2024-02-25 DIAGNOSIS — Z800 Family history of malignant neoplasm of digestive organs: Secondary | ICD-10-CM | POA: Diagnosis not present

## 2024-02-25 DIAGNOSIS — Z823 Family history of stroke: Secondary | ICD-10-CM

## 2024-02-25 DIAGNOSIS — Z79899 Other long term (current) drug therapy: Secondary | ICD-10-CM

## 2024-02-25 DIAGNOSIS — R911 Solitary pulmonary nodule: Secondary | ICD-10-CM | POA: Diagnosis present

## 2024-02-25 DIAGNOSIS — Z821 Family history of blindness and visual loss: Secondary | ICD-10-CM

## 2024-02-25 DIAGNOSIS — Z902 Acquired absence of lung [part of]: Principal | ICD-10-CM

## 2024-02-25 DIAGNOSIS — Z8249 Family history of ischemic heart disease and other diseases of the circulatory system: Secondary | ICD-10-CM

## 2024-02-25 DIAGNOSIS — K219 Gastro-esophageal reflux disease without esophagitis: Secondary | ICD-10-CM | POA: Diagnosis present

## 2024-02-25 HISTORY — PX: LOBECTOMY, LUNG, ROBOT-ASSISTED, USING VATS: SHX7607

## 2024-02-25 HISTORY — PX: LYMPH NODE BIOPSY: SHX201

## 2024-02-25 HISTORY — PX: INTERCOSTAL NERVE BLOCK: SHX5021

## 2024-02-25 LAB — ABO/RH: ABO/RH(D): O POS

## 2024-02-25 MED ORDER — PROPOFOL 10 MG/ML IV BOLUS
INTRAVENOUS | Status: AC
  Filled 2024-02-25: qty 20

## 2024-02-25 MED ORDER — LACTATED RINGERS IV SOLN: INTRAVENOUS | Status: DC | PRN

## 2024-02-25 MED ORDER — BUPIVACAINE HCL (PF) 0.5 % IJ SOLN
INTRAMUSCULAR | Status: AC
  Filled 2024-02-25: qty 30

## 2024-02-25 MED ORDER — MIDAZOLAM HCL 2 MG/2ML IJ SOLN
INTRAMUSCULAR | Status: AC
  Filled 2024-02-25: qty 2

## 2024-02-25 MED ORDER — LIDOCAINE 2% (20 MG/ML) 5 ML SYRINGE
INTRAMUSCULAR | Status: AC
  Filled 2024-02-25: qty 5

## 2024-02-25 MED ORDER — DEXAMETHASONE SOD PHOSPHATE PF 10 MG/ML IJ SOLN
INTRAMUSCULAR | Status: AC
  Filled 2024-02-25: qty 1

## 2024-02-25 MED ORDER — BUPIVACAINE LIPOSOME 1.3 % IJ SUSP
INTRAMUSCULAR | Status: AC
  Filled 2024-02-25: qty 20

## 2024-02-25 MED ORDER — HYDROMORPHONE HCL 1 MG/ML IJ SOLN
INTRAMUSCULAR | Status: AC
  Filled 2024-02-25: qty 1

## 2024-02-25 MED ORDER — ONDANSETRON HCL 4 MG/2ML IJ SOLN
INTRAMUSCULAR | Status: DC | PRN
  Administered 2024-02-25: 4 mg via INTRAVENOUS

## 2024-02-25 MED ORDER — FENTANYL CITRATE (PF) 100 MCG/2ML IJ SOLN
25.0000 ug | INTRAMUSCULAR | Status: DC | PRN
  Administered 2024-02-25: 50 ug via INTRAVENOUS
  Administered 2024-02-25 (×4): 25 ug via INTRAVENOUS

## 2024-02-25 MED ORDER — VANCOMYCIN HCL IN DEXTROSE 1-5 GM/200ML-% IV SOLN: 1000.0000 mg | INTRAVENOUS | Status: AC

## 2024-02-25 MED ORDER — FENTANYL CITRATE (PF) 100 MCG/2ML IJ SOLN
INTRAMUSCULAR | Status: AC
  Filled 2024-02-25: qty 2

## 2024-02-25 MED ORDER — ACETAMINOPHEN 500 MG PO TABS
1000.0000 mg | ORAL_TABLET | Freq: Four times a day (QID) | ORAL | Status: DC
  Administered 2024-02-25 – 2024-02-26 (×4): 1000 mg via ORAL
  Filled 2024-02-25 (×4): qty 2

## 2024-02-25 MED ORDER — PHENYLEPHRINE 80 MCG/ML (10ML) SYRINGE FOR IV PUSH (FOR BLOOD PRESSURE SUPPORT)
PREFILLED_SYRINGE | INTRAVENOUS | Status: AC
  Filled 2024-02-25: qty 10

## 2024-02-25 MED ORDER — VANCOMYCIN HCL IN DEXTROSE 1-5 GM/200ML-% IV SOLN
INTRAVENOUS | Status: AC
  Administered 2024-02-25: 1000 mg via INTRAVENOUS
  Filled 2024-02-25: qty 200

## 2024-02-25 MED ORDER — ACETAMINOPHEN 500 MG PO TABS
1000.0000 mg | ORAL_TABLET | Freq: Once | ORAL | Status: AC
  Administered 2024-02-25: 1000 mg via ORAL
  Filled 2024-02-25: qty 2

## 2024-02-25 MED ORDER — BISACODYL 5 MG PO TBEC
10.0000 mg | DELAYED_RELEASE_TABLET | Freq: Every day | ORAL | Status: DC
  Administered 2024-02-25 – 2024-02-26 (×2): 10 mg via ORAL
  Filled 2024-02-25 (×2): qty 2

## 2024-02-25 MED ORDER — KETAMINE HCL 50 MG/5ML IJ SOSY
PREFILLED_SYRINGE | INTRAMUSCULAR | Status: AC
  Filled 2024-02-25: qty 5

## 2024-02-25 MED ORDER — ENOXAPARIN SODIUM 40 MG/0.4ML IJ SOSY
40.0000 mg | PREFILLED_SYRINGE | Freq: Every day | INTRAMUSCULAR | Status: DC
  Administered 2024-02-26: 40 mg via SUBCUTANEOUS
  Filled 2024-02-25: qty 0.4

## 2024-02-25 MED ORDER — MORPHINE SULFATE (PF) 2 MG/ML IV SOLN
2.0000 mg | INTRAVENOUS | Status: DC | PRN
  Administered 2024-02-25: 2 mg via INTRAVENOUS
  Filled 2024-02-25: qty 1

## 2024-02-25 MED ORDER — ONDANSETRON HCL 4 MG/2ML IJ SOLN: 4.0000 mg | Freq: Four times a day (QID) | INTRAMUSCULAR | Status: DC | PRN

## 2024-02-25 MED ORDER — 0.9 % SODIUM CHLORIDE (POUR BTL) OPTIME
TOPICAL | Status: DC | PRN
  Administered 2024-02-25: 1000 mL

## 2024-02-25 MED ORDER — SODIUM CHLORIDE (PF) 0.9 % IJ SOLN
INTRAMUSCULAR | Status: AC
  Filled 2024-02-25: qty 50

## 2024-02-25 MED ORDER — FENTANYL CITRATE (PF) 250 MCG/5ML IJ SOLN
INTRAMUSCULAR | Status: AC
  Filled 2024-02-25: qty 5

## 2024-02-25 MED ORDER — ROCURONIUM BROMIDE 10 MG/ML (PF) SYRINGE
PREFILLED_SYRINGE | INTRAVENOUS | Status: AC
  Filled 2024-02-25: qty 10

## 2024-02-25 MED ORDER — DEXAMETHASONE SOD PHOSPHATE PF 10 MG/ML IJ SOLN
INTRAMUSCULAR | Status: DC | PRN
  Administered 2024-02-25: 10 mg via INTRAVENOUS

## 2024-02-25 MED ORDER — KETAMINE HCL 50 MG/5ML IJ SOSY
PREFILLED_SYRINGE | INTRAMUSCULAR | Status: DC | PRN
  Administered 2024-02-25: 20 mg via INTRAVENOUS
  Administered 2024-02-25: 10 mg via INTRAVENOUS
  Administered 2024-02-25: 20 mg via INTRAVENOUS

## 2024-02-25 MED ORDER — ONDANSETRON HCL 4 MG/2ML IJ SOLN
INTRAMUSCULAR | Status: AC
  Filled 2024-02-25: qty 2

## 2024-02-25 MED ORDER — SENNOSIDES-DOCUSATE SODIUM 8.6-50 MG PO TABS
1.0000 | ORAL_TABLET | Freq: Every day | ORAL | Status: DC
  Administered 2024-02-25: 1 via ORAL
  Filled 2024-02-25: qty 1

## 2024-02-25 MED ORDER — GABAPENTIN 300 MG PO CAPS: 300.0000 mg | ORAL_CAPSULE | Freq: Two times a day (BID) | ORAL | Status: DC

## 2024-02-25 MED ORDER — SUGAMMADEX SODIUM 200 MG/2ML IV SOLN
INTRAVENOUS | Status: DC | PRN
  Administered 2024-02-25: 200 mg via INTRAVENOUS

## 2024-02-25 MED ORDER — ACETAMINOPHEN 160 MG/5ML PO SOLN: 1000.0000 mg | Freq: Four times a day (QID) | ORAL | Status: DC

## 2024-02-25 MED ORDER — OXYCODONE HCL 5 MG/5ML PO SOLN: 5.0000 mg | Freq: Once | ORAL | Status: DC | PRN

## 2024-02-25 MED ORDER — LACTATED RINGERS IV SOLN: INTRAVENOUS | Status: DC

## 2024-02-25 MED ORDER — HYDROMORPHONE HCL 1 MG/ML IJ SOLN
0.2500 mg | INTRAMUSCULAR | Status: DC | PRN
  Administered 2024-02-25: 0.25 mg via INTRAVENOUS
  Administered 2024-02-25: 0.5 mg via INTRAVENOUS
  Administered 2024-02-25: 0.25 mg via INTRAVENOUS

## 2024-02-25 MED ORDER — ONDANSETRON HCL 4 MG/2ML IJ SOLN: 4.0000 mg | Freq: Once | INTRAMUSCULAR | Status: DC | PRN

## 2024-02-25 MED ORDER — FENTANYL CITRATE (PF) 250 MCG/5ML IJ SOLN
INTRAMUSCULAR | Status: DC | PRN
  Administered 2024-02-25: 150 ug via INTRAVENOUS
  Administered 2024-02-25: 100 ug via INTRAVENOUS

## 2024-02-25 MED ORDER — OXYCODONE HCL 5 MG PO TABS
5.0000 mg | ORAL_TABLET | ORAL | Status: DC | PRN
  Administered 2024-02-25 – 2024-02-26 (×4): 10 mg via ORAL
  Filled 2024-02-25 (×4): qty 2

## 2024-02-25 MED ORDER — CEFAZOLIN SODIUM-DEXTROSE 2-4 GM/100ML-% IV SOLN
2.0000 g | Freq: Three times a day (TID) | INTRAVENOUS | Status: AC
  Administered 2024-02-25 – 2024-02-26 (×2): 2 g via INTRAVENOUS
  Filled 2024-02-25 (×2): qty 100

## 2024-02-25 MED ORDER — SODIUM CHLORIDE FLUSH 0.9 % IV SOLN
INTRAVENOUS | Status: DC | PRN
  Administered 2024-02-25: 92 mL

## 2024-02-25 MED ORDER — CHLORHEXIDINE GLUCONATE 0.12 % MT SOLN
OROMUCOSAL | Status: AC
  Administered 2024-02-25: 15 mL via OROMUCOSAL
  Filled 2024-02-25: qty 15

## 2024-02-25 MED ORDER — PROPOFOL 10 MG/ML IV BOLUS
INTRAVENOUS | Status: DC | PRN
  Administered 2024-02-25: 150 mg via INTRAVENOUS

## 2024-02-25 MED ORDER — ORAL CARE MOUTH RINSE: 15.0000 mL | Freq: Once | OROMUCOSAL | Status: AC

## 2024-02-25 MED ORDER — KETOROLAC TROMETHAMINE 15 MG/ML IJ SOLN
15.0000 mg | Freq: Four times a day (QID) | INTRAMUSCULAR | Status: DC
  Administered 2024-02-25 – 2024-02-26 (×4): 15 mg via INTRAVENOUS
  Filled 2024-02-25 (×4): qty 1

## 2024-02-25 MED ORDER — ALBUTEROL SULFATE (2.5 MG/3ML) 0.083% IN NEBU: 2.5000 mg | INHALATION_SOLUTION | Freq: Four times a day (QID) | RESPIRATORY_TRACT | Status: DC | PRN

## 2024-02-25 MED ORDER — CHLORHEXIDINE GLUCONATE 0.12 % MT SOLN: 15.0000 mL | Freq: Once | OROMUCOSAL | Status: AC

## 2024-02-25 MED ORDER — LIDOCAINE 2% (20 MG/ML) 5 ML SYRINGE
INTRAMUSCULAR | Status: DC | PRN
  Administered 2024-02-25: 60 mg via INTRAVENOUS

## 2024-02-25 MED ORDER — PHENYLEPHRINE 80 MCG/ML (10ML) SYRINGE FOR IV PUSH (FOR BLOOD PRESSURE SUPPORT)
PREFILLED_SYRINGE | INTRAVENOUS | Status: DC | PRN
  Administered 2024-02-25: 160 ug via INTRAVENOUS

## 2024-02-25 MED ORDER — TRAMADOL HCL 50 MG PO TABS: 50.0000 mg | ORAL_TABLET | Freq: Four times a day (QID) | ORAL | Status: DC | PRN

## 2024-02-25 MED ORDER — MIDAZOLAM HCL (PF) 2 MG/2ML IJ SOLN
INTRAMUSCULAR | Status: DC | PRN
  Administered 2024-02-25: 2 mg via INTRAVENOUS

## 2024-02-25 MED ORDER — ROCURONIUM BROMIDE 10 MG/ML (PF) SYRINGE
PREFILLED_SYRINGE | INTRAVENOUS | Status: DC | PRN
  Administered 2024-02-25: 60 mg via INTRAVENOUS
  Administered 2024-02-25: 20 mg via INTRAVENOUS
  Administered 2024-02-25: 10 mg via INTRAVENOUS
  Administered 2024-02-25: 20 mg via INTRAVENOUS

## 2024-02-25 MED ORDER — OXYCODONE HCL 5 MG PO TABS: 5.0000 mg | ORAL_TABLET | Freq: Once | ORAL | Status: DC | PRN

## 2024-02-25 MED ORDER — DEXMEDETOMIDINE HCL IN NACL 80 MCG/20ML IV SOLN
INTRAVENOUS | Status: DC | PRN
  Administered 2024-02-25 (×2): 10 ug via INTRAVENOUS

## 2024-02-25 MED ORDER — PANTOPRAZOLE SODIUM 40 MG PO TBEC
40.0000 mg | DELAYED_RELEASE_TABLET | Freq: Every day | ORAL | Status: DC
  Administered 2024-02-26: 40 mg via ORAL
  Filled 2024-02-25: qty 1

## 2024-02-25 MED ORDER — GABAPENTIN 300 MG PO CAPS
300.0000 mg | ORAL_CAPSULE | Freq: Every day | ORAL | Status: DC
  Administered 2024-02-25: 300 mg via ORAL
  Filled 2024-02-25: qty 1

## 2024-02-25 MED ORDER — PHENYLEPHRINE HCL-NACL 20-0.9 MG/250ML-% IV SOLN
INTRAVENOUS | Status: DC | PRN
  Administered 2024-02-25: 30 ug/min via INTRAVENOUS

## 2024-02-25 MED ORDER — HYDROCHLOROTHIAZIDE 25 MG PO TABS
25.0000 mg | ORAL_TABLET | Freq: Every day | ORAL | Status: DC
  Administered 2024-02-26: 25 mg via ORAL
  Filled 2024-02-25: qty 1

## 2024-02-25 NOTE — Anesthesia Postprocedure Evaluation (Signed)
"   Anesthesia Post Note  Patient: Andrea Newton  Procedure(s) Performed: LOBECTOMY, LUNG, ROBOT-ASSISTED, USING VATS (Left: Chest) LYMPH NODE BIOPSY (Left: Chest) BLOCK, NERVE, INTERCOSTAL (Left: Chest)     Patient location during evaluation: PACU Anesthesia Type: General Level of consciousness: awake and alert Pain management: satisfactory to patient Vital Signs Assessment: post-procedure vital signs reviewed and stable Respiratory status: spontaneous breathing, nonlabored ventilation, respiratory function stable and patient connected to nasal cannula oxygen Cardiovascular status: stable and blood pressure returned to baseline Anesthetic complications: no   There were no known notable events for this encounter.  Last Vitals:  Vitals:   02/25/24 1600 02/25/24 1630  BP: 119/82 130/76  Pulse: 67 75  Resp: (!) 6   Temp: 36.6 C   SpO2: 98% 95%                Debby FORBES Like      "

## 2024-02-25 NOTE — Hospital Course (Addendum)
 History of Present Illness:  Andrea Newton is a 69 yo female with known history of HTN, GERD, recurrent UTI, and thyroid  nodules.  She recently underwent calcium scoring which revealed an incidental finding of a pulmonary noudle in the left lower lobe.  Due to this she was referred to Pulmonary disease for evaluation.  She was evaluated by Dr. Shelah who recommended PET CT scan and bronchoscopy for nodule and lymph node biopsy.  PET showed the nodule to be hypermetabolic and highly suspicious for adenocarcinoma.  Bronchoscopy was performed on 12/8 and confirmed the diagnosis of Adenocarcinoma.  She was subsequently evaluated by Dr. Shyrl who recommended surgical lobectomy.  The risks and benefits of the procedure were explained to the patient and she was agreeable to proceed.  Hospital Course:  Andrea Newton presented to [facility] on 02/25/2024.  She was taken to the operating room and underwent robotic assisted left lower lobectomy, lymph node dissection, and intercostal nerve block. She tolerated the procedure well and was transferred to the PACU in stable condition. The patient did very well post operatively.  Her chest tube was managed on water seal without evidence of air leak.  Her chest tube output was low and her chest tube was removed on POD#1.  Follow up chest xray showed stable appearance of minimal apical space.  She has been resumed on home medications.  She is tolerating a diet.  Pain is under good control.  She is stable for discharge home today.

## 2024-02-25 NOTE — Transfer of Care (Signed)
 Immediate Anesthesia Transfer of Care Note  Patient: Andrea Newton  Procedure(s) Performed: LOBECTOMY, LUNG, ROBOT-ASSISTED, USING VATS (Left: Chest) LYMPH NODE BIOPSY (Left: Chest) BLOCK, NERVE, INTERCOSTAL (Left: Chest)  Patient Location: PACU  Anesthesia Type:General  Level of Consciousness: awake, alert , oriented, sedated, and patient cooperative  Airway & Oxygen Therapy: Patient Spontanous Breathing and Patient connected to face mask oxygen  Post-op Assessment: Report given to RN, Post -op Vital signs reviewed and stable, and Patient moving all extremities  Post vital signs: Reviewed and stable  Last Vitals:  Vitals Value Taken Time  BP 126/64 02/25/24 14:45  Temp    Pulse 89 02/25/24 14:45  Resp 19 02/25/24 14:45  SpO2 99 % 02/25/24 14:45  Vitals shown include unfiled device data.  Last Pain:  Vitals:   02/25/24 0851  TempSrc:   PainSc: 0-No pain      Patients Stated Pain Goal: 0 (02/25/24 0851)  Complications: There were no known notable events for this encounter.

## 2024-02-25 NOTE — Discharge Summary (Signed)
 "       [address] [place] [address]             [phone]        Physician Discharge Summary  Patient ID: Andrea Newton MRN: [id number] DOB/AGE: [birth year] 69 y.o.  Admit date: 02/25/2024 Discharge date: 02/26/2024  Admission Diagnoses: Patient Active Problem List   Diagnosis Date Noted   Left lower lobe pulmonary nodule 01/09/2024   Acute maxillary sinusitis 03/29/2015   Eczema of scalp 11/30/2014   Multinodular goiter 09/29/2014   Health maintenance examination 01/02/2014   Impaired fasting glucose 01/02/2014   Borderline hyperlipidemia 01/02/2014   Vitamin D  deficiency 01/02/2014   UTI (urinary tract infection) 05/21/2013   Discharge Diagnoses:  Patient Active Problem List   Diagnosis Date Noted   Adenocarcinoma of left lung (HCC) 02/26/2024   S/P Robotic Assisted Video Thoracoscopy with Left Lower Lobectomy 02/25/2024   Left lower lobe pulmonary nodule 01/09/2024   Acute maxillary sinusitis 03/29/2015   Eczema of scalp 11/30/2014   Multinodular goiter 09/29/2014   Health maintenance examination 01/02/2014   Impaired fasting glucose 01/02/2014   Borderline hyperlipidemia 01/02/2014   Vitamin D  deficiency 01/02/2014   UTI (urinary tract infection) 05/21/2013   Discharged Condition: good  History of Present Illness:  Andrea Newton is a 69 yo female with known history of HTN, GERD, recurrent UTI, and thyroid  nodules.  She recently underwent calcium  scoring which revealed an incidental finding of a pulmonary noudle in the left lower lobe.  Due to this she was referred to Pulmonary disease for evaluation.  She was evaluated by Dr. Shelah who recommended PET CT scan and bronchoscopy for nodule and lymph node biopsy.  PET showed the nodule to be hypermetabolic and highly suspicious for adenocarcinoma.  Bronchoscopy was performed on 12/8 and confirmed the diagnosis of Adenocarcinoma.  She was subsequently evaluated by Dr. Shyrl  who recommended surgical lobectomy.  The risks and benefits of the procedure were explained to the patient and she was agreeable to proceed.  Hospital Course:  Andrea Newton presented to [facility] on 02/25/2024.  She was taken to the operating room and underwent robotic assisted left lower lobectomy, lymph node dissection, and intercostal nerve block. She tolerated the procedure well and was transferred to the PACU in stable condition. The patient did very well post operatively.  Her chest tube was managed on water seal without evidence of air leak.  Her chest tube output was low and her chest tube was removed on POD#1.  Follow up chest xray showed stable appearance of minimal apical space.  She has been resumed on home medications.  She is tolerating a diet.  Pain is under good control.  She is stable for discharge home today.  Consults: None  Significant Diagnostic Studies:  CLINICAL DATA:  Initial treatment strategy for left lower lobe pulmonary mass.   EXAM: NUCLEAR MEDICINE PET SKULL BASE TO THIGH   TECHNIQUE: 9.04 mCi F-18 FDG was injected intravenously. Full-ring PET imaging was performed from the skull base to thigh after the radiotracer. CT data was obtained and used for attenuation correction and anatomic localization.   Fasting blood glucose: 96 mg/dl   COMPARISON:  Chest CT 01/15/2024   FINDINGS: Mediastinal blood pool activity: SUV max 2.7   Liver activity: SUV max NA   NECK: No hypermetabolic lymph nodes in the neck.   Incidental CT findings: Multinodular thyroid  goiter but no hypermetabolic thyroid   lesions are identified. This has been evaluated on previous imaging. (ref: J Am Coll Radiol. 2015 Feb;12(2): 143-50).   CHEST: The large part solid left lower lobe lung lesion demonstrates hypermetabolism with SUV max of 2.9. Findings highly suspicious for adenocarcinoma.   8.5 mm AP window node demonstrates low level hypermetabolism with SUV max of  3.5. 8 mm right paratracheal node has an SUV max of 2.7. 12 mm subcarinal node has an SUV max of 3.7. Findings are indeterminate. These could represent inflamed/reactive lymph nodes, particularly given the patient's underlying lung disease/pulmonary fibrosis.   No hypermetabolic breast masses, supraclavicular or axillary adenopathy.   Incidental CT findings: Stable changes of interstitial lung disease. No pleural effusions. Large hiatal hernia noted.   ABDOMEN/PELVIS: No abnormal hypermetabolic activity within the liver, pancreas, adrenal glands, or spleen. No hypermetabolic lymph nodes in the abdomen or pelvis.   Incidental CT findings: Multiple hepatic cysts are noted. Bilateral renal calculi. No worrisome hepatic or adrenal gland lesions. Age advanced vascular calcifications most notably involving the lower aorta and bilateral iliac arteries. Moderate sigmoid colon diverticulosis. Pessary ring noted in the vagina. Suspect uterine fibroids.   SKELETON: No findings suspicious for osseous metastatic disease.   Incidental CT findings: None.   IMPRESSION: 1. The large part solid left lower lobe lung lesion is hypermetabolic and highly suspicious for adenocarcinoma. 2. Mildly hypermetabolic mediastinal lymph nodes are indeterminate. These could be inflammatory/reactive given the patient's underlying lung disease/pulmonary fibrosis. 3. No findings for metastatic disease involving the abdomen/pelvis or bony structures. 4. Age advanced vascular calcifications. 5. Aortic atherosclerosis.   Aortic Atherosclerosis (ICD10-I70.0).     Electronically Signed   By: Andrea Newton M.D.   On: 01/23/2024 14:41  Treatments: Surgery  Robotic Assisted Left Thoracoscopy with Left Upper Lobectomy, Lymph Node Dissection, Intercostal Nerve Block  Performed by Dr. Shyrl    PATHOLOGY: Pending  Discharge Exam: Blood pressure (!) 140/60, pulse 79, temperature 98.4 F (36.9 C),  temperature source Oral, resp. rate 18, height 5' 5 (1.651 m), weight 83.9 kg, last menstrual period 02/13/2010, SpO2 96%.  General appearance: alert, cooperative, and no distress Heart: regular rate and rhythm Lungs: clear to auscultation bilaterally Abdomen: soft, non-tender; bowel sounds normal; no masses,  no organomegaly Extremities: extremities normal, atraumatic, no cyanosis or edema Wound: clean and dry    Discharge disposition: 01-Home or Self Care   Allergies as of 02/26/2024       Reactions   Penicillins Rash        Medication List     STOP taking these medications    erythromycin  ophthalmic ointment   nitrofurantoin  (macrocrystal-monohydrate) 100 MG capsule Commonly known as: Macrobid        TAKE these medications    acetaminophen  500 MG tablet Commonly known as: TYLENOL  Take 1-2 tablets (500-1,000 mg total) by mouth every 6 (six) hours as needed.   albuterol  108 (90 Base) MCG/ACT inhaler Commonly known as: VENTOLIN  HFA Inhale 2 puffs into the lungs every 6 (six) hours as needed for wheezing or shortness of breath.   CALCIUM  600 PO Take 600 mg by mouth daily.   Ciclopirox  1 % shampoo Apply qod   Elidel 1 % cream Generic drug: pimecrolimus Apply 1 Application topically daily as needed (irritation).   gabapentin  300 MG capsule Commonly known as: Neurontin  Take 1 capsule (300 mg total) by mouth at bedtime.   hydrochlorothiazide  25 MG tablet Commonly known as: HYDRODIURIL  Take 1 tablet (25 mg total) by mouth daily.  loratadine 10 MG tablet Commonly known as: CLARITIN Take 10 mg by mouth daily.   oxyCODONE  5 MG immediate release tablet Commonly known as: Oxy IR/ROXICODONE  Take 1 tablet (5 mg total) by mouth every 4 (four) hours as needed for moderate pain (pain score 4-6).   pantoprazole  40 MG tablet Commonly known as: PROTONIX  Take 1 tablet (40 mg total) by mouth daily.   phentermine 37.5 MG tablet Commonly known as: ADIPEX-P Take  18.75 mg by mouth daily.   PRESERVISION AREDS 2+MULTI VIT PO Take 1 capsule by mouth daily.   saccharomyces boulardii 250 MG capsule Commonly known as: FLORASTOR Take 250 mg by mouth daily.   zolpidem  5 MG tablet Commonly known as: AMBIEN  TAKE 1 TABLET(5 MG) BY MOUTH AT BEDTIME AS NEEDED        Follow-up Information     Andrea Andrea KIDD, MD Follow up on 03/06/2024.   Specialty: Cardiothoracic Surgery Why: Follow up appointment is at 1:50PM. Please get a chest xray at 12:50PM, prior to your appointment on the 2nd floor of our building. Contact information: [address], [name] [address] [phone]         Candise Aleene DEL, MD. Schedule an appointment as soon as possible for a visit.   Specialty: Family Medicine Why: For hospital follow up Contact information: [address] [name] [address] [phone]                 Signed: Rocky Shad, PA-C  02/26/2024, 1:08 PM    "

## 2024-02-25 NOTE — Interval H&P Note (Signed)
 History and Physical Interval Note:  02/25/2024 11:07 AM  Andrea Newton  has presented today for surgery, with the diagnosis of LLL NODULE.  The various methods of treatment have been discussed with the patient and family. After consideration of risks, benefits and other options for treatment, the patient has consented to  Procedures with comments: LOBECTOMY, LUNG, ROBOT-ASSISTED, USING VATS (Left) - LEFT ROBOTIC LEFT LOWER LOBECTOMY as a surgical intervention.  The patient's history has been reviewed, patient examined, no change in status, stable for surgery.  I have reviewed the patient's chart and labs.  Questions were answered to the patient's satisfaction.     Kathline Banbury MALVA Rayas

## 2024-02-25 NOTE — Anesthesia Procedure Notes (Signed)
 Procedure Name: Intubation Date/Time: 02/25/2024 12:10 PM  Performed by: Chaney Ozell CROME, CRNAPre-anesthesia Checklist: Patient identified, Emergency Drugs available, Suction available and Patient being monitored Patient Re-evaluated:Patient Re-evaluated prior to induction Oxygen Delivery Method: Circle System Utilized Preoxygenation: Pre-oxygenation with 100% oxygen Induction Type: IV induction Ventilation: Mask ventilation without difficulty and Oral airway inserted - appropriate to patient size Laryngoscope Size: Mac and 3 Grade View: Grade II Endobronchial tube: Double lumen EBT, EBT position confirmed by auscultation, EBT position confirmed by fiberoptic bronchoscope and Left and 37 Fr Number of attempts: 1 Airway Equipment and Method: Stylet and Oral airway Placement Confirmation: ETT inserted through vocal cords under direct vision, positive ETCO2 and breath sounds checked- equal and bilateral Secured at: 28 cm Tube secured with: Tape Dental Injury: Teeth and Oropharynx as per pre-operative assessment

## 2024-02-25 NOTE — Op Note (Signed)
" ° °   °  [address] [place] [address]             [phone]     02/25/2024  Patient:  ATHEENA SPANO Pre-Op Dx: Left lower lobe NSCLC   Post-op Dx:  same Procedure: - Robotic assisted left video thoracoscopy - left lower lobectomy - Mediastinal lymph node sampling - Intercostal nerve block  Surgeon and Role:      * Trinka Keshishyan, Linnie KIDD, MD - Primary  Assistant: FREDRIK Purpura, MD  An experienced assistant was required given the complexity of this surgery and the standard of surgical care. The assistant was needed for exposure, dissection, suctioning, retraction of delicate tissues and sutures, instrument exchange and for overall help during this procedure.    Anesthesia  general EBL:  50 ml Blood Administration: none Specimen:  left lower lobe, hilar and mediastinal nodes  Drains: 28 F argyle chest tube in left chest Counts: correct   Indications: 69 y.o. 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  She also had some avidity on PET/CT involving her hilar and mediastinal lymph nodes, but these were all biopsied and negative.  Based on these findings I think that she would be a good candidate for surgical resection.  We discussed the risks and benefits of a left robotic assisted thoracoscopy with left lower lobectomy.  She is agreeable to proceed.   Findings: Normal anatomy  Operative Technique: After the risks, benefits and alternatives were thoroughly discussed, the patient was brought to the operative theatre.  Anesthesia was induced, and the patient was then placed in a lateral decubitus position and was prepped and draped in normal sterile fashion.  An appropriate surgical pause was performed, and pre-operative antibiotics were dosed accordingly.  We began by placing our 4 robotic ports in the the 7th intercostal space targeting the hilum of the lung.  A 12mm assistant port was placed in the 9th intercostal space in the anterior axillary  line.  The robot was then docked and all instruments were passed under direct visualization.    The lung was then retracted superiorly, and the inferior pulmonary ligament was divided.  The hilum was mobilized anteriorly and posteriorly.  We identified the lower lobe pulmonary vein, and after careful isolation, it was divided with a vascular stapler.  We next moved to the pulmonary artery.  The artery was then divided with a vascular load stapler.  The bronchus to the lower lobe was then isolated.  After a test clamp, with good ventilation of the remaining lung, the bronchus was then divided.  The fissure was completed, and the specimen was passed into an endocatch bag.  It was removed from the anterior access site.    Lymph nodes were then sampled at hilum and mediastinum.  The chest was irrigated, and an air leak test was performed.  An intercostal nerve block was performed under direct visualization.  A 28 F chest tube was then placed, and we watch the remaining lobes re-expand.  The skin and soft tissue were closed with absorbable suture    The patient tolerated the procedure without any immediate complications, and was transferred to the PACU in stable condition.  Maelie Chriswell O Raeanne Deschler  "

## 2024-02-25 NOTE — Plan of Care (Signed)
" °  Problem: Skin Integrity: Goal: Demonstrates signs of wound healing without infection Outcome: Progressing   Problem: Nutrition: Goal: Adequate nutrition will be maintained Outcome: Progressing   Problem: Coping: Goal: Level of anxiety will decrease Outcome: Progressing   Problem: Elimination: Goal: Will not experience complications related to bowel motility Outcome: Progressing   Problem: Pain Managment: Goal: General experience of comfort will improve and/or be controlled Outcome: Progressing   Problem: Safety: Goal: Ability to remain free from injury will improve Outcome: Progressing   "

## 2024-02-26 ENCOUNTER — Inpatient Hospital Stay (HOSPITAL_COMMUNITY)

## 2024-02-26 ENCOUNTER — Encounter (HOSPITAL_COMMUNITY): Payer: Self-pay | Admitting: Thoracic Surgery (Cardiothoracic Vascular Surgery)

## 2024-02-26 ENCOUNTER — Other Ambulatory Visit (HOSPITAL_COMMUNITY): Payer: Self-pay

## 2024-02-26 ENCOUNTER — Other Ambulatory Visit: Payer: Self-pay

## 2024-02-26 DIAGNOSIS — C3492 Malignant neoplasm of unspecified part of left bronchus or lung: Secondary | ICD-10-CM

## 2024-02-26 LAB — BASIC METABOLIC PANEL WITH GFR
Anion gap: 9 (ref 5–15)
BUN: 17 mg/dL (ref 8–23)
CO2: 28 mmol/L (ref 22–32)
Calcium: 8.7 mg/dL — ABNORMAL LOW (ref 8.9–10.3)
Chloride: 101 mmol/L (ref 98–111)
Creatinine, Ser: 0.59 mg/dL (ref 0.44–1.00)
GFR, Estimated: >60 mL/min
Glucose, Bld: 149 mg/dL — ABNORMAL HIGH (ref 70–99)
Potassium: 4.2 mmol/L (ref 3.5–5.1)
Sodium: 138 mmol/L (ref 135–145)

## 2024-02-26 LAB — CBC
HCT: 35 % — ABNORMAL LOW (ref 36.0–46.0)
Hemoglobin: 11.5 g/dL — ABNORMAL LOW (ref 12.0–15.0)
MCH: 30.5 pg (ref 26.0–34.0)
MCHC: 32.9 g/dL (ref 30.0–36.0)
MCV: 92.8 fL (ref 80.0–100.0)
Platelets: 222 K/uL (ref 150–400)
RBC: 3.77 MIL/uL — ABNORMAL LOW (ref 3.87–5.11)
RDW: 13.3 % (ref 11.5–15.5)
WBC: 11.1 K/uL — ABNORMAL HIGH (ref 4.0–10.5)
nRBC: 0 % (ref 0.0–0.2)

## 2024-02-26 MED ORDER — GABAPENTIN 300 MG PO CAPS
300.0000 mg | ORAL_CAPSULE | Freq: Every day | ORAL | 0 refills | Status: AC
  Filled 2024-02-26: qty 30, 30d supply, fill #0

## 2024-02-26 MED ORDER — ACETAMINOPHEN 500 MG PO TABS
500.0000 mg | ORAL_TABLET | Freq: Four times a day (QID) | ORAL | 0 refills | Status: AC | PRN
  Filled 2024-02-26: qty 30, 4d supply, fill #0

## 2024-02-26 MED ORDER — OXYCODONE HCL 5 MG PO TABS
5.0000 mg | ORAL_TABLET | ORAL | 0 refills | Status: AC | PRN
  Filled 2024-02-26: qty 30, 5d supply, fill #0

## 2024-02-26 NOTE — Plan of Care (Signed)
" °  Problem: Education: Goal: Knowledge of the prescribed therapeutic regimen will improve Outcome: Progressing   Problem: Bowel/Gastric: Goal: Gastrointestinal status for postoperative course will improve Outcome: Progressing   Problem: Nutritional: Goal: Will attain and maintain optimal nutritional status Outcome: Progressing   Problem: Neurological: Goal: Will regain or maintain usual level of consciousness Outcome: Progressing   "

## 2024-02-26 NOTE — Progress Notes (Signed)
" ° °   °  [address] [place] [address]             [phone]         1 Day Post-Op Procedures (LRB): LOBECTOMY, LUNG, ROBOT-ASSISTED, USING VATS (Left) LYMPH NODE BIOPSY (Left) BLOCK, NERVE, INTERCOSTAL (Left)  Subjective:  Patient looks great.  Sitting up in bed.   Denies N/V, able to eat breakfast without difficulty.  Pain is well controlled  Objective: Vital signs in last 24 hours: Temp:  [97.6 F (36.4 C)-98.6 F (37 C)] 98.3 F (36.8 C) (01/13 0748) Pulse Rate:  [67-92] 86 (01/13 0748) Cardiac Rhythm: Normal sinus rhythm (01/12 1924) Resp:  [6-20] 20 (01/13 0748) BP: (111-150)/(56-109) 125/56 (01/13 0748) SpO2:  [92 %-100 %] 95 % (01/13 0748) Weight:  [83.9 kg] 83.9 kg (01/12 0850)  Intake/Output from previous day: 01/12 0701 - 01/13 0700 In: 1651.8 [P.O.:480; I.V.:1171.8] Out: 38 [Chest Tube:38]  General appearance: alert, cooperative, and no distress Heart: regular rate and rhythm Lungs: clear to auscultation bilaterally Abdomen: soft, non-tender; bowel sounds normal; no masses,  no organomegaly Extremities: extremities normal, atraumatic, no cyanosis or edema Wound: clean and dry  Lab Results: Recent Labs    02/26/24 0232  WBC 11.1*  HGB 11.5*  HCT 35.0*  PLT 222   BMET:  Recent Labs    02/26/24 0232  NA 138  K 4.2  CL 101  CO2 28  GLUCOSE 149*  BUN 17  CREATININE 0.59  CALCIUM 8.7*    PT/INR: No results for input(s): LABPROT, INR in the last 72 hours. ABG No results found for: PHART, HCO3, TCO2, ACIDBASEDEF, O2SAT CBG (last 3)  No results for input(s): GLUCAP in the last 72 hours.  Assessment/Plan: S/P Procedures (LRB): LOBECTOMY, LUNG, ROBOT-ASSISTED, USING VATS (Left) LYMPH NODE BIOPSY (Left) BLOCK, NERVE, INTERCOSTAL (Left)  CV- NSR, BP controlled- home hydrochlorothiazide  ordered Pulm- CT on water seal, no air leak present. About 100 cc output since surgery. CXR with trace apical space  will d/c chest tube today Renal-creatinine is stable okay to continue Toradol  prn Lovenox  for DVT prophylaxis  Dispo- d/c chest tube today, repeat CXR at 12.. if stable for discharge this afternoon vs. Tomorrow AM   LOS: 1 day    Rocky Shad, PA-C 02/26/2024 8:14 AM    "

## 2024-02-26 NOTE — Progress Notes (Signed)
" ° °   °  [address] [place] [address]             [phone]        Patient's chest tube removed without difficulty.  Post pull chest xray was stable.  Will discharge home today.   Margaretmary Prisk, PA-C 1:02 PM 02/26/2024  "

## 2024-02-26 NOTE — Discharge Instructions (Signed)
 Discharge Instructions:  1. You may shower, please wash incisions daily with soap and water and keep dry.  If you wish to cover wounds with dressing you may do so but please keep clean and change daily.  No tub baths or swimming until incisions have completely healed.  If your incisions become red or develop any drainage please call our office at [phone]  2. No Driving until cleared by Dr. Lang office and you are no longer using narcotic pain medications  3. Fever of 101.5 for at least 24 hours with no source, please contact our office at [phone]  4. Activity- up as tolerated, please walk at least 3 times per day.  Avoid strenuous activity until cleared  5. If any questions or concerns arise, please do not hesitate to contact our office at [phone]

## 2024-02-27 ENCOUNTER — Telehealth: Payer: Self-pay

## 2024-02-27 ENCOUNTER — Ambulatory Visit: Payer: Self-pay | Admitting: Family Medicine

## 2024-02-27 ENCOUNTER — Encounter: Payer: Self-pay | Admitting: Family Medicine

## 2024-02-27 NOTE — Transitions of Care (Post Inpatient/ED Visit) (Signed)
" ° °  02/27/2024  Name: GINNA SCHUUR MRN: [id number] DOB: [birth year]  Today's TOC FU Call Status: Today's TOC FU Call Status:: Unsuccessful Call (1st Attempt) Unsuccessful Call (1st Attempt) Date: 02/27/24  Attempted to reach the patient regarding the most recent Inpatient/ED visit.  Follow Up Plan: Additional outreach attempts will be made to reach the patient to complete the Transitions of Care (Post Inpatient/ED visit) call.   Shona Prow RN, CCM [place]  VBCI-Population Health RN Care Manager [phone]  "

## 2024-02-28 ENCOUNTER — Telehealth: Payer: Self-pay

## 2024-02-28 LAB — SURGICAL PATHOLOGY

## 2024-02-28 NOTE — Transitions of Care (Post Inpatient/ED Visit) (Signed)
 "  02/28/2024  Name: Andrea Newton MRN: [id number] DOB: June 05, 1955  Today's TOC FU Call Status: Today's TOC FU Call Status:: Successful TOC FU Call Completed TOC FU Call Complete Date: 02/28/24  Patient's Name and Date of Birth confirmed. DOB, Name  Transition Care Management Follow-up Telephone Call How have you been since you were released from the hospital?: Better (as long as I stay up on the pain meds) Any questions or concerns?: No  Items Reviewed: Did you receive and understand the discharge instructions provided?: Yes Medications obtained,verified, and reconciled?: Yes (Medications Reviewed) Any new allergies since your discharge?: No Dietary orders reviewed?: NA Do you have support at home?: Yes People in Home [RPT]: spouse Name of Support/Comfort Primary Source: husband, Wolm helps as needed  Medications Reviewed Today: Medications Reviewed Today     Reviewed by Lauro Shona LABOR, RN (Registered Nurse) on 02/28/24 at 1348  Med List Status: <None>   Medication Order Taking? Sig Documenting Provider Last Dose Status Informant  acetaminophen  (TYLENOL ) 500 MG tablet [id number] Yes Take 1-2 tablets (500-1,000 mg total) by mouth every 6 (six) hours as needed. Barrett, Rocky SAUNDERS, PA-C  Active   albuterol  (VENTOLIN  HFA) 108 (90 Base) MCG/ACT inhaler [id number] Yes Inhale 2 puffs into the lungs every 6 (six) hours as needed for wheezing or shortness of breath. McGowen, Philip H, MD  Active Self  Calcium Carbonate (CALCIUM 600 PO) [id number] Yes Take 600 mg by mouth daily. [provider]  Active Self  Ciclopirox  1 % shampoo [id number] Yes Apply qod McGowen, Philip H, MD  Active Self  ELIDEL 1 % cream [id number]  Apply 1 Application topically daily as needed (irritation).  Patient not taking: Reported on 02/28/2024   [provider]  Active Self  gabapentin  (NEURONTIN ) 300 MG capsule [id number] Yes Take 1 capsule (300 mg total) by mouth at bedtime. Barrett, Rocky SAUNDERS, PA-C  Active   hydrochlorothiazide  (HYDRODIURIL ) 25 MG tablet [id number] Yes Take 1 tablet (25 mg total) by mouth daily. McGowen, Philip H, MD  Active Self  loratadine (CLARITIN) 10 MG tablet [id number] Yes Take 10 mg by mouth daily. [provider]  Active Self  Multiple Vitamins-Minerals (PRESERVISION AREDS 2+MULTI VIT PO) [id number] Yes Take 1 capsule by mouth daily. [provider]  Active Self  oxyCODONE  (OXY IR/ROXICODONE ) 5 MG immediate release tablet [id number] Yes Take 1 tablet (5 mg total) by mouth every 4 (four) hours as needed for moderate pain (pain score 4-6). Barrett, Rocky SAUNDERS, PA-C  Active   pantoprazole  (PROTONIX ) 40 MG tablet [id number] Yes Take 1 tablet (40 mg total) by mouth daily. McGowen, Philip H, MD  Active Self  phentermine (ADIPEX-P) 37.5 MG tablet [id number]  Take 18.75 mg by mouth daily.  Patient not taking: Reported on 02/28/2024   [provider]  Active Self  saccharomyces boulardii (FLORASTOR) 250 MG capsule [id number] Yes Take 250 mg by mouth daily. [provider]  Active Self  zolpidem  (AMBIEN ) 5 MG tablet [id number]  TAKE 1 TABLET(5 MG) BY MOUTH AT BEDTIME AS NEEDED  Patient not taking: Reported on 02/28/2024   McGowen, Philip H, MD  Active Self            Home Care and Equipment/Supplies: Were Home Health Services Ordered?: No Any new equipment or medical supplies ordered?: No  Functional Questionnaire: Do you need assistance with bathing/showering or dressing?: No Do you need assistance with meal preparation?: Yes (friends are providing meals at this time - she can microwave)  Do you need assistance with eating?: No Do you have difficulty maintaining continence: No Do you need assistance with getting out of bed/getting out of a chair/moving?: No Do you have difficulty managing or taking your medications?: No  Follow up appointments reviewed: PCP Follow-up appointment confirmed?: Yes Date of PCP follow-up appointment?:  03/05/24 Follow-up Provider: [facility] Follow-up appointment confirmed?: Yes Date of Specialist follow-up appointment?: 03/06/24 Follow-Up Specialty Provider:: surgeon, Linnie Rayas Do you need transportation to your follow-up appointment?: No Do you understand care options if your condition(s) worsen?: Yes-patient verbalized understanding  SDOH Interventions Today    Flowsheet Row Most Recent Value  SDOH Interventions   Food Insecurity Interventions Intervention Not Indicated  Housing Interventions Intervention Not Indicated  Transportation Interventions Intervention Not Indicated  Utilities Interventions Intervention Not Indicated    Goals Addressed             This Visit's Progress    VBCI Transitions of Care (TOC) Care Plan       Problems:  Recent Hospitalization for treatment of Adenocarcinoma of left lung -  S/P Robotic Assisted Video Thoracoscopy with Left Lower Lobectomy   Goal:  Over the next 30 days, the patient will not experience hospital readmission  Interventions:  Transitions of Care: Doctor Visits  - discussed the importance of doctor visits Arranged PCP follow-up within 7 days -TOC scheduled though bookit - 03/05/24 with PCP  Oncology: Assessment of understanding of oncology diagnosis:  Assessed patient understanding of cancer diagnosis and recommended treatment plan Reviewed upcoming provider appointments and treatment appointments Assessed available transportation to appointments and treatments. Has consistent/reliable transportation: Yes Assessed support system. Has consistent/reliable family or other support: Yes  Surgery (Robotic Assisted Video Thoracoscopy with Left Lower Lobectomy) Evaluation of current treatment plan related to left lower lobectomy reviewed post-operative instructions with patient/caregiver reviewed medications with patient and addressed questions reviewed scheduled provider appointments with patient  appt scheduled with surgeon, Dr Rayas 03/06/24 confirmed availability of transportation to all appointments patient denied any issues with transportation to appts  Patient Self Care Activities:  Attend all scheduled provider appointments Call pharmacy for medication refills 3-7 days in advance of running out of medications Call provider office for new concerns or questions  Notify RN Care Manager of TOC call rescheduling needs Participate in Transition of Care Program/Attend TOC scheduled calls Take medications as prescribed   Monitor incisions and report any s/s infection  Notify provider of worsening pain Notify provider of worsening constipation  Plan:  Telephone follow up appointment with care management team member scheduled for:  03/07/24 in the morning The patient has been provided with contact information for the care management team and has been advised to call with any health related questions or concerns.         Shona Prow RN, CCM [place]  VBCI-Population Health RN Care Manager [phone]  "

## 2024-02-28 NOTE — Patient Instructions (Signed)
 Visit Information  Thank you for taking time to visit with me today. Please don't hesitate to contact me if I can be of assistance to you before our next scheduled telephone appointment.  Our next appointment is by telephone on 03/07/24 in the morning   Following is a copy of your care plan:   Goals Addressed             This Visit's Progress    VBCI Transitions of Care (TOC) Care Plan       Problems:  Recent Hospitalization for treatment of Adenocarcinoma of left lung -  S/P Robotic Assisted Video Thoracoscopy with Left Lower Lobectomy   Goal:  Over the next 30 days, the patient will not experience hospital readmission  Interventions:  Transitions of Care: Doctor Visits  - discussed the importance of doctor visits Arranged PCP follow-up within 7 days -TOC scheduled though bookit - 03/05/24 with PCP  Oncology: Assessment of understanding of oncology diagnosis:  Assessed patient understanding of cancer diagnosis and recommended treatment plan Reviewed upcoming provider appointments and treatment appointments Assessed available transportation to appointments and treatments. Has consistent/reliable transportation: Yes Assessed support system. Has consistent/reliable family or other support: Yes  Surgery (Robotic Assisted Video Thoracoscopy with Left Lower Lobectomy) Evaluation of current treatment plan related to left lower lobectomy reviewed post-operative instructions with patient/caregiver reviewed medications with patient and addressed questions reviewed scheduled provider appointments with patient appt scheduled with surgeon, Dr Shyrl 03/06/24 confirmed availability of transportation to all appointments patient denied any issues with transportation to appts  Patient Self Care Activities:  Attend all scheduled provider appointments Call pharmacy for medication refills 3-7 days in advance of running out of medications Call provider office for new concerns or questions   Notify RN Care Manager of TOC call rescheduling needs Participate in Transition of Care Program/Attend TOC scheduled calls Take medications as prescribed   Monitor incisions and report any s/s infection  Notify provider of worsening pain Notify provider of worsening constipation  Plan:  Telephone follow up appointment with care management team member scheduled for:  03/07/24 in the morning The patient has been provided with contact information for the care management team and has been advised to call with any health related questions or concerns.         Patient verbalizes understanding of instructions and care plan provided today and agrees to view in MyChart. Active MyChart status and patient understanding of how to access instructions and care plan via MyChart confirmed with patient.     Telephone follow up appointment with care management team member scheduled for: 03/07/24 The patient has been provided with contact information for the care management team and has been advised to call with any health related questions or concerns.   Please call the care guide team at [phone] if you need to cancel or reschedule your appointment.   Please call the Suicide and Crisis Lifeline: 988 call 1-800-273-TALK (toll free, 24 hour hotline) call 911 if you are experiencing a Mental Health or Behavioral Health Crisis or need someone to talk to.  Shona Prow RN, CCM [place]  VBCI-Population Health RN Care Manager [phone]

## 2024-02-28 NOTE — Telephone Encounter (Signed)
 Noted: nurse phone contact with patient for TCM. Signed:  Gerlene Hockey, MD           02/28/2024

## 2024-03-02 ENCOUNTER — Encounter: Payer: Self-pay | Admitting: Family Medicine

## 2024-03-04 ENCOUNTER — Telehealth: Payer: Self-pay

## 2024-03-04 ENCOUNTER — Other Ambulatory Visit: Payer: Self-pay | Admitting: Thoracic Surgery (Cardiothoracic Vascular Surgery)

## 2024-03-04 DIAGNOSIS — R911 Solitary pulmonary nodule: Secondary | ICD-10-CM

## 2024-03-05 ENCOUNTER — Encounter: Payer: Self-pay | Admitting: Family Medicine

## 2024-03-05 ENCOUNTER — Ambulatory Visit: Admitting: Family Medicine

## 2024-03-05 VITALS — BP 124/76 | HR 97 | Temp 98.1°F | Ht 65.0 in | Wt 187.8 lb

## 2024-03-05 DIAGNOSIS — Z85118 Personal history of other malignant neoplasm of bronchus and lung: Secondary | ICD-10-CM

## 2024-03-05 DIAGNOSIS — E7800 Pure hypercholesterolemia, unspecified: Secondary | ICD-10-CM

## 2024-03-05 MED ORDER — ATORVASTATIN CALCIUM 20 MG PO TABS: 20.0000 mg | ORAL_TABLET | Freq: Every day | ORAL | 1 refills | Status: AC

## 2024-03-05 NOTE — Progress Notes (Signed)
 " 03/05/2024  CC:  Chief Complaint  Patient presents with   Hospitalization Follow-up    Patient is a 69 y.o. Caucasian female who presents for  hospital follow up, specifically Transitional Care Services face-to-face visit. Dates hospitalized: 1/12 to 02/26/2024. Days since d/c from hospital: 8 days Patient was discharged from hospital to home. Reason for admission to hospital: Lung cancer resection. Date of interactive (phone) contact with patient and/or caregiver: 02/27/2024.  I have reviewed patient's discharge summary plus pertinent specific notes, labs, and imaging from the hospitalization.  Left lower lobectomy on 02/25/2024 for primary adenocarcinoma of the lung. Path showed clear margins and negative nodes.  No postsurgical complications.  Currently: She is feeling well.  Minimal pain at the incision site.  No fever, cough, shortness of breath, chest pain, or lower extremity swelling or pain.  She has follow-up appointment set with CT surgery tomorrow afternoon.  Her tumor was initially discovered incidentally when she got coronary calcium  scoring. Her score was 15, which is 60th percentile.  Discharge medication list: Tylenol  1000 mg every 6 hours as needed, albuterol  2 puffs every 6 hours as needed, calcium  600 mg daily, gabapentin  300 mg nightly, HCTZ 25 mg daily, loratadine 10 mg daily, oxycodone  5 mg every 4 hours as needed, pantoprazole  40 mg daily, phentermine 18.75 mg daily, Florastor 250 mg capsule daily, and Ambien  5 mg nightly as needed. Medication reconciliation was done today and patient is taking meds as recommended by discharging hospitalist/specialist.    PMH:  Past Medical History:  Diagnosis Date   Bleeding internal hemorrhoids 2016   Borderline hypercholesterolemia 2019   10 yr Fr[facility]ham cv risk = 4.5%. 10/2020 fmhm cv risk=6%.  October 2025 risk 8%.   Constipation    Essential hypertension    GERD (gastroesophageal reflux disease)    History of  adenomatous polyp of colon 04/2014   x 1: recall 5 yrs ([facility])   Infectious mononucleosis hepatitis college   Localized osteoporosis of spine    Mild intermittent asthma    Multiple thyroid  nodules 2010 approx; 2015   Euthyroid.  Saw ENDO (Dr. Aura).  Repeat thyroid  u/s 12/2013 showed thyromegaly with multiple nodules meeting biopsy criteria--sent pt to Dr. Trixie and plan for repeat u/s (09/2014) and one nodule had grown so bx -->benign.  Labs 03/2014 showed elevted TPO ab, supportive of Hashimoto's thyroiditis--a reassuring finding.  Pt euthyroid. 11/2018 u/s->stable. Annual TFT's with me, 2 yr f/u Dr. Paschal   Osteoarthritis    knees>hips.  Takes no meds.   Osteopenia 2017; 2019; 03/2020   Stable DEXA results 2017-2019. T score -2.18 Mar 2020. Rpt 2024.   Postmenopausal status 04/2010   FSH and LH elevated, estrogen low.   Primary lung adenocarcinoma (HCC)    left lower lobe--->lobectomy 02/2024 (neg margins, neg LN's)   Recurrent UTI     PSH:  Past Surgical History:  Procedure Laterality Date   BIOPSY THYROID   11/05/2014   Scant follicular epithelium: benign   BRONCHIAL NEEDLE ASPIRATION BIOPSY  01/21/2024   + adenocarcinoma. Procedure: BRONCHOSCOPY, WITH NEEDLE ASPIRATION BIOPSY;  Surgeon: Shelah Lamar RAMAN, MD;  Location: [facility] ENDOSCOPY;  Service: Pulmonary;;   BRONCHIAL WASHINGS  01/21/2024   Procedure: IRRIGATION, BRONCHUS;  Surgeon: Shelah Lamar RAMAN, MD;  Location: [facility] ENDOSCOPY;  Service: Pulmonary;;   Cervical cancer screening  03/11/2013   No hx of abnormals.  Neg pap and Neg HR HPV testing: next pap should be after 03/11/2018 and should include HR HPV testing  as well.   COLONOSCOPY W/ POLYPECTOMY  05/2011; 04/2014   IH and 'tics (Dr[facility] Shana [facility]).  Polyp 04/2014= tubular adenoma (recall 5 yrs)     Coronary calcium  score     October 2025, 60th percentile   CRYOTHERAPY  01/21/2024   Procedure: CRYOTHERAPY;  Surgeon: Shelah Lamar RAMAN, MD;  Location:  [facility] ENDOSCOPY;  Service: Pulmonary;;   DEXA  05/2015; 07/2017; 03/18/2020   T score -2.1: osteopenia, fracture risk not high enough to recommend anything other than calcium  and vit D.  June 2019: T-score -2.1. 03/2020 T score -2.3. Plan rpt 2024.   ENDOMETRIAL BIOPSY  2006   Performed b/c of abnormal uterine bleeding: normal (fragmented INACTIVE endometrium).  [facility] [place], [name].   EYE SURGERY  lasik 2002   HEMORRHOID BANDING  April and May 2016   [facility]   INTERCOSTAL NERVE BLOCK Left 02/25/2024   Procedure: BLOCK, NERVE, INTERCOSTAL;  Surgeon: Shyrl Linnie KIDD, MD;  Location: [facility] OR;  Service: Thoracic;  Laterality: Left;   JOINT REPLACEMENT  01/29/2023   right knee   LASIK  2002   LOBECTOMY, LUNG, ROBOT-ASSISTED, USING VATS Left 02/25/2024   Mucinous adenocarcinoma, neg margins, LN's neg. Procedure: LOBECTOMY, LUNG, ROBOT-ASSISTED, USING VATS;  Surgeon: Shyrl Linnie KIDD, MD;  Location: [facility] OR;  Service: Thoracic;  Laterality: Left;  LEFT ROBOTIC LEFT LOWER LOBECTOMY   LYMPH NODE BIOPSY Left 02/25/2024   NO CANCER. Procedure: LYMPH NODE BIOPSY;  Surgeon: Shyrl Linnie KIDD, MD;  Location: [facility] OR;  Service: Thoracic;  Laterality: Left;   VIDEO BRONCHOSCOPY WITH ENDOBRONCHIAL NAVIGATION Left 01/21/2024   Procedure: VIDEO BRONCHOSCOPY WITH ENDOBRONCHIAL NAVIGATION;  Surgeon: Shelah Lamar RAMAN, MD;  Location: [facility] ENDOSCOPY;  Service: Pulmonary;  Laterality: Left;  EBUS scope available also   VIDEO BRONCHOSCOPY WITH ENDOBRONCHIAL ULTRASOUND N/A 01/21/2024   Procedure: BRONCHOSCOPY, WITH EBUS;  Surgeon: Shelah Lamar RAMAN, MD;  Location: [facility] ENDOSCOPY;  Service: Pulmonary;  Laterality: N/A;    MEDS:  Outpatient Medications Prior to Visit  Medication Sig Dispense Refill   acetaminophen  (TYLENOL ) 500 MG tablet Take 1-2 tablets (500-1,000 mg total) by mouth every 6 (six) hours as needed. 30 tablet 0   albuterol  (VENTOLIN  HFA) 108 (90 Base) MCG/ACT inhaler Inhale 2 puffs into  the lungs every 6 (six) hours as needed for wheezing or shortness of breath. 8 g 0   Calcium  Carbonate (CALCIUM  600 PO) Take 600 mg by mouth daily.     Ciclopirox  1 % shampoo Apply qod 120 mL 3   Cyanocobalamin  (B-12) 1000 MCG TABS Take 1,000 mcg by mouth daily.     gabapentin  (NEURONTIN ) 300 MG capsule Take 1 capsule (300 mg total) by mouth at bedtime. 30 capsule 0   hydrochlorothiazide  (HYDRODIURIL ) 25 MG tablet Take 1 tablet (25 mg total) by mouth daily. 90 tablet 3   loratadine (CLARITIN) 10 MG tablet Take 10 mg by mouth daily.     Multiple Vitamins-Minerals (PRESERVISION AREDS 2+MULTI VIT PO) Take 1 capsule by mouth daily.     oxyCODONE  (OXY IR/ROXICODONE ) 5 MG immediate release tablet Take 1 tablet (5 mg total) by mouth every 4 (four) hours as needed for moderate pain (pain score 4-6). 30 tablet 0   pantoprazole  (PROTONIX ) 40 MG tablet Take 1 tablet (40 mg total) by mouth daily. 90 tablet 3   saccharomyces boulardii (FLORASTOR) 250 MG capsule Take 250 mg by mouth daily.     ELIDEL 1 % cream Apply 1 Application topically  daily as needed (irritation). (Patient not taking: Reported on 03/05/2024)     phentermine (ADIPEX-P) 37.5 MG tablet Take 18.75 mg by mouth daily. (Patient not taking: Reported on 03/05/2024)     zolpidem  (AMBIEN ) 5 MG tablet TAKE 1 TABLET(5 MG) BY MOUTH AT BEDTIME AS NEEDED (Patient not taking: Reported on 03/05/2024) 90 tablet 1   No facility-administered medications prior to visit.    Physical Exam     03/05/2024   11:21 AM 02/26/2024   10:50 AM 02/26/2024    7:48 AM  Vitals with BMI  Height 5' 5    Weight 187 lbs 13 oz    BMI 31.25    Systolic 124 140 874  Diastolic 76 60 56  Pulse 97 79 86   Gen: Alert, well appearing.  Patient is oriented to person, place, time, and situation. AFFECT: pleasant, lucid thought and speech. CV: RRR, no m/r/g.   LUNGS: CTA bilat, nonlabored resps, good aeration in all lung fields. EXT: no edema or tenderness  Pertinent  labs/imaging Last CBC Lab Results  Component Value Date   WBC 11.1 (H) 02/26/2024   HGB 11.5 (L) 02/26/2024   HCT 35.0 (L) 02/26/2024   MCV 92.8 02/26/2024   MCH 30.5 02/26/2024   RDW 13.3 02/26/2024   PLT 222 02/26/2024   Lab Results  Component Value Date   IRON 64 09/06/2023   TIBC 341.6 09/06/2023   FERRITIN 33.0 09/06/2023   Lab Results  Component Value Date   VITAMINB12 249 09/06/2023   Last metabolic panel Lab Results  Component Value Date   GLUCOSE 149 (H) 02/26/2024   NA 138 02/26/2024   K 4.2 02/26/2024   CL 101 02/26/2024   CO2 28 02/26/2024   BUN 17 02/26/2024   CREATININE 0.59 02/26/2024   GFRNONAA >60 02/26/2024   CALCIUM  8.7 (L) 02/26/2024   PHOS 3.4 10/24/2012   PROT 7.4 02/21/2024   ALBUMIN 4.4 02/21/2024   BILITOT 0.3 02/21/2024   ALKPHOS 97 02/21/2024   AST 26 02/21/2024   ALT 27 02/21/2024   ANIONGAP 9 02/26/2024   Lab Results  Component Value Date   CHOL 224 (H) 11/29/2023   HDL 54.60 11/29/2023   LDLCALC 150 (H) 11/29/2023   TRIG 97.0 11/29/2023   CHOLHDL 4 11/29/2023   ASSESSMENT/PLAN:  #1 primary lung adenocarcinoma.  She is doing well postop left lower lobectomy. Fortunately her margins were free of tumor and her lymph nodes were negative. She has follow-up with her surgeon tomorrow. No lab work is needed today.  #2 hypercholesterolemia.  She is intermediate risk coronary artery disease. Her coronary calcium  score was in the 60th percentile. We discussed this today and decided to start atorvastatin  20 mg a day.  Will recheck her lipid panel when I see her back in February. Therapeutic expectations (goal LDL less than 100) and side effect profile of medication discussed today.  Patient's questions answered.  Medical decision making of moderate complexity was utilized today.  FOLLOW UP: Keep appointment already set for 05/29/2024  Signed:  Gerlene Hockey, MD           03/05/2024  "

## 2024-03-06 ENCOUNTER — Other Ambulatory Visit: Payer: Self-pay

## 2024-03-06 ENCOUNTER — Ambulatory Visit (INDEPENDENT_AMBULATORY_CARE_PROVIDER_SITE_OTHER): Payer: Self-pay | Admitting: Thoracic Surgery (Cardiothoracic Vascular Surgery)

## 2024-03-06 ENCOUNTER — Telehealth: Payer: Self-pay

## 2024-03-06 ENCOUNTER — Ambulatory Visit
Admission: RE | Admit: 2024-03-06 | Discharge: 2024-03-06 | Disposition: A | Discharge status: Home / Self Care | Payer: Self-pay | Source: Ambulatory Visit | Attending: Cardiology | Admitting: Cardiology

## 2024-03-06 VITALS — BP 157/83 | HR 79 | Resp 20 | Ht 65.0 in | Wt 188.0 lb

## 2024-03-06 DIAGNOSIS — Z902 Acquired absence of lung [part of]: Secondary | ICD-10-CM | POA: Insufficient documentation

## 2024-03-06 DIAGNOSIS — R911 Solitary pulmonary nodule: Secondary | ICD-10-CM

## 2024-03-06 NOTE — Progress Notes (Signed)
" ° °   °  [address] [place] [address]             [phone]      CHALONDA SCHLATTER [facility] Medical Record #[id number] Date of Birth: 04/11/1955  Referring: Shelah Lamar RAMAN, MD Primary Care: Candise Aleene DEL, MD Primary Cardiologist:None  Reason for visit:   follow-up  History of Present Illness:     MARILY KONCZAL presents for their first follow-up appointment.  Overall, She is doing well.    Physical Exam: BP (!) 157/83   Pulse 79   Resp 20   Ht 5' 5 (1.651 m)   Wt 188 lb (85.3 kg)   LMP 02/13/2010   SpO2 95% Comment: RA  BMI 31.28 kg/m   Alert NAD Incision clean.   Abdomen, ND no peripheral edema   Diagnostic Studies & Laboratory data:  Path:  FINAL MICROSCOPIC DIAGNOSIS:  A. LYMPH NODE, LEVEL 9, EXCISION: - Lymph node, negative for carcinoma (0/1)  B. LYMPH NODE, HILAR, EXCISION: - Lymph node, negative for carcinoma (0/1)  C. LYMPH NODE, HILAR #2, EXCISION: - Lymph node, negative for carcinoma (0/1)  D. LYMPH NODE, LEVEL 5, EXCISION: - Lymph node, negative for carcinoma (0/1)  E. LUNG, LEFT LOWER LOBE, LOBECTOMY: - Mucinous adenocarcinoma of lung, well-differentiated, 3.7 cm - Visceral pleural invasion is not identified - Resection margins are negative for carcinoma - No evidence of lymphovascular invasion - Two benign hilar lymph nodes, negative for carcinoma (0/2) - See oncology table      ONCOLOGY TABLE:  LUNG: Resection  Synchronous Tumors: Not applicable Total Number of Primary Tumors: 1 Procedure: Lobectomy, lung Specimen Laterality: Left Tumor Focality: Unifocal Tumor Site: Lower lobe Tumor Size: 3.7 cm Histologic Type: Adenocarcinoma, mucinous Visceral Pleura Invasion: Not identified Direct Invasion of Adjacent Structures: No adjacent structures present Lymphovascular Invasion: Not identified Margins: All margins negative for invasive carcinoma      Closest Margin(s) to Invasive  Carcinoma: Bronchovascular margin, 3.7 cm      Margin(s) Involved by Invasive Carcinoma: Not applicable       Margin Status for Non-Invasive Tumor: Not applicable Treatment Effect: No known presurgical therapy Regional Lymph Nodes:      Number of Lymph Nodes Involved: 0                           Nodal Sites with Tumor: Not applicable      Number of Lymph Nodes Examined: 6                      Nodal Sites Examined: Levels 5, 9 and 10 Distant Metastasis:      Distant Site(s) Involved: Not applicable Pathologic Stage Classification (pTNM, AJCC 8th Edition): pT2, pN0     Assessment / Plan:   69 y.o. female s/p T2N0M0 adenocarcinoma of the left lower lobe.  I have made a referral to medical oncology, along with a 1 month follow-up with a CXR.     Linnie MALVA Rayas 03/06/2024 4:21 PM  "

## 2024-03-06 NOTE — Progress Notes (Signed)
 Complex Care Management Note  Care Guide Note 03/06/2024 Name: Andrea Newton MRN: [id number] DOB: 04-28-1955  Andrea Newton is a 69 y.o. year old female who sees McGowen, Aleene DEL, MD for primary care. I reached out to [place] FORBES Bruckner by phone today to offer complex care management services.  [name] was given information about Complex Care Management services today including:   The Complex Care Management services include support from the care team which includes your Nurse Care Manager, Clinical Social Worker, or Pharmacist.  The Complex Care Management team is here to help remove barriers to the health concerns and goals most important to you. Complex Care Management services are voluntary, and the patient may decline or stop services at any time by request to their care team member.   Complex Care Management Consent Status: Patient did not agree to participate in complex care management services at this time.  Follow up plan:    Encounter Outcome:  Patient Refused  Jeoffrey Buffalo , RMA     [facility]  [facility] Guide  Direct Dial: [phone]  Website: [email or web address]

## 2024-03-07 ENCOUNTER — Telehealth: Payer: Self-pay

## 2024-03-07 NOTE — Progress Notes (Signed)
 The proposed treatment discussed in conference is for discussion purpose only and is not a binding recommendation.  The patients have not been physically examined, or presented with their treatment options.  Therefore, final treatment plans cannot be decided.

## 2024-03-07 NOTE — Addendum Note (Signed)
 Addended by: LAURO GIVENS A on: 03/07/2024 11:40 AM   Modules accepted: Orders, Level of Service

## 2024-03-07 NOTE — Telephone Encounter (Signed)
 This encounter was created in error - please disregard.

## 2024-03-10 ENCOUNTER — Telehealth: Payer: Self-pay

## 2024-03-10 NOTE — Transitions of Care (Post Inpatient/ED Visit) (Signed)
 " Transition of Care week 2  Visit Note  03/10/2024  Name: Andrea Newton MRN: [id number]          DOB: January 07, 1956  Situation: Patient enrolled in [facility] 30-day program. Visit completed with patient by telephone.   Background: Admit/Discharge Date:   1/12 - 1/13  Jolynn Pack   Primary Diagnosis:  Adenocarcinoma of left lung -  S/P Robotic Assisted Video Thoracoscopy with Left Lower Lobectomy   Initial Transition Care Management Follow-up Telephone Call Discharge Date and Diagnosis: 02/26/24, Adenocarcinoma of left lung -  S/P Robotic Assisted Video Thoracoscopy with Left Lower Lobectomy   Past Medical History:  Diagnosis Date   Bleeding internal hemorrhoids 2016   Borderline hypercholesterolemia 2019   10 yr Fr[facility]ham cv risk = 4.5%. 10/2020 fmhm cv risk=6%.  October 2025 risk 8%.   Constipation    Essential hypertension    GERD (gastroesophageal reflux disease)    History of adenomatous polyp of colon 04/2014   x 1: recall 5 yrs ([facility])   Infectious mononucleosis hepatitis [employer]   Localized osteoporosis of spine    Mild intermittent asthma    Multiple thyroid  nodules 2010 approx; 2015   Euthyroid.  Saw ENDO (Dr. Aura).  Repeat thyroid  u/s 12/2013 showed thyromegaly with multiple nodules meeting biopsy criteria--sent pt to Dr. Trixie and plan for repeat u/s (09/2014) and one nodule had grown so bx -->benign.  Labs 03/2014 showed elevted TPO ab, supportive of Hashimoto's thyroiditis--a reassuring finding.  Pt euthyroid. 11/2018 u/s->stable. Annual TFT's with me, 2 yr f/u Dr. Paschal   Osteoarthritis    knees>hips.  Takes no meds.   Osteopenia 2017; 2019; 03/2020   Stable DEXA results 2017-2019. T score -2.18 Mar 2020. Rpt 2024.   Postmenopausal status 04/2010   FSH and LH elevated, estrogen low.   Primary lung adenocarcinoma (HCC)    left lower lobe--->lobectomy 02/2024 (neg margins, neg LN's)   Recurrent UTI     Assessment: Patient Reported Symptoms: Cognitive  Cognitive Status: No symptoms reported, Normal speech and language skills, Alert and oriented to person, place, and time      Neurological Neurological Review of Symptoms: No symptoms reported    HEENT HEENT Symptoms Reported: No symptoms reported      Cardiovascular Cardiovascular Symptoms Reported: No symptoms reported    Respiratory Respiratory Symptoms Reported: Shortness of breath Other Respiratory Symptoms: Patient states she does still have a little shortness of breath every now and then Respiratory Management Strategies: Adequate rest, Activity, Medication therapy Respiratory Self-Management Outcome: 4 (good)  Endocrine Endocrine Symptoms Reported: No symptoms reported    Gastrointestinal Gastrointestinal Symptoms Reported: No symptoms reported Additional Gastrointestinal Details: Patient states she is moving her bowels without issue after stopping Oxycodone       Genitourinary Genitourinary Symptoms Reported: No symptoms reported    Integumentary Integumentary Symptoms Reported: Incision Additional Integumentary Details: Patient states her sutures were removed at surgeon appt 03/06/24 Skin Management Strategies: Routine screening Skin Self-Management Outcome: 4 (good)  Musculoskeletal Musculoskelatal Symptoms Reviewed: No symptoms reported        Psychosocial Psychosocial Symptoms Reported: No symptoms reported         There were no vitals filed for this visit. Pain Scale: 0-10 Pain Score: 2  Pain Type: Surgical pain Pain Location: Shoulder Pain Orientation: Left Pain Descriptors / Indicators: Sore, Heaviness Pain Onset: With Activity Patients Stated Pain Goal: 2  Medications Reviewed Today     Reviewed by Lauro Shona LABOR, RN (Registered Nurse)  on 03/10/24 at 1515  Med List Status: <None>   Medication Order Taking? Sig Documenting Provider Last Dose Status Informant  acetaminophen  (TYLENOL ) 500 MG tablet [id number] Yes Take 1-2 tablets (500-1,000 mg total)  by mouth every 6 (six) hours as needed. Barrett, Rocky SAUNDERS, PA-C  Active   albuterol  (VENTOLIN  HFA) 108 (90 Base) MCG/ACT inhaler [id number] Yes Inhale 2 puffs into the lungs every 6 (six) hours as needed for wheezing or shortness of breath. McGowen, Philip H, MD  Active Self  atorvastatin  (LIPITOR) 20 MG tablet [id number] Yes Take 1 tablet (20 mg total) by mouth daily. McGowen, Philip H, MD  Active   Calcium  Carbonate (CALCIUM  600 PO) [id number] Yes Take 600 mg by mouth daily. [provider]  Active Self  Ciclopirox  1 % shampoo [id number] Yes Apply qod McGowen, Aleene DEL, MD  Active Self  Cyanocobalamin  (B-12) 1000 MCG TABS [id number] Yes Take 1,000 mcg by mouth daily. [provider]  Active   ELIDEL 1 % cream [id number]  Apply 1 Application topically daily as needed (irritation).  Patient not taking: Reported on 03/10/2024   [provider]  Active Self  gabapentin  (NEURONTIN ) 300 MG capsule [id number]  Take 1 capsule (300 mg total) by mouth at bedtime.  Patient not taking: Reported on 03/10/2024   Barrett, Rocky SAUNDERS, PA-C  Active   hydrochlorothiazide  (HYDRODIURIL ) 25 MG tablet [id number] Yes Take 1 tablet (25 mg total) by mouth daily. McGowen, Philip H, MD  Active Self  loratadine (CLARITIN) 10 MG tablet [id number] Yes Take 10 mg by mouth daily. [provider]  Active Self  Multiple Vitamins-Minerals (PRESERVISION AREDS 2+MULTI VIT PO) [id number] Yes Take 1 capsule by mouth daily. [provider]  Active Self  oxyCODONE  (OXY IR/ROXICODONE ) 5 MG immediate release tablet [id number] Yes Take 1 tablet (5 mg total) by mouth every 4 (four) hours as needed for moderate pain (pain score 4-6). Barrett, Rocky SAUNDERS, PA-C  Active   pantoprazole  (PROTONIX ) 40 MG tablet [id number] Yes Take 1 tablet (40 mg total) by mouth daily. McGowen, Philip H, MD  Active Self  phentermine (ADIPEX-P) 37.5 MG tablet [id number]  Take 18.75 mg by mouth daily.  Patient not taking: Reported on 03/10/2024    [provider]  Active Self  saccharomyces boulardii (FLORASTOR) 250 MG capsule [id number] Yes Take 250 mg by mouth daily. [provider]  Active Self  zolpidem  (AMBIEN ) 5 MG tablet [id number]  TAKE 1 TABLET(5 MG) BY MOUTH AT BEDTIME AS NEEDED  Patient not taking: Reported on 03/10/2024   McGowen, Philip H, MD  Active Self            Recommendation:   Continue Current Plan of Care  Follow Up Plan:   Telephone follow up appointment date/time:  03/19/24 in the afternoon   Shona Prow RN, CCM [place]  VBCI-Population Health RN Care Manager [phone]     "

## 2024-03-10 NOTE — Patient Instructions (Signed)
 Visit Information  Thank you for taking time to visit with me today. Please don't hesitate to contact me if I can be of assistance to you before our next scheduled telephone appointment.  Our next appointment is by telephone on 03/19/24 in the afternoon  Following is a copy of your care plan:   Goals Addressed             This Visit's Progress    VBCI Transitions of Care (TOC) Care Plan       Problems:  Recent Hospitalization for treatment of Adenocarcinoma of left lung -  S/P Robotic Assisted Video Thoracoscopy with Left Lower Lobectomy   Goal:  Over the next 30 days, the patient will not experience hospital readmission  Interventions:  Transitions of Care: Doctor Visits  - discussed the importance of doctor visits Arranged PCP follow-up within 7 days -TOC scheduled though bookit - 03/05/24 with PCP - visit completed = Atorvastatin  added   Oncology: Assessment of understanding of oncology diagnosis:  Assessed patient understanding of cancer diagnosis and recommended treatment plan Reviewed upcoming provider appointments and treatment appointments Assessed available transportation to appointments and treatments. Has consistent/reliable transportation: Yes Assessed support system. Has consistent/reliable family or other support: Yes  Surgery (Robotic Assisted Video Thoracoscopy with Left Lower Lobectomy) Evaluation of current treatment plan related to left lower lobectomy reviewed post-operative instructions with patient/caregiver reviewed medications with patient and addressed questions reviewed scheduled provider appointments with patient appt scheduled with surgeon, Dr Shyrl 03/06/24- completed - sutures removed - patient denies s/s infection- will now follow with oncology, Dr Sherrod 03/18/24 confirmed availability of transportation to all appointments patient denied any issues with transportation to appts  Patient Self Care Activities:  Attend all scheduled provider  appointments Call pharmacy for medication refills 3-7 days in advance of running out of medications Call provider office for new concerns or questions  Notify RN Care Manager of Plastic And Re[facility]ive Surgeons call rescheduling needs Participate in Transition of Care Program/Attend TOC scheduled calls Take medications as prescribed   Monitor incisions and report any s/s infection  Notify provider of worsening pain - 03/10/24 patient reports this has improved Notify provider of worsening constipation - 03/10/24 patient reports this has improved  Plan:  Telephone follow up appointment with care management team member scheduled for:  03/19/24 in the afternoon The patient has been provided with contact information for the care management team and has been advised to call with any health related questions or concerns.         Patient verbalizes understanding of instructions and care plan provided today and agrees to view in MyChart. Active MyChart status and patient understanding of how to access instructions and care plan via MyChart confirmed with patient.     Telephone follow up appointment with care management team member scheduled for: 03/19/24 The patient has been provided with contact information for the care management team and has been advised to call with any health related questions or concerns.   Please call the care guide team at [phone] if you need to cancel or reschedule your appointment.   Please call the Suicide and Crisis Lifeline: 988 call 1-800-273-TALK (toll free, 24 hour hotline) call 911 if you are experiencing a Mental Health or Behavioral Health Crisis or need someone to talk to.  Shona Prow RN, CCM [place]  VBCI-Population Health RN Care Manager [phone]

## 2024-03-14 ENCOUNTER — Other Ambulatory Visit: Payer: Self-pay

## 2024-03-14 DIAGNOSIS — C3492 Malignant neoplasm of unspecified part of left bronchus or lung: Secondary | ICD-10-CM

## 2024-03-18 ENCOUNTER — Encounter (HOSPITAL_COMMUNITY): Payer: Self-pay

## 2024-03-18 ENCOUNTER — Inpatient Hospital Stay

## 2024-03-18 ENCOUNTER — Inpatient Hospital Stay: Admitting: Internal Medicine

## 2024-03-18 VITALS — BP 144/77 | HR 99 | Temp 97.9°F | Resp 17 | Ht 65.0 in | Wt 190.0 lb

## 2024-03-18 DIAGNOSIS — C3490 Malignant neoplasm of unspecified part of unspecified bronchus or lung: Secondary | ICD-10-CM

## 2024-03-18 DIAGNOSIS — C3492 Malignant neoplasm of unspecified part of left bronchus or lung: Secondary | ICD-10-CM

## 2024-03-18 LAB — CBC WITH DIFFERENTIAL (CANCER CENTER ONLY)
Abs Immature Granulocytes: 0.01 10*3/uL (ref 0.00–0.07)
Basophils Absolute: 0.1 10*3/uL (ref 0.0–0.1)
Basophils Relative: 1 %
Eosinophils Absolute: 0 10*3/uL (ref 0.0–0.5)
Eosinophils Relative: 0 %
HCT: 38.9 % (ref 36.0–46.0)
Hemoglobin: 12.6 g/dL (ref 12.0–15.0)
Immature Granulocytes: 0 %
Lymphocytes Relative: 26 %
Lymphs Abs: 2.1 10*3/uL (ref 0.7–4.0)
MCH: 29.2 pg (ref 26.0–34.0)
MCHC: 32.4 g/dL (ref 30.0–36.0)
MCV: 90.3 fL (ref 80.0–100.0)
Monocytes Absolute: 0.5 10*3/uL (ref 0.1–1.0)
Monocytes Relative: 6 %
Neutro Abs: 5.4 10*3/uL (ref 1.7–7.7)
Neutrophils Relative %: 67 %
Platelet Count: 395 10*3/uL (ref 150–400)
RBC: 4.31 MIL/uL (ref 3.87–5.11)
RDW: 13.1 % (ref 11.5–15.5)
WBC Count: 8.1 10*3/uL (ref 4.0–10.5)
nRBC: 0 % (ref 0.0–0.2)

## 2024-03-18 LAB — CMP (CANCER CENTER ONLY)
ALT: 15 U/L (ref 0–44)
AST: 16 U/L (ref 15–41)
Albumin: 4.2 g/dL (ref 3.5–5.0)
Alkaline Phosphatase: 120 U/L (ref 38–126)
Anion gap: 9 (ref 5–15)
BUN: 14 mg/dL (ref 8–23)
CO2: 31 mmol/L (ref 22–32)
Calcium: 9.6 mg/dL (ref 8.9–10.3)
Chloride: 99 mmol/L (ref 98–111)
Creatinine: 0.63 mg/dL (ref 0.44–1.00)
GFR, Estimated: >60 mL/min
Glucose, Bld: 95 mg/dL (ref 70–99)
Potassium: 4.2 mmol/L (ref 3.5–5.1)
Sodium: 140 mmol/L (ref 135–145)
Total Bilirubin: 0.3 mg/dL (ref 0.0–1.2)
Total Protein: 7.4 g/dL (ref 6.5–8.1)

## 2024-03-19 ENCOUNTER — Telehealth: Payer: Self-pay

## 2024-03-19 ENCOUNTER — Ambulatory Visit: Admitting: Emergency Medicine

## 2024-03-19 NOTE — Transitions of Care (Post Inpatient/ED Visit) (Signed)
 " Transition of Care week 3  Visit Note  03/19/2024  Name: Andrea Newton MRN: [id number]          DOB: [birth year]  Situation: Patient enrolled in [facility] 30-day program. Visit completed with patient by telephone.   Background: Admit/Discharge Date:   1/12 - 1/13  Jolynn Pack   Primary Diagnosis:  Adenocarcinoma of left lung -  S/P Robotic Assisted Video Thoracoscopy with Left Lower Lobectomy   Initial Transition Care Management Follow-up Telephone Call Discharge Date and Diagnosis: 02/26/24, Adenocarcinoma of left lung -  S/P Robotic Assisted Video Thoracoscopy with Left Lower Lobectomy   Past Medical History:  Diagnosis Date   Bleeding internal hemorrhoids 2016   Borderline hypercholesterolemia 2019   10 yr [place] cv risk = 4.5%. 10/2020 fmhm cv risk=6%.  October 2025 risk 8%.   Constipation    Essential hypertension    GERD (gastroesophageal reflux disease)    History of adenomatous polyp of colon 04/2014   x 1: recall 5 yrs ([facility])   Infectious mononucleosis hepatitis college   Localized osteoporosis of spine    Mild intermittent asthma    Multiple thyroid  nodules 2010 approx; 2015   Euthyroid.  Saw ENDO (Dr. Aura).  Repeat thyroid  u/s 12/2013 showed thyromegaly with multiple nodules meeting biopsy criteria--sent pt to Dr. Trixie and plan for repeat u/s (09/2014) and one nodule had grown so bx -->benign.  Labs 03/2014 showed elevted TPO ab, supportive of Hashimoto's thyroiditis--a reassuring finding.  Pt euthyroid. 11/2018 u/s->stable. Annual TFT's with me, 2 yr f/u Dr. Paschal   Osteoarthritis    knees>hips.  Takes no meds.   Osteopenia 2017; 2019; 03/2020   Stable DEXA results 2017-2019. T score -2.18 Mar 2020. Rpt 2024.   Postmenopausal status 04/2010   FSH and LH elevated, estrogen low.   Primary lung adenocarcinoma (HCC)    left lower lobe--->lobectomy 02/2024 (neg margins, neg LN's)   Recurrent UTI     Assessment: Patient Reported Symptoms: Cognitive  Cognitive Status: No symptoms reported, Normal speech and language skills, Alert and oriented to person, place, and time      Neurological Neurological Review of Symptoms: No symptoms reported    HEENT HEENT Symptoms Reported: No symptoms reported      Cardiovascular Cardiovascular Symptoms Reported: No symptoms reported    Respiratory Respiratory Symptoms Reported: Shortness of breath Other Respiratory Symptoms: Patient states she has had some continued shortness of breath with exertion but slowing improving    Endocrine Endocrine Symptoms Reported: No symptoms reported    Gastrointestinal Gastrointestinal Symptoms Reported: No symptoms reported      Genitourinary Genitourinary Symptoms Reported: No symptoms reported    Integumentary Integumentary Symptoms Reported: Incision Additional Integumentary Details: Patient denies any issues with incision    Musculoskeletal Musculoskelatal Symptoms Reviewed: No symptoms reported        Psychosocial Psychosocial Symptoms Reported: No symptoms reported         There were no vitals filed for this visit. Pain Scale: 0-10 Pain Score: 5  (patient states her pain is improving but can still get up to about 5/10 with activity - goes down with pain med) Pain Type: Surgical pain Pain Location: Shoulder Pain Orientation: Left Pain Descriptors / Indicators: Sore Pain Onset: With Activity Patients Stated Pain Goal: 2  Medications Reviewed Today     Reviewed by Lauro Shona LABOR, RN (Registered Nurse) on 03/19/24 at 1503  Med List Status: <None>   Medication Order Taking? Sig Documenting Provider  Last Dose Status Informant  acetaminophen  (TYLENOL ) 500 MG tablet [id number] Yes Take 1-2 tablets (500-1,000 mg total) by mouth every 6 (six) hours as needed. Barrett, Rocky SAUNDERS, PA-C  Active   albuterol  (VENTOLIN  HFA) 108 (90 Base) MCG/ACT inhaler [id number] Yes Inhale 2 puffs into the lungs every 6 (six) hours as needed for wheezing or shortness of  breath. McGowen, Philip H, MD  Active Self  atorvastatin  (LIPITOR) 20 MG tablet [id number] Yes Take 1 tablet (20 mg total) by mouth daily. McGowen, Philip H, MD  Active   Calcium  Carbonate (CALCIUM  600 PO) [id number] Yes Take 600 mg by mouth daily. [provider]  Active Self  Ciclopirox  1 % shampoo [id number] Yes Apply qod McGowen, Aleene DEL, MD  Active Self  Cyanocobalamin  (B-12) 1000 MCG TABS [id number] Yes Take 1,000 mcg by mouth daily. [provider]  Active   ELIDEL 1 % cream [id number]  Apply 1 Application topically daily as needed (irritation).  Patient not taking: Reported on 03/19/2024   [provider]  Active Self  gabapentin  (NEURONTIN ) 300 MG capsule [id number] Yes Take 1 capsule (300 mg total) by mouth at bedtime. Barrett, Rocky SAUNDERS, PA-C  Active   hydrochlorothiazide  (HYDRODIURIL ) 25 MG tablet [id number] Yes Take 1 tablet (25 mg total) by mouth daily. McGowen, Philip H, MD  Active Self  loratadine (CLARITIN) 10 MG tablet [id number] Yes Take 10 mg by mouth daily. [provider]  Active Self  Multiple Vitamins-Minerals (PRESERVISION AREDS 2+MULTI VIT PO) [id number] Yes Take 1 capsule by mouth daily. [provider]  Active Self  oxyCODONE  (OXY IR/ROXICODONE ) 5 MG immediate release tablet [id number] Yes Take 1 tablet (5 mg total) by mouth every 4 (four) hours as needed for moderate pain (pain score 4-6). Barrett, Rocky SAUNDERS, PA-C  Active   pantoprazole  (PROTONIX ) 40 MG tablet [id number] Yes Take 1 tablet (40 mg total) by mouth daily. McGowen, Philip H, MD  Active Self  phentermine (ADIPEX-P) 37.5 MG tablet [id number]  Take 18.75 mg by mouth daily.  Patient not taking: Reported on 03/19/2024   [provider]  Active Self  saccharomyces boulardii (FLORASTOR) 250 MG capsule [id number] Yes Take 250 mg by mouth daily. [provider]  Active Self  zolpidem  (AMBIEN ) 5 MG tablet [id number] Yes TAKE 1 TABLET(5 MG) BY MOUTH AT BEDTIME AS NEEDED McGowen,  Aleene DEL, MD  Active Self            Recommendation:   Continue Current Plan of Care  Follow Up Plan:   Telephone follow up appointment date/time:  03/26/24 in the afternoon  Shona Prow RN, CCM [place]  VBCI-Population Health RN Care Manager [phone]     "

## 2024-03-19 NOTE — Patient Instructions (Signed)
 Visit Information  Thank you for taking time to visit with me today. Please don't hesitate to contact me if I can be of assistance to you before our next scheduled telephone appointment.  Our next appointment is by telephone on 03/26/24 in the afternoon   Following is a copy of your care plan:   Goals Addressed             This Visit's Progress    VBCI Transitions of Care (TOC) Care Plan       Problems:  Recent Hospitalization for treatment of Adenocarcinoma of left lung -  S/P Robotic Assisted Video Thoracoscopy with Left Lower Lobectomy   Goal:  Over the next 30 days, the patient will not experience hospital readmission  Interventions:  Transitions of Care: Doctor Visits  - discussed the importance of doctor visits Arranged PCP follow-up within 7 days -TOC scheduled though bookit - 03/05/24 with PCP - visit completed = Atorvastatin  added  03/19/24 Patient states she saw is doing well and is about to go out with her husband and requested a short call.   Oncology: Assessment of understanding of oncology diagnosis:  Assessed patient understanding of cancer diagnosis and recommended treatment plan Reviewed upcoming provider appointments and treatment appointments Assessed available transportation to appointments and treatments. Has consistent/reliable transportation: Yes Assessed support system. Has consistent/reliable family or other support: Yes Update 03/19/24: patient very pleased after her appt with oncology - plan will be to have MRI and wait for marker results and if those are good will not be having chemo/ration and will be monitored   Surgery (Robotic Assisted Video Thoracoscopy with Left Lower Lobectomy) Evaluation of current treatment plan related to left lower lobectomy reviewed post-operative instructions with patient/caregiver reviewed medications with patient and addressed questions reviewed scheduled provider appointments with patient appt scheduled with surgeon, Dr  Shyrl 03/06/24- completed - sutures removed - patient denies s/s infection- will now follow with oncology, Dr Sherrod 03/18/24 confirmed availability of transportation to all appointments patient denied any issues with transportation to appts Update 03/19/24: Patient states incision looks good and she is doing well - denied any s/s of infection  Patient Self Care Activities:  Attend all scheduled provider appointments Call pharmacy for medication refills 3-7 days in advance of running out of medications Call provider office for new concerns or questions  Notify RN Care Manager of [facility] call rescheduling needs Participate in Transition of Care Program/Attend TOC scheduled calls Take medications as prescribed   Monitor incisions and report any s/s infection  Notify provider of worsening pain - 03/10/24 patient reports this has improved Notify provider of worsening constipation - 03/10/24 patient reports this has improved  Plan:  Telephone follow up appointment with care management team member scheduled for:  03/26/24 in the afternoon The patient has been provided with contact information for the care management team and has been advised to call with any health related questions or concerns.         Patient verbalizes understanding of instructions and care plan provided today and agrees to view in MyChart. Active MyChart status and patient understanding of how to access instructions and care plan via MyChart confirmed with patient.     Telephone follow up appointment with care management team member scheduled for:03/26/24 The patient has been provided with contact information for the care management team and has been advised to call with any health related questions or concerns.   Please call the care guide team at [phone] if you need  to cancel or reschedule your appointment.   Please call the Suicide and Crisis Lifeline: 988 call 1-800-273-TALK (toll free, 24 hour hotline) call 911 if you  are experiencing a Mental Health or Behavioral Health Crisis or need someone to talk to.  Shona Prow RN, CCM [place]  VBCI-Population Health RN Care Manager [phone]

## 2024-03-20 ENCOUNTER — Encounter (HOSPITAL_COMMUNITY): Payer: Self-pay

## 2024-03-27 ENCOUNTER — Inpatient Hospital Stay: Admitting: Genetic Counselor

## 2024-03-27 ENCOUNTER — Inpatient Hospital Stay

## 2024-03-28 ENCOUNTER — Ambulatory Visit (HOSPITAL_COMMUNITY)

## 2024-04-07 ENCOUNTER — Ambulatory Visit

## 2024-05-29 ENCOUNTER — Ambulatory Visit: Admitting: Family Medicine

## 2024-10-09 ENCOUNTER — Inpatient Hospital Stay

## 2024-10-16 ENCOUNTER — Inpatient Hospital Stay: Admitting: Internal Medicine
